# Patient Record
Sex: Female | Born: 1945
Health system: Southern US, Community
[De-identification: ages and names within clinical notes are randomized; demographics above are authoritative.]

## PROBLEM LIST (undated history)

## (undated) DIAGNOSIS — M1990 Unspecified osteoarthritis, unspecified site: Secondary | ICD-10-CM

## (undated) DIAGNOSIS — Z5189 Encounter for other specified aftercare: Secondary | ICD-10-CM

## (undated) DIAGNOSIS — K5900 Constipation, unspecified: Secondary | ICD-10-CM

## (undated) DIAGNOSIS — I712 Thoracic aortic aneurysm, without rupture, unspecified: Secondary | ICD-10-CM

## (undated) DIAGNOSIS — J189 Pneumonia, unspecified organism: Secondary | ICD-10-CM

## (undated) DIAGNOSIS — D649 Anemia, unspecified: Secondary | ICD-10-CM

## (undated) DIAGNOSIS — I10 Essential (primary) hypertension: Secondary | ICD-10-CM

## (undated) DIAGNOSIS — I359 Nonrheumatic aortic valve disorder, unspecified: Secondary | ICD-10-CM

## (undated) HISTORY — DX: Thoracic aortic aneurysm, without rupture, unspecified: I71.20

## (undated) HISTORY — DX: Encounter for other specified aftercare: Z51.89

## (undated) HISTORY — DX: Nonrheumatic aortic valve disorder, unspecified: I35.9

## (undated) HISTORY — DX: Thoracic aortic aneurysm, without rupture: I71.2

## (undated) HISTORY — DX: Essential (primary) hypertension: I10

## (undated) HISTORY — PX: COLONOSCOPY: SHX174

---

## 1974-10-03 HISTORY — PX: TUBAL LIGATION: SHX77

## 1998-02-04 ENCOUNTER — Other Ambulatory Visit: Admission: RE | Admit: 1998-02-04 | Discharge: 1998-02-04 | Payer: Self-pay | Admitting: Emergency Medicine

## 1998-02-06 ENCOUNTER — Other Ambulatory Visit: Admission: RE | Admit: 1998-02-06 | Discharge: 1998-02-06 | Payer: Self-pay | Admitting: Emergency Medicine

## 1999-02-09 ENCOUNTER — Other Ambulatory Visit: Admission: RE | Admit: 1999-02-09 | Discharge: 1999-02-09 | Payer: Self-pay | Admitting: Emergency Medicine

## 2000-03-13 ENCOUNTER — Encounter: Admission: RE | Admit: 2000-03-13 | Discharge: 2000-03-13 | Payer: Self-pay | Admitting: Emergency Medicine

## 2000-03-13 ENCOUNTER — Encounter: Payer: Self-pay | Admitting: Emergency Medicine

## 2000-08-29 ENCOUNTER — Other Ambulatory Visit: Admission: RE | Admit: 2000-08-29 | Discharge: 2000-08-29 | Payer: Self-pay | Admitting: Emergency Medicine

## 2001-04-18 ENCOUNTER — Encounter: Admission: RE | Admit: 2001-04-18 | Discharge: 2001-04-18 | Payer: Self-pay | Admitting: Emergency Medicine

## 2001-04-18 ENCOUNTER — Encounter: Payer: Self-pay | Admitting: Emergency Medicine

## 2002-04-22 ENCOUNTER — Encounter: Admission: RE | Admit: 2002-04-22 | Discharge: 2002-04-22 | Payer: Self-pay | Admitting: Emergency Medicine

## 2002-04-22 ENCOUNTER — Encounter: Payer: Self-pay | Admitting: Emergency Medicine

## 2004-02-25 ENCOUNTER — Encounter: Admission: RE | Admit: 2004-02-25 | Discharge: 2004-02-25 | Payer: Self-pay | Admitting: Emergency Medicine

## 2004-04-08 ENCOUNTER — Encounter: Payer: Self-pay | Admitting: Internal Medicine

## 2005-03-10 ENCOUNTER — Encounter: Admission: RE | Admit: 2005-03-10 | Discharge: 2005-03-10 | Payer: Self-pay | Admitting: Emergency Medicine

## 2006-03-30 ENCOUNTER — Encounter: Admission: RE | Admit: 2006-03-30 | Discharge: 2006-03-30 | Payer: Self-pay | Admitting: Emergency Medicine

## 2007-04-05 ENCOUNTER — Encounter: Admission: RE | Admit: 2007-04-05 | Discharge: 2007-04-05 | Payer: Self-pay | Admitting: Emergency Medicine

## 2007-04-09 ENCOUNTER — Encounter: Admission: RE | Admit: 2007-04-09 | Discharge: 2007-04-09 | Payer: Self-pay | Admitting: Emergency Medicine

## 2007-08-10 DIAGNOSIS — I517 Cardiomegaly: Secondary | ICD-10-CM

## 2007-09-04 ENCOUNTER — Encounter: Admission: RE | Admit: 2007-09-04 | Discharge: 2007-09-04 | Payer: Self-pay | Admitting: Emergency Medicine

## 2007-09-05 DIAGNOSIS — D179 Benign lipomatous neoplasm, unspecified: Secondary | ICD-10-CM | POA: Insufficient documentation

## 2007-09-12 ENCOUNTER — Ambulatory Visit: Payer: Self-pay | Admitting: Hematology and Oncology

## 2007-09-13 ENCOUNTER — Encounter: Admission: RE | Admit: 2007-09-13 | Discharge: 2007-09-13 | Payer: Self-pay | Admitting: Emergency Medicine

## 2007-09-14 ENCOUNTER — Ambulatory Visit: Payer: Self-pay | Admitting: Internal Medicine

## 2007-09-14 LAB — CONVERTED CEMR LAB
ALT: 49 units/L — ABNORMAL HIGH (ref 0–35)
Albumin: 2.5 g/dL — ABNORMAL LOW (ref 3.5–5.2)
Alkaline Phosphatase: 159 units/L — ABNORMAL HIGH (ref 39–117)
Basophils Absolute: 0.1 10*3/uL (ref 0.0–0.1)
Basophils Relative: 0.8 % (ref 0.0–1.0)
Eosinophils Relative: 2.7 % (ref 0.0–5.0)
HCT: 32.6 % — ABNORMAL LOW (ref 36.0–46.0)
Hemoglobin: 10.8 g/dL — ABNORMAL LOW (ref 12.0–15.0)
INR: 1.1 — ABNORMAL HIGH (ref 0.8–1.0)
MCHC: 33.2 g/dL (ref 30.0–36.0)
MCV: 80.7 fL (ref 78.0–100.0)
Neutro Abs: 14.4 10*3/uL — ABNORMAL HIGH (ref 1.4–7.7)
Prothrombin Time: 12.6 s (ref 10.9–13.3)
Total Bilirubin: 0.7 mg/dL (ref 0.3–1.2)
WBC: 17.4 10*3/uL — ABNORMAL HIGH (ref 4.5–10.5)

## 2007-09-19 LAB — CBC WITH DIFFERENTIAL/PLATELET
BASO%: 0.3 % (ref 0.0–2.0)
EOS%: 3.5 % (ref 0.0–7.0)
Eosinophils Absolute: 0.4 10*3/uL (ref 0.0–0.5)
HCT: 28.1 % — ABNORMAL LOW (ref 34.8–46.6)
HGB: 9.4 g/dL — ABNORMAL LOW (ref 11.6–15.9)
MCHC: 33.3 g/dL (ref 32.0–36.0)
MCV: 78.7 fL — ABNORMAL LOW (ref 81.0–101.0)
MONO#: 0.7 10*3/uL (ref 0.1–0.9)
NEUT#: 8.2 10*3/uL — ABNORMAL HIGH (ref 1.5–6.5)
NEUT%: 78.5 % — ABNORMAL HIGH (ref 39.6–76.8)
Platelets: 541 10*3/uL — ABNORMAL HIGH (ref 145–400)
RBC: 3.57 10*6/uL — ABNORMAL LOW (ref 3.70–5.32)
RDW: 15.9 % — ABNORMAL HIGH (ref 11.3–14.5)

## 2007-09-19 LAB — IRON AND TIBC
%SAT: 9 % — ABNORMAL LOW (ref 20–55)
Iron: 18 ug/dL — ABNORMAL LOW (ref 42–145)

## 2007-09-19 LAB — MORPHOLOGY: PLT EST: INCREASED

## 2007-09-19 LAB — FERRITIN: Ferritin: 915 ng/mL — ABNORMAL HIGH (ref 10–291)

## 2007-09-21 DIAGNOSIS — I10 Essential (primary) hypertension: Secondary | ICD-10-CM

## 2007-09-21 DIAGNOSIS — R5383 Other fatigue: Secondary | ICD-10-CM

## 2007-09-21 DIAGNOSIS — R945 Abnormal results of liver function studies: Secondary | ICD-10-CM

## 2007-09-21 DIAGNOSIS — B2790 Infectious mononucleosis, unspecified without complication: Secondary | ICD-10-CM

## 2007-09-21 DIAGNOSIS — R5381 Other malaise: Secondary | ICD-10-CM

## 2007-09-21 LAB — PROTEIN ELECTROPHORESIS, SERUM
Albumin ELP: 37.7 % — ABNORMAL LOW (ref 55.8–66.1)
Alpha-2-Globulin: 19.4 % — ABNORMAL HIGH (ref 7.1–11.8)
Gamma Globulin: 18.5 % (ref 11.1–18.8)
Total Protein, Serum Electrophoresis: 6.9 g/dL (ref 6.0–8.3)

## 2007-09-21 LAB — COMPREHENSIVE METABOLIC PANEL
BUN: 13 mg/dL (ref 6–23)
CO2: 26 mEq/L (ref 19–32)
Calcium: 9.1 mg/dL (ref 8.4–10.5)

## 2007-09-28 ENCOUNTER — Ambulatory Visit: Payer: Self-pay | Admitting: Internal Medicine

## 2007-10-04 DIAGNOSIS — Z5189 Encounter for other specified aftercare: Secondary | ICD-10-CM

## 2007-10-04 HISTORY — DX: Encounter for other specified aftercare: Z51.89

## 2007-11-28 ENCOUNTER — Ambulatory Visit: Payer: Self-pay | Admitting: Internal Medicine

## 2007-11-28 LAB — CONVERTED CEMR LAB
Basophils Absolute: 0.1 10*3/uL (ref 0.0–0.1)
Eosinophils Absolute: 0.6 10*3/uL (ref 0.0–0.6)
Eosinophils Relative: 6.8 % — ABNORMAL HIGH (ref 0.0–5.0)
MCHC: 31.5 g/dL (ref 30.0–36.0)
MCV: 77.6 fL — ABNORMAL LOW (ref 78.0–100.0)
Monocytes Relative: 8.4 % (ref 3.0–11.0)
Neutro Abs: 5.8 10*3/uL (ref 1.4–7.7)
Neutrophils Relative %: 60 % (ref 43.0–77.0)

## 2007-11-29 ENCOUNTER — Encounter: Payer: Self-pay | Admitting: Internal Medicine

## 2007-11-29 ENCOUNTER — Ambulatory Visit: Payer: Self-pay | Admitting: Internal Medicine

## 2007-12-07 ENCOUNTER — Ambulatory Visit: Payer: Self-pay | Admitting: Internal Medicine

## 2007-12-13 ENCOUNTER — Ambulatory Visit: Payer: Self-pay | Admitting: Internal Medicine

## 2007-12-13 LAB — CONVERTED CEMR LAB
Basophils Relative: 0.9 % (ref 0.0–1.0)
Eosinophils Absolute: 0.3 10*3/uL (ref 0.0–0.6)
HCT: 31.5 % — ABNORMAL LOW (ref 36.0–46.0)
Lymphocytes Relative: 19.4 % (ref 12.0–46.0)
MCV: 78.2 fL (ref 78.0–100.0)
Monocytes Absolute: 0.5 10*3/uL (ref 0.2–0.7)
Neutro Abs: 5.2 10*3/uL (ref 1.4–7.7)
RBC: 4.02 M/uL (ref 3.87–5.11)

## 2007-12-26 ENCOUNTER — Encounter (HOSPITAL_COMMUNITY): Admission: RE | Admit: 2007-12-26 | Discharge: 2008-03-25 | Payer: Self-pay | Admitting: Internal Medicine

## 2008-01-30 ENCOUNTER — Ambulatory Visit: Payer: Self-pay | Admitting: Internal Medicine

## 2008-02-01 LAB — CONVERTED CEMR LAB
Eosinophils Absolute: 0.3 10*3/uL (ref 0.0–0.7)
HCT: 33.5 % — ABNORMAL LOW (ref 36.0–46.0)
Hemoglobin: 10.9 g/dL — ABNORMAL LOW (ref 12.0–15.0)
Lymphocytes Relative: 25.4 % (ref 12.0–46.0)
Monocytes Absolute: 0.6 10*3/uL (ref 0.1–1.0)
Neutro Abs: 4.4 10*3/uL (ref 1.4–7.7)
Platelets: 269 10*3/uL (ref 150–400)
RBC: 4.14 M/uL (ref 3.87–5.11)
RDW: 18.4 % — ABNORMAL HIGH (ref 11.5–14.6)
WBC: 7.3 10*3/uL (ref 4.5–10.5)

## 2008-04-07 ENCOUNTER — Ambulatory Visit: Payer: Self-pay | Admitting: Internal Medicine

## 2008-04-07 LAB — CONVERTED CEMR LAB
Lymphocytes Relative: 22.3 % (ref 12.0–46.0)
MCV: 85.2 fL (ref 78.0–100.0)
Platelets: 234 10*3/uL (ref 150–400)
RBC: 4.23 M/uL (ref 3.87–5.11)
WBC: 6.8 10*3/uL (ref 4.5–10.5)

## 2008-04-10 ENCOUNTER — Encounter: Admission: RE | Admit: 2008-04-10 | Discharge: 2008-04-10 | Payer: Self-pay | Admitting: Emergency Medicine

## 2008-07-10 ENCOUNTER — Ambulatory Visit: Payer: Self-pay | Admitting: Internal Medicine

## 2008-07-10 LAB — CONVERTED CEMR LAB
Basophils Relative: 1.3 % (ref 0.0–3.0)
HCT: 38.8 % (ref 36.0–46.0)
Hemoglobin: 13.2 g/dL (ref 12.0–15.0)
MCHC: 34 g/dL (ref 30.0–36.0)
MCV: 85.1 fL (ref 78.0–100.0)
Neutrophils Relative %: 59.8 % (ref 43.0–77.0)
Platelets: 229 10*3/uL (ref 150–400)

## 2009-04-10 ENCOUNTER — Encounter: Admission: RE | Admit: 2009-04-10 | Discharge: 2009-04-10 | Payer: Self-pay | Admitting: Internal Medicine

## 2009-06-17 ENCOUNTER — Encounter: Admission: RE | Admit: 2009-06-17 | Discharge: 2009-06-17 | Payer: Self-pay | Admitting: Internal Medicine

## 2010-01-14 DIAGNOSIS — D126 Benign neoplasm of colon, unspecified: Secondary | ICD-10-CM | POA: Insufficient documentation

## 2010-01-14 DIAGNOSIS — I1310 Hypertensive heart and chronic kidney disease without heart failure, with stage 1 through stage 4 chronic kidney disease, or unspecified chronic kidney disease: Secondary | ICD-10-CM | POA: Insufficient documentation

## 2010-01-14 DIAGNOSIS — M1990 Unspecified osteoarthritis, unspecified site: Secondary | ICD-10-CM | POA: Insufficient documentation

## 2010-04-12 ENCOUNTER — Encounter: Admission: RE | Admit: 2010-04-12 | Discharge: 2010-04-12 | Payer: Self-pay | Admitting: Internal Medicine

## 2011-02-15 NOTE — Assessment & Plan Note (Signed)
[facility]                         GASTROENTEROLOGY OFFICE NOTE   MALALA, TRENKAMP                         MRN:          [id number]  DATE:09/14/2007                            DOB:          1946-03-29    REFERRING PHYSICIAN:  Dr. Lorenz Coaster.   PROBLEMS:  1. Elevated liver function studies.  2. Severe fatigue.   HISTORY:  Shalaina is a pleasant 65 year old African-American female. She is  known remotely to Dr. Lina Sar who did screening colonoscopy in 2005.  This was a normal exam. The patient is generally healthy. She does have  a history of hypertension and is status post laparoscopic  cholecystectomy.   The patient states that her current illness started about six weeks ago  with onset of fatigue. She was then seen by Dr. Lorenz Coaster and says that she  was initially given a treatment of antibiotics, which did not help her  symptoms. She had returned and was then told that she had a pneumonia.  She was treated with a course of Avelox and then a Z-Pak. She says that  after that, she has continued to feel poorly. She has had severe fatigue  limiting her daily activities, anorexia, and a weight loss of about 16  pounds since the onset of her symptoms. She has had some mild nausea,  but no vomiting, and says that she tries to force herself to eat. She  says that she has no taste.   On further questioning, she also says that she has had a coating on her  tongue. She has not had any documented fever or chills. She has had  headaches over the past month and occasional night sweats. She denies  any myalgias or arthralgias. She did have a cough and apparently some  sputum production several weeks ago which has resolved for the most  part. She denies any dysphagia or odynophagia currently. She says that  she has some tenderness in her neck anteriorly on the right and says  that she has trouble because the food gets bigger in her mouth, but no  actual trouble  with deglutition or dysphagia. She has not had any  diarrhea, melena, or hematochezia. She has had undergone a CT scan of  the abdomen and pelvis on 09/05/2007. She has what appears to be a lipoma  beneath the right sternocleidomastoid muscle. A CT of the abdomen and  pelvis was negative. Chest x-ray on 11/7 was negative. The chest x-ray  on 11/7 showed mild cardiomegaly, small bilateral effusions and basilar  infiltrates.   LABORATORY DATA:  12/2 WBC 14, hemoglobin 11.5, hematocrit 35.3, MCV 78.  She had 70% neutrophils. Lymphocyte count was normal. Creatinine 1.02.  Electrolytes within normal limits. Total bilirubin was 0.6, alkaline  phosphatase 249, SGOT 101, SGPT 131. She had hepatitis C serology done  which was negative. CMV serology was negative. Hepatitis B core antibody  negative. Epstein bar viral titers were done and were positive for the  EBV antibody , VCA-IgG, at 1403 and EBV nuclear antigen antibody IgG at  584. The EBV antibody, VCA-IgM, was 7  and the early antigen IgG was 53  and both of those were normal. This pattern is consistent with a  convalescent phase of EBV.   CURRENT MEDICATIONS:  1. Hydrochlorothiazide 25 daily.  2. Meclizine 12.5 t.i.d. p.r.n.  3. Ibuprofen 800 t.i.d. p.r.n.   ALLERGIES:  CODEINE.   PAST HISTORY:  Pertinent for, again, hypertension, bilateral tubal  ligation.   FAMILY HISTORY:  Negative for GI disease. Specifically negative for  liver disease.   SOCIAL HISTORY:  The patient is married. She has one child and lives  with her spouse. She is a Futures trader. She quit smoking in November 2008.  No ETOH.   REVIEW OF SYSTEMS:  As outlined above in detail.   PHYSICAL EXAMINATION:  GENERAL:  Well developed, thin African-American  female in no acute distress. She is fatigued appearing.  VITAL SIGNS:  Height 5 foot 3 inches, weight 140 pounds, blood pressure  98/60, pulse 84.  HEENT:  Normocephalic atraumatic. EOMI. PERRLA. Sclerae anicteric.   Oropharynx is clear. She has some mild pharyngeal erythema and the  tongue with a greenish-yellowish plaque.  NECK:  Supple. She does have a soft mass effect in the right anterior  cervical region, which may be the lipoma that was mentioned on the CT  scan. She also has some tenderness in the submandibular node area.  CARDIOVASCULAR:  Regular rate and rhythm with S1 and S2. No murmurs,  rubs, or gallops.  PULMONARY:  Clear to auscultation and percussion.  ABDOMEN:  Soft and nontender. There is no mass or hepatosplenomegaly.  RECTAL:  Not done.  EXTREMITIES:  Without cyanosis, clubbing, or edema. There is no rash or  excoriations.  NEUROLOGIC:  Non-focal.   IMPRESSION:  1. This is a 65 year old African-American female with a six week      history of fatigue, anorexia, weight loss, headaches, and elevated      LFTs with  EBV viral serologies now positive for a probable      convalescent phase of an active EBV infection.  2. Recent pneumonia, probably associated.   PLAN:  1. The patient appears to have an EBV hepatitis and treatment is      supportive management at this time which was discussed with the      patient. We will repeat her hepatic panel today and we will plan to      see her back in the office in approximately two weeks to repeat      liver tests again at that point. She was advised that it may take      several more weeks for her symptoms to improve and start clearing.  2. Possible oral candidiasis versus leukoplakia associated with the      EBV. She was given      Mycostatin oral suspension, 5 cc swish and swallow q.i.d. x2 weeks.      Hesitant to use Diflucan at this time with the active hepatitis.      Mike Gip, PA-C  Electronically Signed      Iva Boop, MD,FACG  Electronically Signed   AE/MedQ  DD: 09/14/2007  DT: 09/16/2007  Job #: [id number]   cc:   Reuben Likes, M.D.

## 2011-02-15 NOTE — Assessment & Plan Note (Signed)
[facility]                         GASTROENTEROLOGY OFFICE NOTE   GARNETTA, FEDRICK                         MRN:          [id number]  DATE:09/28/2007                            DOB:          09-20-1946    CHIEF COMPLAINT:  Followup of abnormal LFTs.   Ms. Gilbertson was seen by Mike Gip, PA-C on December 12.  She had some  abnormal LFTs.  It was the conclusion then given the overall clinical  situation (see that note) that she had resolving EBV hepatitis and viral  syndrome.  She is feeling better, gaining weight.  No more severe  fatigue, though she is having some exertional fatigue.  She saw Dr.  Dalene Carrow and was told her white blood cell count was okay, and she  believes her liver chemistries had normalized since our visit on  December 12.  Her medications are listed and reviewed in the chart  today, and include ibuprofen and centrum Silver.  The ibuprofen is  p.r.n.   PAST MEDICAL HISTORY:  Reviewed and unchanged.   VITAL SIGNS:  Weight 144 pounds, pulse 100, blood pressure 122/74.   ASSESSMENT:  This certainly looks like she has resolving Epstein-Barr  virus hepatitis.  The serologies indicated that her liver test  abnormalities were minimal at the time when we saw her on December 12.  She had a normal INR.  She had an alkaline phosphatase of 159 and an ALT  of 49.  Her white blood cell count was 17,000 that day with a left  shift, though she did not have atypical at that time.   PLAN:  1. Request copies of labs from Dr. Dalene Carrow.  2. Keep followup with Dr. Lorenz Coaster later on this month.  She certainly      should have repeat LFTs at that time to confirm that they were      normal.  3. If she has persistently abnormal LFTs, I would be happy to see her      back to consider further investigation, but would not do so at this      time.     Iva Boop, MD,FACG  Electronically Signed    CEG/MedQ  DD: 09/28/2007  DT: 09/28/2007  Job #:  [id number]   cc:   Reuben Likes, M.D.  Lauretta I. Odogwu, M.D.

## 2011-02-15 NOTE — Assessment & Plan Note (Signed)
[facility]                         GASTROENTEROLOGY OFFICE NOTE   MAURY, BAMBA                         MRN:          [id number]  DATE:11/28/2007                            DOB:          31-Aug-1946    Ms. Nancy Montoya is a 65 year old female referred by Dr. Lorenz Coaster because of iron  deficiency anemia, which was found on October 20, 2007.Her Hemoglobin  was 8.6, hematocrit 25.9 and her MCV 78.  Her BUN was normal at 13.  Ms.  Ledonne was seen by [place] in the past for screening colonoscopy at the  request of Dr. Lorenz Coaster.  Her colonoscopy on April 08, 2004 was a normal  exam without evidence of colon polyps.  Ms. Abila has been taking  ibuprofen 800 mg twice a day for a long time for right shoulder  discomfort and arthritis, which is work related.  She denies any  melena,but her stools recently have been dark due to her iron  supplements.  She denies any abdominal pain, indigestion. There has been  20 pound weight loss since she had a pneumonia and upper respiratory  infection  in November 2008.  She was on multiple antibiotics and was  feeling quite poorly for about two and a half months.  She has only now  started to maintain her weight and has gained one pound.   PHYSICAL EXAMINATION:  Blood pressure 110/76, pulse 76 and weight 142.2  pounds.  She was alert, oriented, in no distress.  The sclerae were  nonicteric.  Her oral cavity showed normal papillated tongue.  No  cheilosis.  LUNGS:  Clear to auscultation.  COR:  Normal S1, S2.  ABDOMEN:  Soft with tenderness in epigastrium in the midline.  No  tenderness in left or right upper quadrant or lower abdomen.  Bowel  sounds were normoactive.  There was no distention.  Liver edge at costal  margin.  RECTAL:  Soft Hemoccult negative stool.   IMPRESSION:  65 year old female with epigastric discomfort who  is on large doses of ibuprofen on a chronic basis, and has iron  deficiency anemia with microcytic  indices.  She had a normal colonoscopy  three and a half years ago.  I think that we are dealing with an NSAID  related gastropathy causing chronic low-grade GI blood loss.  She is  postmenopausal.  Her last period was about 13 years ago, so her blood  loss is most likely GI related.   PLAN:  1. Upper endoscopy scheduled.  2. Discontinue ibuprofen.  Patient said it is no problem for her to      stop taking it.  3. May take Tylenol p.r.n.  4. Pepcid 40 mg p.o. daily prescribed to [facility].  She      will be on it probably for several weeks just to heal the      gastropathy.  Depending on the upper endoscopy, we will      decide if she needs colonoscopy, but unless she has completely      normal upper endoscopic exam, I would not  think that the repeat      colonoscopy is indicated yet.     Hedwig Morton. Juanda Chance, MD  Electronically Signed    DMB/MedQ  DD: 11/28/2007  DT: 11/29/2007  Job #: [id number]   cc:   Reuben Likes, M.D.

## 2011-04-29 ENCOUNTER — Other Ambulatory Visit: Payer: Self-pay | Admitting: Internal Medicine

## 2011-04-29 DIAGNOSIS — Z1231 Encounter for screening mammogram for malignant neoplasm of breast: Secondary | ICD-10-CM

## 2011-05-02 ENCOUNTER — Ambulatory Visit
Admission: RE | Admit: 2011-05-02 | Discharge: 2011-05-02 | Disposition: A | Discharge status: Home / Self Care | Payer: Medicare Other | Source: Ambulatory Visit | Attending: Internal Medicine | Admitting: Internal Medicine

## 2011-05-02 DIAGNOSIS — Z1231 Encounter for screening mammogram for malignant neoplasm of breast: Secondary | ICD-10-CM

## 2012-05-15 ENCOUNTER — Other Ambulatory Visit: Payer: Self-pay | Admitting: Internal Medicine

## 2012-05-15 DIAGNOSIS — Z1231 Encounter for screening mammogram for malignant neoplasm of breast: Secondary | ICD-10-CM

## 2012-05-18 DIAGNOSIS — I10 Essential (primary) hypertension: Secondary | ICD-10-CM | POA: Diagnosis not present

## 2012-05-24 DIAGNOSIS — Z1212 Encounter for screening for malignant neoplasm of rectum: Secondary | ICD-10-CM | POA: Diagnosis not present

## 2012-05-31 ENCOUNTER — Ambulatory Visit
Admission: RE | Admit: 2012-05-31 | Discharge: 2012-05-31 | Disposition: A | Discharge status: Home / Self Care | Payer: Medicare Other | Source: Ambulatory Visit | Attending: Internal Medicine | Admitting: Internal Medicine

## 2012-05-31 DIAGNOSIS — Z1231 Encounter for screening mammogram for malignant neoplasm of breast: Secondary | ICD-10-CM

## 2012-05-31 DIAGNOSIS — M949 Disorder of cartilage, unspecified: Secondary | ICD-10-CM | POA: Diagnosis not present

## 2012-05-31 DIAGNOSIS — Z124 Encounter for screening for malignant neoplasm of cervix: Secondary | ICD-10-CM | POA: Diagnosis not present

## 2012-05-31 DIAGNOSIS — M899 Disorder of bone, unspecified: Secondary | ICD-10-CM | POA: Insufficient documentation

## 2012-05-31 DIAGNOSIS — Z0000 Encounter for general adult medical examination without abnormal findings: Secondary | ICD-10-CM | POA: Diagnosis not present

## 2012-05-31 DIAGNOSIS — I10 Essential (primary) hypertension: Secondary | ICD-10-CM | POA: Diagnosis not present

## 2012-05-31 DIAGNOSIS — M1990 Unspecified osteoarthritis, unspecified site: Secondary | ICD-10-CM | POA: Diagnosis not present

## 2012-05-31 DIAGNOSIS — Z9189 Other specified personal risk factors, not elsewhere classified: Secondary | ICD-10-CM | POA: Diagnosis not present

## 2012-06-05 DIAGNOSIS — I10 Essential (primary) hypertension: Secondary | ICD-10-CM | POA: Diagnosis not present

## 2012-06-05 DIAGNOSIS — J069 Acute upper respiratory infection, unspecified: Secondary | ICD-10-CM | POA: Diagnosis not present

## 2012-06-05 DIAGNOSIS — J029 Acute pharyngitis, unspecified: Secondary | ICD-10-CM | POA: Diagnosis not present

## 2012-06-18 DIAGNOSIS — J069 Acute upper respiratory infection, unspecified: Secondary | ICD-10-CM | POA: Diagnosis not present

## 2012-06-27 DIAGNOSIS — M949 Disorder of cartilage, unspecified: Secondary | ICD-10-CM | POA: Diagnosis not present

## 2012-06-27 DIAGNOSIS — M899 Disorder of bone, unspecified: Secondary | ICD-10-CM | POA: Diagnosis not present

## 2012-06-27 DIAGNOSIS — Z23 Encounter for immunization: Secondary | ICD-10-CM | POA: Diagnosis not present

## 2012-06-28 DIAGNOSIS — H25019 Cortical age-related cataract, unspecified eye: Secondary | ICD-10-CM | POA: Diagnosis not present

## 2012-11-20 DIAGNOSIS — S51809A Unspecified open wound of unspecified forearm, initial encounter: Secondary | ICD-10-CM | POA: Diagnosis not present

## 2012-12-04 ENCOUNTER — Encounter: Payer: Self-pay | Admitting: Internal Medicine

## 2012-12-14 ENCOUNTER — Encounter: Payer: Self-pay | Admitting: Internal Medicine

## 2012-12-25 ENCOUNTER — Encounter: Payer: Self-pay | Admitting: Internal Medicine

## 2013-01-24 ENCOUNTER — Ambulatory Visit (AMBULATORY_SURGERY_CENTER): Payer: Medicare Other | Admitting: *Deleted

## 2013-01-24 VITALS — Ht 63.0 in | Wt 167.0 lb

## 2013-01-24 DIAGNOSIS — Z1211 Encounter for screening for malignant neoplasm of colon: Secondary | ICD-10-CM

## 2013-01-24 MED ORDER — MOVIPREP 100 G PO SOLR: ORAL | Status: DC

## 2013-02-06 ENCOUNTER — Ambulatory Visit (AMBULATORY_SURGERY_CENTER): Payer: Medicare Other | Admitting: Internal Medicine

## 2013-02-06 ENCOUNTER — Encounter: Payer: Self-pay | Admitting: Internal Medicine

## 2013-02-06 VITALS — BP 120/71 | HR 63 | Temp 97.8°F | Resp 19 | Ht 63.0 in | Wt 167.0 lb

## 2013-02-06 DIAGNOSIS — Z1211 Encounter for screening for malignant neoplasm of colon: Secondary | ICD-10-CM | POA: Diagnosis not present

## 2013-02-06 DIAGNOSIS — I517 Cardiomegaly: Secondary | ICD-10-CM | POA: Diagnosis not present

## 2013-02-06 DIAGNOSIS — I10 Essential (primary) hypertension: Secondary | ICD-10-CM | POA: Diagnosis not present

## 2013-02-06 MED ORDER — SODIUM CHLORIDE 0.9 % IV SOLN: 500.0000 mL | INTRAVENOUS | Status: DC

## 2013-02-06 NOTE — Op Note (Signed)
[facility] [address] [place], [address]   COLONOSCOPY PROCEDURE REPORT  PATIENT: Nancy Montoya, Nancy Montoya  MR#: [id number] BIRTHDATE: 1946-05-06 , 67  yrs. old GENDER: Female ENDOSCOPIST: Hart Carwin, MD REFERRED BY:  Guerry Bruin, M.D. PROCEDURE DATE:  02/06/2013 PROCEDURE:   Colonoscopy, screening ASA CLASS:   Class II INDICATIONS:colon 2005, adenomatous polyp on 2009,. MEDICATIONS: propofol (Diprivan) 250mg  IV  DESCRIPTION OF PROCEDURE:   After the risks and benefits and of the procedure were explained, informed consent was obtained.  A digital rectal exam revealed no abnormalities of the rectum.    The LB [id number]-[id number]  endoscope was introduced through the anus and advanced to the cecum, which was identified by both the appendix and ileocecal valve .  The quality of the prep was good, using MoviPrep .  The instrument was then slowly withdrawn as the colon was fully examined.     COLON FINDINGS: A normal appearing cecum, ileocecal valve, and appendiceal orifice were identified.  The ascending, hepatic flexure, transverse, splenic flexure, descending, sigmoid colon and rectum appeared unremarkable.  No polyps or cancers were seen. Retroflexed views revealed no abnormalities.     The scope was then withdrawn from the patient and the procedure completed.  COMPLICATIONS: There were no complications. ENDOSCOPIC IMPRESSION: Normal colon  RECOMMENDATIONS: High fiber diet   REPEAT EXAM: In 10 year(s)  for Colonoscopy.  cc:  _______________________________ eSignedHart Carwin, MD 02/06/2013 9:45 AM

## 2013-02-06 NOTE — Progress Notes (Signed)
9604 pt. Began vomiting, vomited up two whole hycosamine tablet.  Passing large amounts of gas with vomiting.  Vomited moderate Amount of clear liquid.  Notices a little relief with passage of air.

## 2013-02-06 NOTE — Progress Notes (Signed)
1024 pt. Has passed good amounts of gas and states she is feeling better. Is preparing for discharge;Patient did not experience any of the following events: a burn prior to discharge; a fall within the facility; wrong site/side/patient/procedure/implant event; or a hospital transfer or hospital admission upon discharge from the facility. ([id number])Patient did not have preoperative order for IV antibiotic SSI prophylaxis. [phone])

## 2013-02-06 NOTE — Patient Instructions (Addendum)

## 2013-02-07 ENCOUNTER — Telehealth: Payer: Self-pay

## 2013-02-07 NOTE — Telephone Encounter (Signed)
  Follow up Call-  Call back number 02/06/2013  Post procedure Call Back phone  # [phone]  Permission to leave phone message Yes     Patient questions:  Do you have a fever, pain , or abdominal swelling? no Pain Score  0 *  Have you tolerated food without any problems? yes  Have you been able to return to your normal activities? yes  Do you have any questions about your discharge instructions: Diet   no Medications  no Follow up visit  no  Do you have questions or concerns about your Care? no  Actions: * If pain score is 4 or above: No action needed, pain <4.   No problems per the pt.  I left a message on her cell phone voice mail first.  Then I saw a note on the pt's report to call her hm [phone] if unable to reach her on her cell.  I called her home and spoke with the pt. Maw

## 2013-05-21 ENCOUNTER — Other Ambulatory Visit: Payer: Self-pay

## 2013-05-21 DIAGNOSIS — Z1231 Encounter for screening mammogram for malignant neoplasm of breast: Secondary | ICD-10-CM

## 2013-06-04 DIAGNOSIS — E559 Vitamin D deficiency, unspecified: Secondary | ICD-10-CM | POA: Diagnosis not present

## 2013-06-04 DIAGNOSIS — R82998 Other abnormal findings in urine: Secondary | ICD-10-CM | POA: Diagnosis not present

## 2013-06-04 DIAGNOSIS — I10 Essential (primary) hypertension: Secondary | ICD-10-CM | POA: Diagnosis not present

## 2013-06-07 DIAGNOSIS — Z1212 Encounter for screening for malignant neoplasm of rectum: Secondary | ICD-10-CM | POA: Diagnosis not present

## 2013-06-10 ENCOUNTER — Ambulatory Visit
Admission: RE | Admit: 2013-06-10 | Discharge: 2013-06-10 | Disposition: A | Discharge status: Home / Self Care | Payer: Medicare Other | Source: Ambulatory Visit

## 2013-06-10 DIAGNOSIS — Z1231 Encounter for screening mammogram for malignant neoplasm of breast: Secondary | ICD-10-CM | POA: Diagnosis not present

## 2013-06-11 DIAGNOSIS — Z1331 Encounter for screening for depression: Secondary | ICD-10-CM | POA: Diagnosis not present

## 2013-06-11 DIAGNOSIS — D126 Benign neoplasm of colon, unspecified: Secondary | ICD-10-CM | POA: Diagnosis not present

## 2013-06-11 DIAGNOSIS — E559 Vitamin D deficiency, unspecified: Secondary | ICD-10-CM | POA: Insufficient documentation

## 2013-06-11 DIAGNOSIS — M899 Disorder of bone, unspecified: Secondary | ICD-10-CM | POA: Diagnosis not present

## 2013-06-11 DIAGNOSIS — I10 Essential (primary) hypertension: Secondary | ICD-10-CM | POA: Diagnosis not present

## 2013-06-11 DIAGNOSIS — Z0000 Encounter for general adult medical examination without abnormal findings: Secondary | ICD-10-CM | POA: Diagnosis not present

## 2013-06-11 DIAGNOSIS — Z6828 Body mass index (BMI) 28.0-28.9, adult: Secondary | ICD-10-CM | POA: Diagnosis not present

## 2013-06-11 DIAGNOSIS — M1990 Unspecified osteoarthritis, unspecified site: Secondary | ICD-10-CM | POA: Diagnosis not present

## 2013-07-12 DIAGNOSIS — Z23 Encounter for immunization: Secondary | ICD-10-CM | POA: Diagnosis not present

## 2013-10-01 DIAGNOSIS — H40059 Ocular hypertension, unspecified eye: Secondary | ICD-10-CM | POA: Diagnosis not present

## 2013-10-07 DIAGNOSIS — L259 Unspecified contact dermatitis, unspecified cause: Secondary | ICD-10-CM | POA: Diagnosis not present

## 2013-10-07 DIAGNOSIS — H01009 Unspecified blepharitis unspecified eye, unspecified eyelid: Secondary | ICD-10-CM | POA: Diagnosis not present

## 2014-05-08 ENCOUNTER — Other Ambulatory Visit: Payer: Self-pay

## 2014-05-08 DIAGNOSIS — Z1231 Encounter for screening mammogram for malignant neoplasm of breast: Secondary | ICD-10-CM

## 2014-06-12 ENCOUNTER — Ambulatory Visit: Payer: Medicare Other

## 2014-06-12 ENCOUNTER — Ambulatory Visit
Admission: RE | Admit: 2014-06-12 | Discharge: 2014-06-12 | Disposition: A | Discharge status: Home / Self Care | Payer: Medicare Other | Source: Ambulatory Visit

## 2014-06-12 DIAGNOSIS — Z1231 Encounter for screening mammogram for malignant neoplasm of breast: Secondary | ICD-10-CM

## 2014-07-01 DIAGNOSIS — E559 Vitamin D deficiency, unspecified: Secondary | ICD-10-CM | POA: Diagnosis not present

## 2014-07-01 DIAGNOSIS — M949 Disorder of cartilage, unspecified: Secondary | ICD-10-CM | POA: Diagnosis not present

## 2014-07-01 DIAGNOSIS — M899 Disorder of bone, unspecified: Secondary | ICD-10-CM | POA: Diagnosis not present

## 2014-07-01 DIAGNOSIS — I10 Essential (primary) hypertension: Secondary | ICD-10-CM | POA: Diagnosis not present

## 2014-07-01 DIAGNOSIS — Z23 Encounter for immunization: Secondary | ICD-10-CM | POA: Diagnosis not present

## 2014-07-08 DIAGNOSIS — Z23 Encounter for immunization: Secondary | ICD-10-CM | POA: Diagnosis not present

## 2014-07-08 DIAGNOSIS — Z6827 Body mass index (BMI) 27.0-27.9, adult: Secondary | ICD-10-CM | POA: Diagnosis not present

## 2014-07-08 DIAGNOSIS — M1990 Unspecified osteoarthritis, unspecified site: Secondary | ICD-10-CM | POA: Diagnosis not present

## 2014-07-08 DIAGNOSIS — I10 Essential (primary) hypertension: Secondary | ICD-10-CM | POA: Diagnosis not present

## 2014-07-08 DIAGNOSIS — Z0000 Encounter for general adult medical examination without abnormal findings: Secondary | ICD-10-CM | POA: Diagnosis not present

## 2014-07-08 DIAGNOSIS — M8580 Other specified disorders of bone density and structure, unspecified site: Secondary | ICD-10-CM | POA: Diagnosis not present

## 2014-07-08 DIAGNOSIS — E559 Vitamin D deficiency, unspecified: Secondary | ICD-10-CM | POA: Diagnosis not present

## 2014-07-08 DIAGNOSIS — Z1212 Encounter for screening for malignant neoplasm of rectum: Secondary | ICD-10-CM | POA: Diagnosis not present

## 2014-09-15 DIAGNOSIS — H40053 Ocular hypertension, bilateral: Secondary | ICD-10-CM | POA: Diagnosis not present

## 2015-05-29 ENCOUNTER — Other Ambulatory Visit: Payer: Self-pay

## 2015-05-29 ENCOUNTER — Other Ambulatory Visit: Payer: Self-pay | Admitting: Internal Medicine

## 2015-05-29 DIAGNOSIS — Z1231 Encounter for screening mammogram for malignant neoplasm of breast: Secondary | ICD-10-CM

## 2015-06-15 ENCOUNTER — Ambulatory Visit
Admission: RE | Admit: 2015-06-15 | Discharge: 2015-06-15 | Disposition: A | Discharge status: Home / Self Care | Payer: PPO | Source: Ambulatory Visit

## 2015-06-15 DIAGNOSIS — Z1231 Encounter for screening mammogram for malignant neoplasm of breast: Secondary | ICD-10-CM

## 2015-07-28 DIAGNOSIS — Z0000 Encounter for general adult medical examination without abnormal findings: Secondary | ICD-10-CM | POA: Insufficient documentation

## 2016-02-18 DIAGNOSIS — H40053 Ocular hypertension, bilateral: Secondary | ICD-10-CM | POA: Diagnosis not present

## 2016-03-31 DIAGNOSIS — Z6827 Body mass index (BMI) 27.0-27.9, adult: Secondary | ICD-10-CM | POA: Diagnosis not present

## 2016-03-31 DIAGNOSIS — L259 Unspecified contact dermatitis, unspecified cause: Secondary | ICD-10-CM | POA: Diagnosis not present

## 2016-05-12 ENCOUNTER — Other Ambulatory Visit: Payer: Self-pay | Admitting: Internal Medicine

## 2016-05-12 DIAGNOSIS — Z1231 Encounter for screening mammogram for malignant neoplasm of breast: Secondary | ICD-10-CM

## 2016-06-16 ENCOUNTER — Ambulatory Visit
Admission: RE | Admit: 2016-06-16 | Discharge: 2016-06-16 | Disposition: A | Discharge status: Home / Self Care | Payer: PPO | Source: Ambulatory Visit | Attending: Internal Medicine | Admitting: Internal Medicine

## 2016-06-16 DIAGNOSIS — Z1231 Encounter for screening mammogram for malignant neoplasm of breast: Secondary | ICD-10-CM

## 2016-08-04 DIAGNOSIS — N390 Urinary tract infection, site not specified: Secondary | ICD-10-CM | POA: Diagnosis not present

## 2016-08-04 DIAGNOSIS — E559 Vitamin D deficiency, unspecified: Secondary | ICD-10-CM | POA: Diagnosis not present

## 2016-08-04 DIAGNOSIS — R8299 Other abnormal findings in urine: Secondary | ICD-10-CM | POA: Diagnosis not present

## 2016-08-04 DIAGNOSIS — I10 Essential (primary) hypertension: Secondary | ICD-10-CM | POA: Diagnosis not present

## 2016-08-08 DIAGNOSIS — Z1212 Encounter for screening for malignant neoplasm of rectum: Secondary | ICD-10-CM | POA: Diagnosis not present

## 2016-08-11 ENCOUNTER — Other Ambulatory Visit: Payer: Self-pay | Admitting: Internal Medicine

## 2016-08-11 DIAGNOSIS — Z124 Encounter for screening for malignant neoplasm of cervix: Secondary | ICD-10-CM | POA: Diagnosis not present

## 2016-08-11 DIAGNOSIS — R221 Localized swelling, mass and lump, neck: Secondary | ICD-10-CM

## 2016-08-11 DIAGNOSIS — D126 Benign neoplasm of colon, unspecified: Secondary | ICD-10-CM | POA: Diagnosis not present

## 2016-08-11 DIAGNOSIS — M1990 Unspecified osteoarthritis, unspecified site: Secondary | ICD-10-CM | POA: Diagnosis not present

## 2016-08-11 DIAGNOSIS — I10 Essential (primary) hypertension: Secondary | ICD-10-CM | POA: Diagnosis not present

## 2016-08-11 DIAGNOSIS — Z1231 Encounter for screening mammogram for malignant neoplasm of breast: Secondary | ICD-10-CM | POA: Diagnosis not present

## 2016-08-11 DIAGNOSIS — M859 Disorder of bone density and structure, unspecified: Secondary | ICD-10-CM | POA: Diagnosis not present

## 2016-08-11 DIAGNOSIS — E559 Vitamin D deficiency, unspecified: Secondary | ICD-10-CM | POA: Diagnosis not present

## 2016-08-11 DIAGNOSIS — D692 Other nonthrombocytopenic purpura: Secondary | ICD-10-CM | POA: Insufficient documentation

## 2016-08-11 DIAGNOSIS — Z0000 Encounter for general adult medical examination without abnormal findings: Secondary | ICD-10-CM | POA: Diagnosis not present

## 2016-08-11 DIAGNOSIS — Z23 Encounter for immunization: Secondary | ICD-10-CM | POA: Diagnosis not present

## 2016-08-11 DIAGNOSIS — Z6826 Body mass index (BMI) 26.0-26.9, adult: Secondary | ICD-10-CM | POA: Diagnosis not present

## 2016-08-16 ENCOUNTER — Other Ambulatory Visit: Payer: PPO

## 2016-08-19 ENCOUNTER — Ambulatory Visit
Admission: RE | Admit: 2016-08-19 | Discharge: 2016-08-19 | Disposition: A | Discharge status: Home / Self Care | Payer: PPO | Source: Ambulatory Visit | Attending: Internal Medicine | Admitting: Internal Medicine

## 2016-08-19 DIAGNOSIS — R221 Localized swelling, mass and lump, neck: Secondary | ICD-10-CM | POA: Diagnosis not present

## 2016-08-23 ENCOUNTER — Other Ambulatory Visit: Payer: Self-pay | Admitting: Internal Medicine

## 2016-08-23 DIAGNOSIS — R221 Localized swelling, mass and lump, neck: Secondary | ICD-10-CM

## 2016-08-31 ENCOUNTER — Other Ambulatory Visit: Payer: PPO

## 2016-09-06 ENCOUNTER — Ambulatory Visit
Admission: RE | Admit: 2016-09-06 | Discharge: 2016-09-06 | Disposition: A | Discharge status: Home / Self Care | Payer: PPO | Source: Ambulatory Visit | Attending: Internal Medicine | Admitting: Internal Medicine

## 2016-09-06 DIAGNOSIS — R221 Localized swelling, mass and lump, neck: Secondary | ICD-10-CM | POA: Diagnosis not present

## 2016-09-06 MED ORDER — IOPAMIDOL (ISOVUE-300) INJECTION 61%: 75.0000 mL | Freq: Once | INTRAVENOUS | Status: DC | PRN

## 2016-09-12 ENCOUNTER — Other Ambulatory Visit: Payer: Self-pay | Admitting: *Deleted

## 2016-09-12 ENCOUNTER — Institutional Professional Consult (permissible substitution) (INDEPENDENT_AMBULATORY_CARE_PROVIDER_SITE_OTHER): Payer: PPO | Admitting: Surgery

## 2016-09-12 ENCOUNTER — Encounter: Payer: Self-pay | Admitting: Surgery

## 2016-09-12 VITALS — BP 159/79 | HR 79 | Resp 20 | Ht 63.0 in | Wt 150.0 lb

## 2016-09-12 DIAGNOSIS — I712 Thoracic aortic aneurysm, without rupture, unspecified: Secondary | ICD-10-CM

## 2016-09-12 DIAGNOSIS — I714 Abdominal aortic aneurysm, without rupture, unspecified: Secondary | ICD-10-CM

## 2016-09-12 DIAGNOSIS — I7121 Aneurysm of the ascending aorta, without rupture: Secondary | ICD-10-CM

## 2016-09-12 DIAGNOSIS — I359 Nonrheumatic aortic valve disorder, unspecified: Secondary | ICD-10-CM

## 2016-09-12 NOTE — Progress Notes (Signed)
Cardiothoracic Surgery Consultation   PCP is Haywood Pao, MD Referring Provider is Tisovec, Fransico Him, MD  Chief Complaint  Patient presents with  . Thoracic Aortic Aneurysm    Surgical eval, CT Nect 09/06/16, NO CTA Chest has been ordered for done    HPI:  The patient is a 70 year old woman with hypertension and a right neck lipoma who was recently evaluated by her PCP and it was felt that the neck mass may be larger. She says she doesn't really think about it. An ultrasound showed it measured 6.4 cm and a CT was recommended. A CT of the neck showed that it was a 7.2 cm right neck lipoma. It also showed a 6.1 cm distal ascending aortic aneurysm that extended out into the arch and proximal descending aorta where it was 4.5 cm. She denies any chest or back pain. She denies any neck pain. She has some mild shortness of breath with activity but none at rest. She denies peripheral edema.  Past Medical History:  Diagnosis Date  . Blood transfusion without reported diagnosis 2009   anemia  . Hypertension     Past Surgical History:  Procedure Laterality Date  . TUBAL LIGATION  1976    Family History  Problem Relation Age of Onset  . Colon cancer Neg Hx   No family history of aortic aneurysm, dissection or bicuspid valve disease. No family history of connective tissue disease.  Social History Social History  Substance Use Topics  . Smoking status: Former Smoker    Quit date: 06/22/2008  . Smokeless tobacco: Never Used  . Alcohol use No    Current Outpatient Prescriptions  Medication Sig Dispense Refill  . hydrochlorothiazide (HYDRODIURIL) 25 MG tablet Take 25 mg by mouth daily.    Marland Kitchen ibuprofen (ADVIL,MOTRIN) 800 MG tablet Take 800 mg by mouth every 8 (eight) hours as needed for pain.     No current facility-administered medications for this visit.     Allergies  Allergen Reactions  . Codeine     hallucinations    Review of Systems  Constitutional: Negative.    HENT: Negative.        Dentures  Eyes:       Floaters  Respiratory: Positive for shortness of breath. Negative for chest tightness.   Cardiovascular: Negative for chest pain, palpitations and leg swelling.  Gastrointestinal: Negative.   Endocrine: Negative.   Genitourinary: Negative.   Musculoskeletal: Negative.  Negative for back pain.  Neurological: Negative.   Hematological: Bruises/bleeds easily.  Psychiatric/Behavioral: Negative.     BP (!) 159/79   Pulse 79   Resp 20   Ht 5\' 3"  (1.6 m)   Wt 150 lb (68 kg)   SpO2 96%   BMI 26.57 kg/m  Physical Exam  Constitutional: She is oriented to person, place, and time. She appears well-developed and well-nourished. No distress.  HENT:  Head: Normocephalic and atraumatic.  Mouth/Throat: Oropharynx is clear and moist.  Eyes: Conjunctivae and EOM are normal. Pupils are equal, round, and reactive to light.  Neck: Normal range of motion. Neck supple. No JVD present.  Soft palpable mass in right neck beneath SCM muscle.  Cardiovascular: Normal rate, regular rhythm and intact distal pulses.   Murmur heard. 2/6 diastolic murmur along LSB  Pulmonary/Chest: Effort normal. No respiratory distress. She has no wheezes. She has no rales.  Abdominal: Soft. Bowel sounds are normal. She exhibits no distension and no mass. There is no tenderness.  Musculoskeletal: Normal range of motion. She exhibits no edema or tenderness.  Lymphadenopathy:    She has no cervical adenopathy.  Neurological: She is alert and oriented to person, place, and time. She has normal strength. No cranial nerve deficit or sensory deficit.  Skin: Skin is warm and dry.  Psychiatric: She has a normal mood and affect.    Diagnostic Tests:  CLINICAL DATA:  Right anterior neck mass.  EXAM: CT NECK WITH CONTRAST  TECHNIQUE: Multidetector CT imaging of the neck was performed using the standard protocol following the bolus administration of  intravenous contrast.  CONTRAST:  75 mL Isovue-300  COMPARISON:  Neck ultrasound 08/19/2016  FINDINGS: Pharynx and larynx: No pharyngeal mass or inflammatory change. Unremarkable larynx.  Salivary glands: No inflammation, mass, or stone.  Thyroid: Unremarkable.  Lymph nodes: No enlarged lymph nodes are identified in the neck.  Vascular: Major vascular structures of the neck appear patent.  Limited intracranial: Unremarkable.  Visualized orbits: Unremarkable.  Mastoids and visualized paranasal sinuses: Clear.  Skeleton: Advanced right facet arthrosis at C4-5 with trace anterolisthesis. Mild cervical disc degeneration greatest at C5-6.  Upper chest: Partially visualized thoracic aortic aneurysm. The ascending aorta measures at least 6.1 cm in diameter. The transverse aorta measures up to 4.5 cm, and the proximal descending aorta measures 4.5 cm.  Other: Corresponding to the palpable abnormality in the right neck is a homogeneous fat density mass. This measures 5.0 x 2.9 x 7.2 cm and is located deep/medial to the right sternocleidomastoid muscle and anterior to the right thyroid lobe and carotid space vessels. There is mild mass effect on the adjacent structures. No enhancing mass component is seen. A vein courses through the mass.  IMPRESSION: 1. 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 2. At least 6.1 cm ascending aortic aneurysm, incompletely visualized. Recommend semi-annual imaging followup by CTA or MRA and referral to cardiothoracic surgery if not already obtained. This recommendation follows 2010 ACCF/AHA/AATS/ACR/ASA/SCA/SCAI/SIR/STS/SVM Guidelines for the Diagnosis and Management of Patients With Thoracic Aortic Disease. Circulation. 2010; 121: [id number]   Electronically Signed   By: Logan Bores M.D.   On: 09/06/2016 14:39  Impression:  This 70 year old fairly healthy and active woman has an enlarging ascending, arch and descending aortic aneurysm  with a maximal diameter of 6.1 cm on incompletely imaged CT scan of the neck. I have personally reviewed and interpreted this scan and compared it to her prior scans. She had a CT scan of the chest on 09/05/2007 that was done for other reasons and showed a 4 cm ascending aortic aneurysm. Then she had a CT of the chest on 06/17/2009 to evaluate the aorta because it was noted to be enlarged on CXR. This showed the ascending aorta to have enlarged to 4.7 cm. She denies knowing anything about this and is very surprised in the office today that she has an enlarging aneurysm. She needs a formal CTA of the chest, abdomen and pelvis to fully evaluate the size of the aorta and the extent of the aneurysm. It is at least 6.1 cm in the distal ascending region and will require surgical replacement. She also has a murmur of AI and needs an echo to evaluate this further. I reviewed all of the CT scan images with her and her husband and the indications for surgical treatment. I discussed the high risk of aortic dissection and rupture without surgery and the importance of good blood pressure control. I reviewed surgical treatment with them and the possibilities depending on  the results of further workup. I outlined the further workup including CTA of the chest, abdomen and pelvis, 2D-echo and then cardiology evaluation and cath. I have answered all of their questions.   Plan:  She will have the CTA scans and echo done and will then return to see me to discuss the results and make further surgical plans.  I spent 60 minutes performing this consultation and > 50% of this time was spent face to face counseling and coordinating the care of this patient's aortic aneurysm.  Gaye Pollack, MD [facility] [phone]

## 2016-09-21 ENCOUNTER — Ambulatory Visit (HOSPITAL_COMMUNITY)
Admission: RE | Admit: 2016-09-21 | Discharge: 2016-09-21 | Disposition: A | Discharge status: Home / Self Care | Payer: PPO | Source: Ambulatory Visit | Attending: Surgery | Admitting: Surgery

## 2016-09-21 DIAGNOSIS — I359 Nonrheumatic aortic valve disorder, unspecified: Secondary | ICD-10-CM

## 2016-09-21 DIAGNOSIS — I351 Nonrheumatic aortic (valve) insufficiency: Secondary | ICD-10-CM | POA: Diagnosis not present

## 2016-09-21 DIAGNOSIS — I119 Hypertensive heart disease without heart failure: Secondary | ICD-10-CM | POA: Diagnosis not present

## 2016-09-21 DIAGNOSIS — I371 Nonrheumatic pulmonary valve insufficiency: Secondary | ICD-10-CM | POA: Insufficient documentation

## 2016-09-21 DIAGNOSIS — I340 Nonrheumatic mitral (valve) insufficiency: Secondary | ICD-10-CM | POA: Diagnosis not present

## 2016-09-21 DIAGNOSIS — I77819 Aortic ectasia, unspecified site: Secondary | ICD-10-CM | POA: Diagnosis not present

## 2016-09-21 NOTE — Progress Notes (Signed)
  Echocardiogram 2D Echocardiogram has been performed.  Nancy Montoya 09/21/2016, 2:40 PM

## 2016-09-28 ENCOUNTER — Ambulatory Visit
Admission: RE | Admit: 2016-09-28 | Discharge: 2016-09-28 | Disposition: A | Discharge status: Home / Self Care | Payer: PPO | Source: Ambulatory Visit | Attending: Surgery | Admitting: Surgery

## 2016-09-28 ENCOUNTER — Encounter: Payer: PPO | Admitting: Surgery

## 2016-09-28 ENCOUNTER — Encounter: Payer: Self-pay | Admitting: Surgery

## 2016-09-28 ENCOUNTER — Ambulatory Visit (INDEPENDENT_AMBULATORY_CARE_PROVIDER_SITE_OTHER): Payer: PPO | Admitting: Surgery

## 2016-09-28 VITALS — BP 140/83 | HR 88 | Resp 20 | Ht 63.0 in | Wt 150.0 lb

## 2016-09-28 DIAGNOSIS — I712 Thoracic aortic aneurysm, without rupture, unspecified: Secondary | ICD-10-CM

## 2016-09-28 DIAGNOSIS — I7121 Aneurysm of the ascending aorta, without rupture: Secondary | ICD-10-CM

## 2016-09-28 DIAGNOSIS — I714 Abdominal aortic aneurysm, without rupture, unspecified: Secondary | ICD-10-CM

## 2016-09-28 MED ORDER — IOPAMIDOL (ISOVUE-370) INJECTION 76%: 100.0000 mL | Freq: Once | INTRAVENOUS | Status: DC | PRN

## 2016-09-29 ENCOUNTER — Telehealth: Payer: Self-pay | Admitting: *Deleted

## 2016-09-29 NOTE — Telephone Encounter (Signed)
Received message that pt needs heart cath prior to upcoming surgery by Dr. Cyndia Bent.  Dr. Angelena Form would like to see pt in office on January 4,2018 to arrange cath.  I spoke with pt and scheduled appt with Dr. Angelena Form for January 4,2018 at 9:30.  Pt asked me to call her husband at home with office information as she cannot write it down at current time.  I spoke with pt and gave him appt information and office location.

## 2016-09-30 ENCOUNTER — Encounter: Payer: Self-pay | Admitting: Surgery

## 2016-09-30 NOTE — Progress Notes (Signed)
HPI:  The patient returns today to review the results of her recent CTA of the chest, abdomen and pelvis and 2D echo in preparation for surgical repair of a large ascending and arch aneurysm. She is a 70 year old woman with hypertension and a right neck lipoma who was recently evaluated by her PCP and it was felt that the neck mass may be larger. She says she doesn't really think about it. An ultrasound showed it measured 6.4 cm and a CT was recommended. A CT of the neck showed that it was a 7.2 cm right neck lipoma. It also showed a 6.1 cm distal ascending aortic aneurysm that extended out into the arch and proximal descending aorta where it was 4.5 cm. Her CTA shows aneurysmal dilation of the entire thoracic aorta with the ascending aorta measuring 6.2 cm. It was 3.8 cm on the previous CT in 2008. The aortic arch measures 4.3 cm and was 3.5 cm previously. The descending aorta is 4.2 cm and was 3.0 cm previously. There is mild supra-renal aneurysmal dilation to 3.3 cm. Her echo shows a trileaflet aortic valve with moderate AI and normal LV function.  Current Outpatient Prescriptions  Medication Sig Dispense Refill  . hydrochlorothiazide (HYDRODIURIL) 25 MG tablet Take 25 mg by mouth daily.    Marland Kitchen ibuprofen (ADVIL,MOTRIN) 800 MG tablet Take 800 mg by mouth every 8 (eight) hours as needed for pain.     No current facility-administered medications for this visit.      Physical Exam: BP 140/83   Pulse 88   Resp 20   Ht 5\' 3"  (1.6 m)   Wt 150 lb (68 kg)   SpO2 96% Comment: RA  BMI 26.57 kg/m  She looks well Cardiac exam shows a regular rate and rhythm with a 2/6 diastolic murmur along the LSB. Lungs are clear  Diagnostic Tests:  CLINICAL DATA:  Thoracic aortic aneurysm.  Pre stent evaluation.  EXAM: CT ANGIOGRAPHY CHEST, ABDOMEN AND PELVIS  TECHNIQUE: Multidetector CT imaging through the chest, abdomen and pelvis was performed using the standard protocol during bolus  administration of intravenous contrast. Multiplanar reconstructed images and MIPs were obtained and reviewed to evaluate the vascular anatomy.  CONTRAST:  100 cc Isovue 370  COMPARISON:  CT the chest, abdomen and pelvis - 09/05/2007  FINDINGS: CTA CHEST FINDINGS  Cardiovascular: Interval aneurysmal dilatation of the ascending thoracic aorta, aortic arch and descending thoracic aorta with measurements as follows. Scattered minimal amount of mixed calcified and noncalcified atherosclerotic plaque within the descending thoracic aorta, not resulting in hemodynamically significant stenosis. There is mild unfolding of the descending thoracic aorta. No definite evidence of thoracic aortic dissection on this nongated examination. No definitive aortic wall thickening or periaortic stranding.  The ascending thoracic aorta results in mass effect upon the left innominate vein with associated development of several hypertrophy mediastinal venous collaterals but without frank occlusion.  Conventional configuration of the aortic arch. The branch vessels of the aortic arch appear widely patent throughout their imaged course.  Cardiomegaly. Coronary artery calcifications. No pericardial effusion. Note is again made of an approximately 3.6 x 2.7 cm hypoattenuating (17 Hounsfield unit) right-sided pericardial cyst (image 85, series 4), similar to the 09/2007 examination.  Mediastinum/Nodes: No mediastinal, hilar axillary lymphadenopathy.  Lungs/Pleura: Grossly unchanged approximately 5.3 x 3.5 cm right middle lobe bulla. Minimal subsegmental axis about the aneurysmal segment of the descending thoracic aorta. Minimal dependent subpleural ground-glass atelectasis. Minimal subsegmental atelectasis in the left costophrenic  angle. No discrete focal airspace opacities. No pleural effusion or pneumothorax. The central pulmonary airways appear widely patent.  No discrete pulmonary  nodules.  Musculoskeletal: No acute or aggressive osseous abnormalities.  -------------------------------------------------------------  Thoracic aortic measurements:  Sinotubular junction  35 mm as measured in greatest oblique coronal dimension (coronal image 59, series 603), previously, 32 mm  Proximal ascending aorta  62 mm as measured in greatest oblique axial dimension at the level of the main pulmonary artery (image 57, series 4), previously, 38 mm  Aortic arch aorta  43 mm as measured in greatest oblique sagittal dimension (sagittal image 85, series 604), previously, 35 mm.  Proximal descending thoracic aorta  42 mm as measured in greatest oblique axial dimension at the level of the main pulmonary artery (image 57, series 4), previously, 30 mm  Distal descending thoracic aorta  34 mm as measured in greatest oblique axial dimension at the level of the diaphragmatic hiatus (image 105, series 4), previously, 29 mm  Review of the MIP images confirms the above findings.  __________________________________________________________  CTA ABDOMEN AND PELVIS FINDINGS  VASCULAR  Aorta: Mild fusiform aneurysmal dilatation of the suprarenal abdominal aorta measuring approximately 3.3 cm at the level between the origins of the celiac and superior mesenteric arteries. The abdominal aorta tapers to a normal caliber below level of the renal artery's. Scattered minimal amount of atherosclerotic plaque within the abdominal aorta, not resulting in hemodynamically significant stenosis. No abdominal aortic dissection or periaortic stranding.  Celiac: There is mild tortuosity involving the origin the celiac artery, not resulting in hemodynamically significant stenosis.  SMA: Widely patent without hemodynamically significant narrowing. Conventional branching pattern. The distal tributaries of the SMA are widely patent without discrete remote filling defect to  suggest distal embolism.  Renals: Solitary bilaterally. The bilateral renal artery is widely patent without hemodynamically significant narrowing. No vessel irregularity to suggest FMD.  IMA: Widely patent without hemodynamically significant stenosis.  Inflow: The bilateral common, external and internal iliac artery is are normal caliber and widely patent without hemodynamically significant stenosis.  Veins: The IVC and pelvic veins appear patent on this CTA examination.  Review of the MIP images confirms the above findings.  NON-VASCULAR  Evaluation of the abdominal organs is largely limited to the arterial phase of enhancement.  Hepatobiliary: Normal hepatic contour. No discrete hepatic lesions. Normal appearance of the gallbladder given degree distention. No radiopaque gallstones. No intra extrahepatic bili duct dilatation. No ascites.  Pancreas: Note is made of at least 2 punctate (subcentimeter) lipoma is within the body of an otherwise normal-appearing pancreas. No pancreatic atrophy or pancreatic duct dilatation.  Spleen: Normal appearance of the spleen.  Adrenals/Urinary Tract: There is symmetric enhancement of the bilateral kidneys. Multiple renal cysts are seen bilaterally with dominant exophytic cyst arising from the inferior pole the left kidney measuring 6.4 cm in diameter and dominant exophytic cyst arising from the interpolar aspect of the right kidney measuring 2.4 cm. Additional bilateral subcentimeter hypoattenuating lesions are too small to adequately characterize of favored to represent additional renal cysts. No definite renal stones on this postcontrast examination. No urinary obstruction or perinephric stranding.  Normal appearance of the bilateral adrenal glands.  Normal appearance of the urinary bladder given underdistention.  Stomach/Bowel: Scattered colonic diverticulosis without evidence of diverticulitis. Moderate colonic  stool burden without evidence of enteric obstruction. Normal appearance of the terminal ileum and appendix. No pneumoperitoneum, pneumatosis or portal venous gas.  Lymphatic: No bulky retroperitoneal, mesenteric, pelvic or inguinal lymphadenopathy.  Reproductive:  Normal appearance of the pelvic organs. No free fluid in the pelvic cul-de-sac.  Other: Regional soft tissues appear normal.  Musculoskeletal: No acute or aggressive osseous abnormalities. Mild-to-moderate multilevel lumbar spine DDD, worse at L2-L3 with disc space height loss, endplate irregularity and sclerosis.  Review of the MIP images confirms the above findings.  IMPRESSION: Vascular Impression:  1. Interval aneurysmal dilatation of the entirety of the thoracic aorta with the ascending thoracic aorta measuring 62 mm in diameter, previously, 38 mm. No evidence of thoracic aortic dissection or periaortic stranding. 2. The thoracic aortic aneurysm extends to involve the suprarenal abdominal aorta, measuring approximately 3.3 cm between the takeoff of the SMA and celiac arteries. The abdominal aorta tapers to a normal caliber below the level of the renal arteries. 3. Coronary artery calcifications. Aortic Atherosclerosis (ICD10-170.0) Nonvascular Impression:  1. No acute findings within the chest, abdomen or pelvis. 2. Multiple bilateral renal cysts. 3. Colonic diverticulosis without evidence of diverticulitis. 4. Additional ancillary findings as above.   Electronically Signed   By: Sandi Mariscal M.D.   On: 09/28/2016 13:15             *[place], [place] [address]                            [phone]  ------------------------------------------------------------------- Transthoracic Echocardiography  (Report amended )  Patient:    Nancy, Montoya MR #:        [id number] Study Date: 09/21/2016 Gender:     F Age:        54 Height:     160 cm Weight:     68 kg BSA:        1.76 m^2 Pt. Status: Room:   ATTENDING    Gilford Raid, MD  [place], MD  [place], MD  PERFORMING   [facility], Outpatient  SONOGRAPHER  Roseanna Rainbow  cc:  ------------------------------------------------------------------- LV EF: 60% -   65%  ------------------------------------------------------------------- Indications:      424.1 Aortic valve disorders.  ------------------------------------------------------------------- History:   PMH:  Dilated aorta.  Cardiomegaly.  Risk factors: Hypertension.  ------------------------------------------------------------------- Study Conclusions  - Left ventricle: The cavity size was normal. Wall thickness was   increased in a pattern of mild LVH. Systolic function was normal.   The estimated ejection fraction was in the range of 60% to 65%.   Wall motion was normal; there were no regional wall motion   abnormalities. Doppler parameters are consistent with abnormal   left ventricular relaxation (grade 1 diastolic dysfunction). The   E/e&' ratio is between 8-15, suggesting indeterminate LV filling   pressure. - Aortic valve: Trileaflet. There was moderate regurgitation.   Regurgitation pressure half-time: 342 ms. - Aorta: Severely dilated ascending aorta up to 6.2 cm. No evidence   for dissection. The proximal descending aorta measures 4.4 cm.   The mid-descending aorta measures 3.1 cm. - Mitral valve: Mildly thickened leaflets . There was mild   regurgitation. - Left  atrium: The atrium was normal in size. - Tricuspid valve: There was trivial regurgitation. - Pulmonic valve: There was mild regurgitation. - Pulmonary arteries: PA peak pressure: 22 mm Hg (S). - Inferior vena cava: The IVC measures <2.1 cm, but does not   collapse >50%, suggesting an elevated RA pressure of 8  mmHg.  Impressions:  - LVEF 60-65%, mild LVH, normal wall motion, diastolic dyfunction,   indeterminate LV filling pressure, severely dilated ascending   aorta up to 6.2 cm, the proximal descending aorta measures 4.4 cm   and the mid-descending aorta measures 3.1 cm, normal LA size,   trivial TR, mild PI, normal RVSP, elevated RA pressure of 8   mmHg,. no pericardial effusion. The patient reports she has been   seen by Dr. Cyndia Bent with CT surgery and has a follow-up next week   - findings are similar to that seen by CT of the neck.  ------------------------------------------------------------------- Study data:  No prior study was available for comparison.  Study status:  Routine.  Procedure:  The patient reported no pain pre or post test. Transthoracic echocardiography. Image quality was adequate.  Study completion:  There were no complications. Transthoracic echocardiography.  M-mode, complete 2D, spectral Doppler, and color Doppler.  Birthdate:  Patient birthdate: 12-03-1945.  Age:  Patient is 70 yr old.  Sex:  Gender: female. BMI: 26.6 kg/m^2.  Blood pressure:     129/84  Patient status: Outpatient.  Study date:  Study date: 09/21/2016. Study time: 01:47 PM.  Location:  Echo laboratory.  -------------------------------------------------------------------  ------------------------------------------------------------------- Left ventricle:  The cavity size was normal. Wall thickness was increased in a pattern of mild LVH. Systolic function was normal. The estimated ejection fraction was in the range of 60% to 65%. Wall motion was normal; there were no regional wall motion abnormalities. Doppler parameters are consistent with abnormal left ventricular relaxation (grade 1 diastolic dysfunction). The E/e&' ratio is between 8-15, suggesting indeterminate LV filling pressure.  ------------------------------------------------------------------- Aortic valve:   Trileaflet.   Doppler:  There was moderate regurgitation.  ------------------------------------------------------------------- Aorta:  Severely dilated ascending aorta up to 6.2 cm. No evidence for dissection. The proximal descending aorta measures 4.4 cm. The mid-descending aorta measures 3.1 cm.  ------------------------------------------------------------------- Mitral valve:   Mildly thickened leaflets .  Doppler:  There was mild regurgitation.    Peak gradient (D): 2 mm Hg.  ------------------------------------------------------------------- Left atrium:  The atrium was normal in size.  ------------------------------------------------------------------- Right ventricle:  The cavity size was normal. Wall thickness was normal. Systolic function was normal.  ------------------------------------------------------------------- Pulmonic valve:    The valve appears to be grossly normal. Doppler:  There was mild regurgitation.  ------------------------------------------------------------------- Tricuspid valve:   Doppler:  There was trivial regurgitation.  ------------------------------------------------------------------- Pulmonary artery:   The main pulmonary artery was normal-sized.  ------------------------------------------------------------------- Right atrium:  The atrium was normal in size.  ------------------------------------------------------------------- Pericardium:  There was no pericardial effusion.  ------------------------------------------------------------------- Systemic veins: Inferior vena cava: The IVC measures <2.1 cm, but does not collapse >50%, suggesting an elevated RA pressure of 8 mmHg.  ------------------------------------------------------------------- Post procedure conclusions Ascending Aorta:  - Severely dilated ascending aorta up to 6.2 cm. No evidence for   dissection. The proximal descending aorta measures 4.4 cm. The   mid-descending  aorta measures 3.1 cm.  ------------------------------------------------------------------- Measurements   Left ventricle                          Value  Reference  LV ID, ED, PLAX chordal          (L)    31.6  mm     43 - 52  LV ID, ES, PLAX chordal                 23.6  mm     23 - 38  LV fx shortening, PLAX chordal   (L)    25    %      >=29  LV PW thickness, ED                     11.5  mm     ----------  IVS/LV PW ratio, ED                     1.16         <=1.3  Stroke volume, 2D                       66    ml     ----------  Stroke volume/bsa, 2D                   38    ml/m^2 ----------  LV e&', lateral                          7.51  cm/s   ----------  LV E/e&', lateral                        9.43         ----------  LV e&', medial                           6.53  cm/s   ----------  LV E/e&', medial                         10.84        ----------  LV e&', average                          7.02  cm/s   ----------  LV E/e&', average                        10.09        ----------  Longitudinal strain, TDI                19    %      ----------    Ventricular septum                      Value        Reference  IVS thickness, ED                       13.3  mm     ----------    LVOT                                    Value        Reference  LVOT ID, S  16    mm     ----------  LVOT area                               2.01  cm^2   ----------  LVOT peak velocity, S                   128   cm/s   ----------  LVOT mean velocity, S                   82.4  cm/s   ----------  LVOT VTI, S                             33    cm     ----------  LVOT peak gradient, S                   7     mm Hg  ----------    Aortic valve                            Value        Reference  Aortic regurg pressure half-time        342   ms     ----------    Left atrium                             Value        Reference  LA ID, A-P, ES                          15    mm      ----------  LA ID/bsa, A-P                          0.85  cm/m^2 <=2.2  LA volume, S                            55.8  ml     ----------  LA volume/bsa, S                        31.8  ml/m^2 ----------  LA volume, ES, 1-p A4C                  49.7  ml     ----------  LA volume/bsa, ES, 1-p A4C              28.3  ml/m^2 ----------  LA volume, ES, 1-p A2C                  53.6  ml     ----------  LA volume/bsa, ES, 1-p A2C              30.5  ml/m^2 ----------    Mitral valve                            Value        Reference  Mitral E-wave peak velocity             70.8  cm/s   ----------  Mitral A-wave peak velocity             83.9  cm/s   ----------  Mitral deceleration time         (H)    275   ms     150 - 230  Mitral peak gradient, D                 2     mm Hg  ----------  Mitral E/A ratio, peak                  0.8          ----------    Pulmonary arteries                      Value        Reference  PA pressure, S, DP                      22    mm Hg  <=30    Tricuspid valve                         Value        Reference  Tricuspid regurg peak velocity          217   cm/s   ----------  Tricuspid peak RV-RA gradient           19    mm Hg  ----------    Right atrium                            Value        Reference  RA ID, S-I, ES, A4C                     44.3  mm     34 - 49  RA area, ES, A4C                        14    cm^2   8.3 - 19.5  RA volume, ES, A/L                      36.6  ml     ----------  RA volume/bsa, ES, A/L                  20.9  ml/m^2 ----------    Systemic veins                          Value        Reference  Estimated CVP                           3     mm Hg  ----------    Right ventricle                         Value        Reference  RV ID, ED, PLAX                         23.5  mm     19 - 38  TAPSE  25.6  mm     ----------  RV pressure, S, DP                      22    mm Hg  <=30  RV s&', lateral, S                        11.4  cm/s   ----------  Legend: (L)  and  (H)  mark values outside specified reference range.  ------------------------------------------------------------------- Gaspar Skeeters MD 2017-12-20T14:54:35   Impression:  I have personally reviewed and interpreted her CTA images of the chest, abdomen and pelvis as well as her 2D echo images. She has a 6.2 cm ascending aortic aneurysm with moderate AI and the aneurysm extends throughout the aorta. The arch is large at 4.3 cm and tapers slightly just beyond the left subclavian artery before enlarging again to 4.2 cm in the proximal descending aorta. This will require a Bentall procedure using a composite bioprosthetic valve/graft with replacement of the ascending aorta and arch using an elephant trunk graft to allow stent grafting of the descending aorta aneurysm in the future if it enlarges further. She will require grafts to the arch vessels. She will require right axillary artery cannulation and bilateral radial arterial lines for hemodynamic monitoring. I reviewed the CT scan images and echo findings with her and her husband and answered their questions. I discussed the operative procedure with the patient and her husband including alternatives, benefits and risks; including but not limited to bleeding, blood transfusion, infection, stroke, myocardial infarction, graft failure, heart block requiring a permanent pacemaker, organ dysfunction, and death.  Francine Graven understands and agrees to proceed.    Plan:  Bentall procedure using a composite bioprosthetic valve/graft, replacement of the ascending aorta and arch using an elephant trunk graft with bypass of the arch vessels, right axillary artery cannulation. She will require catheterization by cardiology first and we will arrange that and then see her back in the office to review the results and make final surgery plans.   Gaye Pollack, MD [facility] [phone]

## 2016-10-06 ENCOUNTER — Encounter: Payer: Self-pay | Admitting: Cardiovascular Disease

## 2016-10-06 ENCOUNTER — Ambulatory Visit (INDEPENDENT_AMBULATORY_CARE_PROVIDER_SITE_OTHER): Payer: PPO | Admitting: Cardiovascular Disease

## 2016-10-06 ENCOUNTER — Encounter: Payer: Self-pay | Admitting: *Deleted

## 2016-10-06 VITALS — BP 130/70 | HR 76 | Ht 62.0 in | Wt 154.4 lb

## 2016-10-06 DIAGNOSIS — I712 Thoracic aortic aneurysm, without rupture, unspecified: Secondary | ICD-10-CM

## 2016-10-06 DIAGNOSIS — I359 Nonrheumatic aortic valve disorder, unspecified: Secondary | ICD-10-CM | POA: Diagnosis not present

## 2016-10-06 NOTE — Patient Instructions (Signed)
Medication Instructions:  Your physician recommends that you continue on your current medications as directed. Please refer to the Current Medication list given to you today.   Labwork: Lab work to be done today--BMP, CBC, PT  Testing/Procedures: Your physician has requested that you have a cardiac catheterization. Cardiac catheterization is used to diagnose and/or treat various heart conditions. Doctors may recommend this procedure for a number of different reasons. The most common reason is to evaluate chest pain. Chest pain can be a symptom of coronary artery disease (CAD), and cardiac catheterization can show whether plaque is narrowing or blocking your heart's arteries. This procedure is also used to evaluate the valves, as well as measure the blood flow and oxygen levels in different parts of your heart. For further information please visit [email or web address]. Please follow instruction sheet, as given. Scheduled for January 9,2018   Follow-Up: As needed.  Coronary Angiogram A coronary angiogram is an X-ray procedure that is used to examine the arteries in the heart. In this procedure, a dye (contrast dye) is injected through a long, thin tube (catheter). The catheter is inserted through the groin, wrist, or arm. The dye is injected into each artery, then X-rays are taken to show if there is a blockage in the arteries of the heart. This procedure can also show if you have valve disease or a disease of the aorta, and it can be used to check the overall function of your heart muscle. You may have a coronary angiogram if:  You are having chest pain, or other symptoms of angina, and you are at risk for heart disease.  You have an abnormal electrocardiogram (ECG) or stress test.  You have chest pain and heart failure.  You are having irregular heart rhythms.  You and your health care provider determine that the benefits of the test information outweigh the risks of the procedure. Let your  health care provider know about:  Any allergies you have, including allergies to contrast dye.  All medicines you are taking, including vitamins, herbs, eye drops, creams, and over-the-counter medicines.  Any problems you or family members have had with anesthetic medicines.  Any blood disorders you have.  Any surgeries you have had.  History of kidney problems or kidney failure.  Any medical conditions you have.  Whether you are pregnant or may be pregnant. What are the risks? Generally, this is a safe procedure. However, problems may occur, including:  Infection.  Allergic reaction to medicines or dyes that are used.  Bleeding from the access site or other locations.  Kidney injury, especially in people with impaired kidney function.  Stroke (rare).  Heart attack (rare).  Damage to other structures or organs.   General instructions  Ask your health care provider about:  Changing or stopping your regular medicines. This is especially important if you are taking diabetes medicines or blood thinners.  Taking medicines such as ibuprofen. These medicines can thin your blood. Do not take these medicines before your procedure if your health care provider instructs you not to, though aspirin may be recommended prior to coronary angiograms.  Plan to have someone take you home from the hospital or clinic.  You may need to have blood tests or X-rays done. What happens during the procedure?  An IV tube will be inserted into one of your veins.  You will be given one or more of the following:  A medicine to help you relax (sedative).  A medicine to numb the area where the  catheter will be inserted into an artery (local anesthetic).  To reduce your risk of infection:  Your health care team will wash or sanitize their hands.  Your skin will be washed with soap.  Hair may be removed from the area where the catheter will be inserted.  You will be connected to a  continuous ECG monitor.  The catheter will be inserted into an artery. The location may be in your groin, in your wrist, or in the fold of your arm (near your elbow).  A type of X-ray (fluoroscopy) will be used to help guide the catheter to the opening of the blood vessel that is being examined.  A dye will be injected into the catheter, and X-rays will be taken. The dye will help to show where any narrowing or blockages are located in the heart arteries.  Tell your health care provider if you have any chest pain or trouble breathing during the procedure.  If blockages are found, your health care provider may perform another procedure, such as inserting a coronary stent. The procedure may vary among health care providers and hospitals. What happens after the procedure?  After the procedure, you will need to keep the area still for a few hours, or for as long as told by your health care provider. If the procedure is done through the groin, you will be instructed to not bend and not cross your legs.  The insertion site will be checked frequently.  The pulse in your foot or wrist will be checked frequently.  You may have additional blood tests, X-rays, and a test that records the electrical activity of your heart (ECG).  Do not drive for 24 hours if you were given a sedative. Summary  A coronary angiogram is an X-ray procedure that is used to look into the arteries in the heart.  During the procedure, a dye (contrast dye) is injected through a long, thin tube (catheter). The catheter is inserted through the groin, wrist, or arm.  Tell your health care provider about any allergies you have, including allergies to contrast dye.  After the procedure, you will need to keep the area still for a few hours, or for as long as told by your health care provider. This information is not intended to replace advice given to you by your health care provider. Make sure you discuss any questions you  have with your health care provider. Document Released: 03/26/2003 Document Revised: 07/01/2016 Document Reviewed: 07/01/2016 Elsevier Interactive Patient Education  2017 [employer].   Any Other Special Instructions Will Be Listed Below (If Applicable).     If you need a refill on your cardiac medications before your next appointment, please call your pharmacy.

## 2016-10-06 NOTE — Progress Notes (Signed)
Chief Complaint  Patient presents with  . New Patient (Initial Visit)     History of Present Illness: 71 yo female with history of HTN, ascending thoracic aortic aneurysm who is here today to discuss cardiac cath which is required as part of her pre-operative workup before planned replacement of her aorta. She has been seen by Dr. Cyndia Bent in the [facility] surgery office. Her ascending aorta measures up to 6.2 cm. Echo with normal LV systolic function, trileaflet aortic valve with moderate aortic insufficiency.  She tells me today that she has no chest pain or dyspnea. No LE edema. She is active and still working.     Primary Care Physician: Haywood Pao, MD   Past Medical History:  Diagnosis Date  . Blood transfusion without reported diagnosis 2009   anemia  . Hypertension   . Thoracic aortic aneurysm without rupture [facility])     Past Surgical History:  Procedure Laterality Date  . TUBAL LIGATION  1976    Current Outpatient Prescriptions  Medication Sig Dispense Refill  . hydrochlorothiazide (HYDRODIURIL) 25 MG tablet Take 25 mg by mouth daily.    Marland Kitchen ibuprofen (ADVIL,MOTRIN) 800 MG tablet Take 800 mg by mouth every 8 (eight) hours as needed for pain.     No current facility-administered medications for this visit.     Allergies  Allergen Reactions  . Codeine     hallucinations    Social History   Social History  . Marital status: Married    Spouse name: N/A  . Number of children: 1  . Years of education: N/A   Occupational History  . Semi Retired-housework    Social History Main Topics  . Smoking status: Former Smoker    Packs/day: 0.50    Years: 40.00    Types: Cigarettes    Start date: 10/06/1968    Quit date: 06/22/2008  . Smokeless tobacco: Never Used  . Alcohol use No  . Drug use: No  . Sexual activity: Not on file   Other Topics Concern  . Not on file   Social History Narrative  . No narrative on file    Family History  Problem Relation Age of  Onset  . Colon cancer Neg Hx     Review of Systems:  As stated in the HPI and otherwise negative.   BP 130/70   Pulse 76   Ht 5\' 2"  (1.575 m)   Wt 154 lb 6.4 oz (70 kg)   BMI 28.24 kg/m   Physical Examination: General: Well developed, well nourished, NAD  HEENT: OP clear, mucus membranes moist  SKIN: warm, dry. No rashes. Neuro: No focal deficits  Musculoskeletal: Muscle strength 5/5 all ext  Psychiatric: Mood and affect normal  Neck: No JVD, no carotid bruits, no thyromegaly, no lymphadenopathy.  Lungs:Clear bilaterally, no wheezes, rhonci, crackles Cardiovascular: Regular rate and rhythm. Soft diastolic murmur. No gallops or rubs. Abdomen:Soft. Bowel sounds present. Non-tender.  Extremities: No lower extremity edema. Pulses are 2 + in the bilateral DP/PT.  CTA chest 09/28/16: 1. Interval aneurysmal dilatation of the entirety of the thoracic aorta with the ascending thoracic aorta measuring 62 mm in diameter, previously, 38 mm. No evidence of thoracic aortic dissection or periaortic stranding. 2. The thoracic aortic aneurysm extends to involve the suprarenal abdominal aorta, measuring approximately 3.3 cm between the takeoff of the SMA and celiac arteries. The abdominal aorta tapers to a normal caliber below the level of the renal arteries. 3. Coronary artery calcifications. Aortic  Atherosclerosis (ICD10-170.0)  Echo 09/21/16: Left ventricle: The cavity size was normal. Wall thickness was   increased in a pattern of mild LVH. Systolic function was normal.   The estimated ejection fraction was in the range of 60% to 65%.   Wall motion was normal; there were no regional wall motion   abnormalities. Doppler parameters are consistent with abnormal   left ventricular relaxation (grade 1 diastolic dysfunction). The   E/e&' ratio is between 8-15, suggesting indeterminate LV filling   pressure. - Aortic valve: Trileaflet. There was moderate regurgitation.   Regurgitation  pressure half-time: 342 ms. - Aorta: Severely dilated ascending aorta up to 6.2 cm. No evidence   for dissection. The proximal descending aorta measures 4.4 cm.   The mid-descending aorta measures 3.1 cm. - Mitral valve: Mildly thickened leaflets . There was mild   regurgitation. - Left atrium: The atrium was normal in size. - Tricuspid valve: There was trivial regurgitation. - Pulmonic valve: There was mild regurgitation. - Pulmonary arteries: PA peak pressure: 22 mm Hg (S). - Inferior vena cava: The IVC measures <2.1 cm, but does not   collapse >50%, suggesting an elevated RA pressure of 8 mmHg.  Impressions:  - LVEF 60-65%, mild LVH, normal wall motion, diastolic dyfunction,   indeterminate LV filling pressure, severely dilated ascending   aorta up to 6.2 cm, the proximal descending aorta measures 4.4 cm   and the mid-descending aorta measures 3.1 cm, normal LA size,   trivial TR, mild PI, normal RVSP, elevated RA pressure of 8   mmHg,. no pericardial effusion. The patient reports she has been   seen by Dr. Cyndia Bent with CT surgery and has a follow-up next week   - findings are similar to that seen by CT of the neck.  EKG:  EKG is ordered today. The ekg ordered today demonstrates NSR, rate 77 bpm. RBBB. LAFB.   Recent Labs: No results found for requested labs within last 8760 hours.   Lipid Panel No results found for: CHOL, TRIG, HDL, CHOLHDL, VLDL, LDLCALC, LDLDIRECT   Wt Readings from Last 3 Encounters:  10/06/16 154 lb 6.4 oz (70 kg)  09/28/16 150 lb (68 kg)  09/12/16 150 lb (68 kg)     Other studies Reviewed: Additional studies/ records that were reviewed today include: . Review of the above records demonstrates:    Assessment and Plan:   1. Thoracic aortic aneurysm: Planning underway per Dr. Cyndia Bent for replacement of the ascending aorta/arch and aortic valve. Will plan right and [facility] heart [facility] at [facility] on 10[facility]11/16. Risks and benefits of procedure reviewed with pt  and husband. Will get pre-cath labs today.   Current medicines are reviewed at length with the patient today.  The patient does not have concerns regarding medicines.  The following changes have been made:  no change  Labs/ tests ordered today include:   Orders Placed This Encounter  Procedures  . Basic Metabolic Panel (BMET)  . CBC w/Diff  . INR/PT  . EKG 12-Lead    Disposition:   FU with Dr. Cyndia Bent after the cardiac cath.   Signed, Lauree Chandler, MD 10/06/2016 10:15 AM    [facility] [place], [place], [place]  [address] Phone: [phone]; Fax: [phone]

## 2016-10-07 LAB — CBC WITH DIFFERENTIAL/PLATELET
BASOS: 1 %
Basophils Absolute: 0.1 10*3/uL (ref 0.0–0.2)
EOS (ABSOLUTE): 0.2 10*3/uL (ref 0.0–0.4)
EOS: 3 %
HEMATOCRIT: 39.5 % (ref 34.0–46.6)
HEMOGLOBIN: 12.9 g/dL (ref 11.1–15.9)
IMMATURE GRANULOCYTES: 0 %
Immature Grans (Abs): 0 10*3/uL (ref 0.0–0.1)
LYMPHS ABS: 1.3 10*3/uL (ref 0.7–3.1)
Lymphs: 22 %
MCH: 27.3 pg (ref 26.6–33.0)
MCHC: 32.7 g/dL (ref 31.5–35.7)
MCV: 84 fL (ref 79–97)
MONOCYTES: 7 %
MONOS ABS: 0.4 10*3/uL (ref 0.1–0.9)
Neutrophils Absolute: 4.1 10*3/uL (ref 1.4–7.0)
Neutrophils: 67 %
Platelets: 243 10*3/uL (ref 150–379)
RBC: 4.73 x10E6/uL (ref 3.77–5.28)
RDW: 14.6 % (ref 12.3–15.4)
WBC: 6 10*3/uL (ref 3.4–10.8)

## 2016-10-07 LAB — BASIC METABOLIC PANEL
BUN / CREAT RATIO: 19 (ref 12–28)
BUN: 18 mg/dL (ref 8–27)
CO2: 25 mmol/L (ref 18–29)
CREATININE: 0.95 mg/dL (ref 0.57–1.00)
Calcium: 9.9 mg/dL (ref 8.7–10.3)
Chloride: 102 mmol/L (ref 96–106)
GFR calc Af Amer: 70 mL/min/{1.73_m2} (ref >59)
GFR, EST NON AFRICAN AMERICAN: 60 mL/min/{1.73_m2} (ref >59)
GLUCOSE: 86 mg/dL (ref 65–99)
Potassium: 3.6 mmol/L (ref 3.5–5.2)
Sodium: 143 mmol/L (ref 134–144)

## 2016-10-07 LAB — PROTIME-INR
INR: 1 (ref 0.8–1.2)
PROTHROMBIN TIME: 10.5 s (ref 9.1–12.0)

## 2016-10-11 ENCOUNTER — Ambulatory Visit (HOSPITAL_COMMUNITY)
Admission: RE | Admit: 2016-10-11 | Discharge: 2016-10-11 | Disposition: A | Discharge status: Home / Self Care | Payer: PPO | Source: Ambulatory Visit | Attending: Cardiovascular Disease | Admitting: Cardiovascular Disease

## 2016-10-11 ENCOUNTER — Encounter (HOSPITAL_COMMUNITY)
Admission: RE | Disposition: A | Discharge status: Home / Self Care | Payer: Self-pay | Source: Ambulatory Visit | Attending: Cardiovascular Disease

## 2016-10-11 DIAGNOSIS — D649 Anemia, unspecified: Secondary | ICD-10-CM | POA: Diagnosis not present

## 2016-10-11 DIAGNOSIS — I712 Thoracic aortic aneurysm, without rupture, unspecified: Secondary | ICD-10-CM

## 2016-10-11 DIAGNOSIS — I10 Essential (primary) hypertension: Secondary | ICD-10-CM | POA: Diagnosis not present

## 2016-10-11 DIAGNOSIS — Z87891 Personal history of nicotine dependence: Secondary | ICD-10-CM | POA: Diagnosis not present

## 2016-10-11 DIAGNOSIS — I351 Nonrheumatic aortic (valve) insufficiency: Secondary | ICD-10-CM | POA: Insufficient documentation

## 2016-10-11 HISTORY — PX: CARDIAC CATHETERIZATION: SHX172

## 2016-10-11 LAB — POCT I-STAT 3, VENOUS BLOOD GAS (G3P V)
ACID-BASE DEFICIT: 1 mmol/L (ref 0.0–2.0)
Bicarbonate: 24 mmol/L (ref 20.0–28.0)
O2 Saturation: 67 %
PH VEN: 7.398 (ref 7.250–7.430)
TCO2: 25 mmol/L (ref 0–100)
pCO2, Ven: 38.9 mmHg — ABNORMAL LOW (ref 44.0–60.0)
pO2, Ven: 35 mmHg (ref 32.0–45.0)

## 2016-10-11 LAB — POCT I-STAT 3, ART BLOOD GAS (G3+)
ACID-BASE DEFICIT: 1 mmol/L (ref 0.0–2.0)
Bicarbonate: 23.6 mmol/L (ref 20.0–28.0)
O2 SAT: 90 %
TCO2: 25 mmol/L (ref 0–100)
pCO2 arterial: 37.3 mmHg (ref 32.0–48.0)
pH, Arterial: 7.409 (ref 7.350–7.450)
pO2, Arterial: 59 mmHg — ABNORMAL LOW (ref 83.0–108.0)

## 2016-10-11 SURGERY — RIGHT/LEFT HEART CATH AND CORONARY ANGIOGRAPHY

## 2016-10-11 MED ORDER — ASPIRIN 81 MG PO CHEW
CHEWABLE_TABLET | ORAL | Status: AC
  Filled 2016-10-11: qty 1

## 2016-10-11 MED ORDER — HEPARIN (PORCINE) IN NACL 2-0.9 UNIT/ML-% IJ SOLN
INTRAMUSCULAR | Status: AC
  Filled 2016-10-11: qty 1000

## 2016-10-11 MED ORDER — IOPAMIDOL (ISOVUE-370) INJECTION 76%
INTRAVENOUS | Status: AC
  Filled 2016-10-11: qty 100

## 2016-10-11 MED ORDER — MIDAZOLAM HCL 2 MG/2ML IJ SOLN
INTRAMUSCULAR | Status: DC | PRN
  Administered 2016-10-11: 2 mg via INTRAVENOUS

## 2016-10-11 MED ORDER — HEPARIN (PORCINE) IN NACL 2-0.9 UNIT/ML-% IJ SOLN
INTRAMUSCULAR | Status: DC | PRN
  Administered 2016-10-11: 1000 mL

## 2016-10-11 MED ORDER — SODIUM CHLORIDE 0.9% FLUSH: 3.0000 mL | Freq: Two times a day (BID) | INTRAVENOUS | Status: DC

## 2016-10-11 MED ORDER — VERAPAMIL HCL 2.5 MG/ML IV SOLN
INTRAVENOUS | Status: AC
  Filled 2016-10-11: qty 2

## 2016-10-11 MED ORDER — SODIUM CHLORIDE 0.9 % IV SOLN: 250.0000 mL | INTRAVENOUS | Status: DC | PRN

## 2016-10-11 MED ORDER — LIDOCAINE HCL (PF) 1 % IJ SOLN
INTRAMUSCULAR | Status: AC
  Filled 2016-10-11: qty 30

## 2016-10-11 MED ORDER — VERAPAMIL HCL 2.5 MG/ML IV SOLN
INTRAVENOUS | Status: DC | PRN
  Administered 2016-10-11: 10 mL via INTRA_ARTERIAL

## 2016-10-11 MED ORDER — FENTANYL CITRATE (PF) 100 MCG/2ML IJ SOLN
INTRAMUSCULAR | Status: AC
  Filled 2016-10-11: qty 2

## 2016-10-11 MED ORDER — HEPARIN SODIUM (PORCINE) 1000 UNIT/ML IJ SOLN
INTRAMUSCULAR | Status: AC
  Filled 2016-10-11: qty 1

## 2016-10-11 MED ORDER — SODIUM CHLORIDE 0.9% FLUSH: 3.0000 mL | INTRAVENOUS | Status: DC | PRN

## 2016-10-11 MED ORDER — HEPARIN SODIUM (PORCINE) 1000 UNIT/ML IJ SOLN
INTRAMUSCULAR | Status: DC | PRN
  Administered 2016-10-11: 3500 [IU] via INTRAVENOUS

## 2016-10-11 MED ORDER — ASPIRIN 81 MG PO CHEW
81.0000 mg | CHEWABLE_TABLET | ORAL | Status: AC
  Administered 2016-10-11: 81 mg via ORAL

## 2016-10-11 MED ORDER — LIDOCAINE HCL (PF) 1 % IJ SOLN
INTRAMUSCULAR | Status: DC | PRN
  Administered 2016-10-11 (×2): 2 mL via INTRADERMAL

## 2016-10-11 MED ORDER — SODIUM CHLORIDE 0.9 % IV SOLN: INTRAVENOUS | Status: AC

## 2016-10-11 MED ORDER — MIDAZOLAM HCL 2 MG/2ML IJ SOLN
INTRAMUSCULAR | Status: AC
  Filled 2016-10-11: qty 2

## 2016-10-11 MED ORDER — FENTANYL CITRATE (PF) 100 MCG/2ML IJ SOLN
INTRAMUSCULAR | Status: DC | PRN
  Administered 2016-10-11: 50 ug via INTRAVENOUS

## 2016-10-11 MED ORDER — SODIUM CHLORIDE 0.9 % IV SOLN
INTRAVENOUS | Status: AC
  Administered 2016-10-11: 11:00:00 via INTRAVENOUS

## 2016-10-11 MED ORDER — IOPAMIDOL (ISOVUE-370) INJECTION 76%
INTRAVENOUS | Status: DC | PRN
  Administered 2016-10-11: 40 mL via INTRAVENOUS

## 2016-10-11 SURGICAL SUPPLY — 15 items
CATH BALLN WEDGE 5F 110CM (CATHETERS) ×2 IMPLANT
CATH EXPO 5F FL3.5 (CATHETERS) ×2 IMPLANT
CATH EXPO 5FR ANG PIGTAIL 145 (CATHETERS) ×2 IMPLANT
CATH EXPO 5FR FR4 (CATHETERS) ×2 IMPLANT
DEVICE RAD COMP TR BAND LRG (VASCULAR PRODUCTS) ×2 IMPLANT
GLIDESHEATH SLEND SS 6F .021 (SHEATH) ×2 IMPLANT
GUIDEWIRE .025 260CM (WIRE) ×2 IMPLANT
GUIDEWIRE INQWIRE 1.5J.035X260 (WIRE) IMPLANT
INQWIRE 1.5J .035X260CM (WIRE) ×3
KIT HEART LEFT (KITS) ×3 IMPLANT
PACK CARDIAC CATHETERIZATION (CUSTOM PROCEDURE TRAY) ×3 IMPLANT
SHEATH FAST CATH BRACH 5F 5CM (SHEATH) ×2 IMPLANT
SYR MEDRAD MARK V 150ML (SYRINGE) ×1 IMPLANT
TRANSDUCER W/STOPCOCK (MISCELLANEOUS) ×4 IMPLANT
TUBING CIL FLEX 10 FLL-RA (TUBING) ×3 IMPLANT

## 2016-10-11 NOTE — H&P (View-Only) (Signed)
Chief Complaint  Patient presents with  . New Patient (Initial Visit)     History of Present Illness: 71 yo female with history of HTN, ascending thoracic aortic aneurysm who is here today to discuss cardiac cath which is required as part of her pre-operative workup before planned replacement of her aorta. She has been seen by Dr. Cyndia Bent in the [facility] surgery office. Her ascending aorta measures up to 6.2 cm. Echo with normal LV systolic function, trileaflet aortic valve with moderate aortic insufficiency.  She tells me today that she has no chest pain or dyspnea. No LE edema. She is active and still working.     Primary Care Physician: Haywood Pao, MD   Past Medical History:  Diagnosis Date  . Blood transfusion without reported diagnosis 2009   anemia  . Hypertension   . Thoracic aortic aneurysm without rupture [facility])     Past Surgical History:  Procedure Laterality Date  . TUBAL LIGATION  1976    Current Outpatient Prescriptions  Medication Sig Dispense Refill  . hydrochlorothiazide (HYDRODIURIL) 25 MG tablet Take 25 mg by mouth daily.    Marland Kitchen ibuprofen (ADVIL,MOTRIN) 800 MG tablet Take 800 mg by mouth every 8 (eight) hours as needed for pain.     No current facility-administered medications for this visit.     Allergies  Allergen Reactions  . Codeine     hallucinations    Social History   Social History  . Marital status: Married    Spouse name: N/A  . Number of children: 1  . Years of education: N/A   Occupational History  . Semi Retired-housework    Social History Main Topics  . Smoking status: Former Smoker    Packs/day: 0.50    Years: 40.00    Types: Cigarettes    Start date: 10/06/1968    Quit date: 06/22/2008  . Smokeless tobacco: Never Used  . Alcohol use No  . Drug use: No  . Sexual activity: Not on file   Other Topics Concern  . Not on file   Social History Narrative  . No narrative on file    Family History  Problem Relation Age of  Onset  . Colon cancer Neg Hx     Review of Systems:  As stated in the HPI and otherwise negative.   BP 130/70   Pulse 76   Ht 5\' 2"  (1.575 m)   Wt 154 lb 6.4 oz (70 kg)   BMI 28.24 kg/m   Physical Examination: General: Well developed, well nourished, NAD  HEENT: OP clear, mucus membranes moist  SKIN: warm, dry. No rashes. Neuro: No focal deficits  Musculoskeletal: Muscle strength 5/5 all ext  Psychiatric: Mood and affect normal  Neck: No JVD, no carotid bruits, no thyromegaly, no lymphadenopathy.  Lungs:Clear bilaterally, no wheezes, rhonci, crackles Cardiovascular: Regular rate and rhythm. Soft diastolic murmur. No gallops or rubs. Abdomen:Soft. Bowel sounds present. Non-tender.  Extremities: No lower extremity edema. Pulses are 2 + in the bilateral DP/PT.  CTA chest 09/28/16: 1. Interval aneurysmal dilatation of the entirety of the thoracic aorta with the ascending thoracic aorta measuring 62 mm in diameter, previously, 38 mm. No evidence of thoracic aortic dissection or periaortic stranding. 2. The thoracic aortic aneurysm extends to involve the suprarenal abdominal aorta, measuring approximately 3.3 cm between the takeoff of the SMA and celiac arteries. The abdominal aorta tapers to a normal caliber below the level of the renal arteries. 3. Coronary artery calcifications. Aortic  Atherosclerosis (ICD10-170.0)  Echo 09/21/16: Left ventricle: The cavity size was normal. Wall thickness was   increased in a pattern of mild LVH. Systolic function was normal.   The estimated ejection fraction was in the range of 60% to 65%.   Wall motion was normal; there were no regional wall motion   abnormalities. Doppler parameters are consistent with abnormal   left ventricular relaxation (grade 1 diastolic dysfunction). The   E/e&' ratio is between 8-15, suggesting indeterminate LV filling   pressure. - Aortic valve: Trileaflet. There was moderate regurgitation.   Regurgitation  pressure half-time: 342 ms. - Aorta: Severely dilated ascending aorta up to 6.2 cm. No evidence   for dissection. The proximal descending aorta measures 4.4 cm.   The mid-descending aorta measures 3.1 cm. - Mitral valve: Mildly thickened leaflets . There was mild   regurgitation. - Left atrium: The atrium was normal in size. - Tricuspid valve: There was trivial regurgitation. - Pulmonic valve: There was mild regurgitation. - Pulmonary arteries: PA peak pressure: 22 mm Hg (S). - Inferior vena cava: The IVC measures <2.1 cm, but does not   collapse >50%, suggesting an elevated RA pressure of 8 mmHg.  Impressions:  - LVEF 60-65%, mild LVH, normal wall motion, diastolic dyfunction,   indeterminate LV filling pressure, severely dilated ascending   aorta up to 6.2 cm, the proximal descending aorta measures 4.4 cm   and the mid-descending aorta measures 3.1 cm, normal LA size,   trivial TR, mild PI, normal RVSP, elevated RA pressure of 8   mmHg,. no pericardial effusion. The patient reports she has been   seen by Dr. Cyndia Bent with CT surgery and has a follow-up next week   - findings are similar to that seen by CT of the neck.  EKG:  EKG is ordered today. The ekg ordered today demonstrates NSR, rate 77 bpm. RBBB. LAFB.   Recent Labs: No results found for requested labs within last 8760 hours.   Lipid Panel No results found for: CHOL, TRIG, HDL, CHOLHDL, VLDL, LDLCALC, LDLDIRECT   Wt Readings from Last 3 Encounters:  10/06/16 154 lb 6.4 oz (70 kg)  09/28/16 150 lb (68 kg)  09/12/16 150 lb (68 kg)     Other studies Reviewed: Additional studies/ records that were reviewed today include: . Review of the above records demonstrates:    Assessment and Plan:   1. Thoracic aortic aneurysm: Planning underway per Dr. Cyndia Bent for replacement of the ascending aorta/arch and aortic valve. Will plan right and left heart cath at [facility] on 10/11/16. Risks and benefits of procedure reviewed with pt  and husband. Will get pre-cath labs today.   Current medicines are reviewed at length with the patient today.  The patient does not have concerns regarding medicines.  The following changes have been made:  no change  Labs/ tests ordered today include:   Orders Placed This Encounter  Procedures  . Basic Metabolic Panel (BMET)  . CBC w/Diff  . INR/PT  . EKG 12-Lead    Disposition:   FU with Dr. Cyndia Bent after the cardiac cath.   Signed, Lauree Chandler, MD 10/06/2016 10:15 AM    [facility] [place], [place], [place]  [address] Phone: [phone]; Fax: [phone]

## 2016-10-11 NOTE — Discharge Instructions (Signed)

## 2016-10-11 NOTE — Interval H&P Note (Signed)
History and Physical Interval Note:  10/11/2016 3:32 PM  Nancy Montoya  has presented today for cardiac cath with the diagnosis of thoracic aortic aneurysm. The various methods of treatment have been discussed with the patient and family. After consideration of risks, benefits and other options for treatment, the patient has consented to  Procedure(s): Right/Left Heart Cath and Coronary Angiography (N/A) as a surgical intervention .  The patient's history has been reviewed, patient examined, no change in status, stable for surgery.  I have reviewed the patient's chart and labs.  Questions were answered to the patient's satisfaction.    Cath Lab Visit (complete for each Cath Lab visit)  Clinical Evaluation Leading to the Procedure:   ACS: No.  Non-ACS:    Anginal Classification: No Symptoms  Anti-ischemic medical therapy: No Therapy  Non-Invasive Test Results: No non-invasive testing performed  Prior CABG: No previous CABG         Lauree Chandler

## 2016-10-12 ENCOUNTER — Other Ambulatory Visit: Payer: Self-pay | Admitting: *Deleted

## 2016-10-12 ENCOUNTER — Ambulatory Visit (INDEPENDENT_AMBULATORY_CARE_PROVIDER_SITE_OTHER): Payer: PPO | Admitting: Surgery

## 2016-10-12 ENCOUNTER — Encounter (HOSPITAL_COMMUNITY): Payer: Self-pay | Admitting: Cardiovascular Disease

## 2016-10-12 VITALS — BP 120/64 | HR 76 | Resp 20 | Ht 63.0 in | Wt 154.0 lb

## 2016-10-12 DIAGNOSIS — I7121 Aneurysm of the ascending aorta, without rupture: Secondary | ICD-10-CM

## 2016-10-12 DIAGNOSIS — I712 Thoracic aortic aneurysm, without rupture, unspecified: Secondary | ICD-10-CM

## 2016-10-12 DIAGNOSIS — I7122 Aneurysm of the aortic arch, without rupture: Secondary | ICD-10-CM

## 2016-10-12 NOTE — Anesthesia Preprocedure Evaluation (Addendum)
Anesthesia Evaluation  Patient identified by MRN, date of birth, ID band Patient awake    Airway Mallampati: II  TM Distance: >3 FB Neck ROM: Full    Dental  (+) Edentulous Upper   Pulmonary former smoker,    breath sounds clear to auscultation       Cardiovascular hypertension, + Peripheral Vascular Disease  + Valvular Problems/Murmurs  Rhythm:Regular Rate:Normal + Systolic murmurs Asc Aortic aneursym   Neuro/Psych    GI/Hepatic Neg liver ROS,   Endo/Other  negative endocrine ROS  Renal/GU negative Renal ROS     Musculoskeletal negative musculoskeletal ROS (+)   Abdominal   Peds  Hematology   Anesthesia Other Findings   Reproductive/Obstetrics negative OB ROS                          Anesthesia Physical Anesthesia Plan  ASA: IV  Anesthesia Plan: General   Post-op Pain Management:    Induction: Intravenous  Airway Management Planned: Oral ETT  Additional Equipment: Arterial line, PA Cath and Ultrasound Guidance Line Placement  Intra-op Plan: Utilization of Controlled Hypotension per surrgeon request, Utilization Of Total Body Hypothermia per surgeon request and Delibrate Circulatory arrest per surgeon request  Post-operative Plan: Extubation in OR  Informed Consent: I have reviewed the patients History and Physical, chart, labs and discussed the procedure including the risks, benefits and alternatives for the proposed anesthesia with the patient or authorized representative who has indicated his/her understanding and acceptance.     Plan Discussed with:   Anesthesia Plan Comments: (Per [facility] at [facility]: Dr. Cyndia Bent wants right axillary cannulation and bilateral radial arterial lines. Myra Gianotti, PA-C 10/12/2016 2:14 PM)       Anesthesia Quick Evaluation

## 2016-10-14 ENCOUNTER — Encounter: Payer: Self-pay | Admitting: Surgery

## 2016-10-14 NOTE — Progress Notes (Signed)
HPI:  The patient returns today to review the results of her recent cardiac cath done in preparation for repair of a large ascending and arch aneurysm. She is doing well otherwise and asymptomatic. The cath shows no coronary disease and normal anatomy. Her CTA shows aneurysmal dilation of the entire thoracic aorta with the ascending aorta measuring 6.2 cm. It was 3.8 cm on the previous CT in 2008. The aortic arch measures 4.3 cm and was 3.5 cm previously. The descending aorta is 4.2 cm and was 3.0 cm previously. There is mild supra-renal aneurysmal dilation to 3.3 cm. Her echo shows a trileaflet aortic valve with moderate AI and normal LV function.  Current Outpatient Prescriptions  Medication Sig Dispense Refill  . hydrochlorothiazide (HYDRODIURIL) 25 MG tablet Take 25 mg by mouth daily.    Marland Kitchen ibuprofen (ADVIL,MOTRIN) 800 MG tablet Take 800 mg by mouth every 8 (eight) hours as needed for pain.     No current facility-administered medications for this visit.      Physical Exam: BP 120/64   Pulse 76   Resp 20   Ht 5\' 3"  (1.6 m)   Wt 154 lb (69.9 kg)   SpO2 95% Comment: RA  BMI 27.28 kg/m  She looks well Cardiac exam shows a regular rate and rhythm with a 2/6 diastolic murmur LSB Lungs are clear  Diagnostic Tests:  Nancy Montoya  Cardiac catheterization  Order# [id number]  Reading physician: Burnell Blanks, MD Ordering physician: Burnell Blanks, MD Study date: 10/11/16  Physicians   Panel Physicians Referring Physician Case Authorizing Physician  Burnell Blanks, MD (Primary)    Procedures   Right/Left Heart Cath and Coronary Angiography  Conclusion   1. No angiographic evidence of CAD 2. Normal filling pressures  Recommendations: Continue with planning for thoracic aortic replacement/valve replacement.   Indications   Thoracic aortic aneurysm without rupture (HCC) [I71.2 (ICD-10-CM)]  Procedural Details/Technique   Technical Details  Indication: Thoracic aortic aneurysm  Procedure: The risks, benefits, complications, treatment options, and expected outcomes were discussed with the patient. The patient and/or family concurred with the proposed plan, giving informed consent. The patient was brought to the cath lab after IV hydration was begun and oral premedication was given. The patient was further sedated with Versed and Fentanyl. There was an IV catheter present in the right antecubital area. This was prepped and draped. The IV catheter was changed for a 5 French sheath. A right heart catheterization was performed with a balloon tipped catheter. The right wrist was prepped and draped in a sterile fashion. 1% lidocaine was used for local anesthesia. Using the modified Seldinger access technique, a 5 French sheath was placed in the right radial artery. 3 mg Verapamil was given through the sheath. 3500 units IV heparin was given. Standard diagnostic catheters were used to perform selective coronary angiography. A pigtail catheter was used to cross the aortic valve. No LV gram performed. The sheath was removed from the right radial artery and a Terumo hemostasis band was applied at the arteriotomy site on the right wrist.     Estimated blood loss <50 mL.  During this procedure the patient was administered the following to achieve and maintain moderate conscious sedation: Versed 2 mg, Fentanyl 50 mcg, while the patient's heart rate, blood pressure, and oxygen saturation were continuously monitored. The period of conscious sedation was 33 minutes, of which I was present face-to-face 100% of this time.    Complications  Complications documented before study signed (10/11/2016 4:32 PM EST)    RIGHT/LEFT HEART CATH AND CORONARY ANGIOGRAPHY   None Documented by Burnell Blanks, MD 10/11/2016 4:23 PM EST  Time Range: Intra-procedure      Coronary Findings   Dominance: Right  Left Anterior Descending  Vessel is large.  First  Diagonal Branch  Vessel is moderate in size.  Second Diagonal Branch  Vessel is moderate in size.  Third Diagonal Branch  Vessel is small in size.  Left Circumflex  Vessel is large.  First Obtuse Marginal Branch  Vessel is small in size.  Second Obtuse Marginal Branch  Vessel is small in size.  Third Obtuse Marginal Branch  Vessel is large in size.  Right Coronary Artery  Vessel is large. Vessel is angiographically normal.  Right Posterior Descending Artery  Vessel is large in size.  Right Heart   Right Heart Pressures LV EDP is normal.    Coronary Diagrams   Diagnostic Diagram     Implants     No implant documentation for this case.  PACS Images   Show images for Cardiac catheterization   Link to Procedure Log   Procedure Log    Hemo Data   Flowsheet Row Most Recent Value  Fick Cardiac Output 5.7 L/min  Fick Cardiac Output Index 3.29 (L/min)/BSA  RA A Wave 9 mmHg  RA V Wave 6 mmHg  RA Mean 5 mmHg  RV Systolic Pressure 27 mmHg  RV Diastolic Pressure 3 mmHg  RV EDP 8 mmHg  PA Systolic Pressure 28 mmHg  PA Diastolic Pressure 10 mmHg  PA Mean 17 mmHg  PW A Wave 15 mmHg  PW V Wave 10 mmHg  PW Mean 9 mmHg  AO Systolic Pressure Q000111Q mmHg  AO Diastolic Pressure 60 mmHg  AO Mean 91 mmHg  LV Systolic Pressure Q000111Q mmHg  LV Diastolic Pressure 6 mmHg  LV EDP 14 mmHg  Arterial Occlusion Pressure Extended Systolic Pressure 0000000 mmHg  Arterial Occlusion Pressure Extended Diastolic Pressure 63 mmHg  Arterial Occlusion Pressure Extended Mean Pressure 97 mmHg  Left Ventricular Apex Extended Systolic Pressure [id number] mmHg  Left Ventricular Apex Extended Diastolic Pressure 4 mmHg  Left Ventricular Apex Extended EDP Pressure 12 mmHg  QP/QS 1  TPVR Index 5.16 HRUI  TSVR Index 27.61 HRUI  PVR SVR Ratio 0.09  TPVR/TSVR Ratio 0.19     Impression:  She will require a Bentall procedure using a composite bioprosthetic valve/graft with replacement of the ascending aorta and  arch using an elephant trunk graft to allow stent grafting of the descending aorta aneurysm in the future if it enlarges further. She will require grafts to the arch vessels. She will require right axillary artery cannulation and bilateral radial arterial lines for hemodynamic monitoring. I reviewed the CT scan images and echo findings with her and her husband and answered their questions. I discussed the operative procedure with the patient and her husband including alternatives, benefits and risks; including but not limited to bleeding, blood transfusion, infection, stroke, myocardial infarction, graft failure, heart block requiring a permanent pacemaker, organ dysfunction, and death.  Francine Graven understands and agrees to proceed.    Plan:  Bentall procedure using a composite bioprosthetic valve/graft, replacement of the ascending aorta and arch using an elephant trunk graft with bypass of the arch vessels, right axillary artery cannulation on 10/21/2015.  Gaye Pollack, MD [facility] [phone]

## 2016-10-18 ENCOUNTER — Ambulatory Visit (HOSPITAL_COMMUNITY)
Admission: RE | Admit: 2016-10-18 | Discharge: 2016-10-18 | Disposition: A | Discharge status: Home / Self Care | Payer: PPO | Source: Ambulatory Visit | Attending: Surgery | Admitting: Surgery

## 2016-10-18 ENCOUNTER — Encounter (HOSPITAL_COMMUNITY)
Admission: RE | Admit: 2016-10-18 | Discharge: 2016-10-18 | Disposition: A | Discharge status: Home / Self Care | Payer: PPO | Source: Ambulatory Visit | Attending: Surgery | Admitting: Surgery

## 2016-10-18 ENCOUNTER — Encounter (HOSPITAL_COMMUNITY): Payer: Self-pay

## 2016-10-18 ENCOUNTER — Ambulatory Visit (HOSPITAL_BASED_OUTPATIENT_CLINIC_OR_DEPARTMENT_OTHER)
Admission: RE | Admit: 2016-10-18 | Discharge: 2016-10-18 | Disposition: A | Discharge status: Home / Self Care | Payer: PPO | Source: Ambulatory Visit | Attending: Surgery | Admitting: Surgery

## 2016-10-18 DIAGNOSIS — I739 Peripheral vascular disease, unspecified: Secondary | ICD-10-CM | POA: Diagnosis not present

## 2016-10-18 DIAGNOSIS — E8770 Fluid overload, unspecified: Secondary | ICD-10-CM | POA: Diagnosis not present

## 2016-10-18 DIAGNOSIS — R942 Abnormal results of pulmonary function studies: Secondary | ICD-10-CM

## 2016-10-18 DIAGNOSIS — I712 Thoracic aortic aneurysm, without rupture: Secondary | ICD-10-CM

## 2016-10-18 DIAGNOSIS — I7122 Aneurysm of the aortic arch, without rupture: Secondary | ICD-10-CM

## 2016-10-18 DIAGNOSIS — Z01818 Encounter for other preprocedural examination: Secondary | ICD-10-CM

## 2016-10-18 DIAGNOSIS — I6523 Occlusion and stenosis of bilateral carotid arteries: Secondary | ICD-10-CM | POA: Insufficient documentation

## 2016-10-18 DIAGNOSIS — I10 Essential (primary) hypertension: Secondary | ICD-10-CM | POA: Diagnosis not present

## 2016-10-18 DIAGNOSIS — D62 Acute posthemorrhagic anemia: Secondary | ICD-10-CM | POA: Diagnosis not present

## 2016-10-18 DIAGNOSIS — I7121 Aneurysm of the ascending aorta, without rupture: Secondary | ICD-10-CM

## 2016-10-18 DIAGNOSIS — R000 Tachycardia, unspecified: Secondary | ICD-10-CM | POA: Diagnosis not present

## 2016-10-18 DIAGNOSIS — Z87891 Personal history of nicotine dependence: Secondary | ICD-10-CM | POA: Insufficient documentation

## 2016-10-18 DIAGNOSIS — E876 Hypokalemia: Secondary | ICD-10-CM | POA: Diagnosis not present

## 2016-10-18 DIAGNOSIS — D170 Benign lipomatous neoplasm of skin and subcutaneous tissue of head, face and neck: Secondary | ICD-10-CM | POA: Diagnosis not present

## 2016-10-18 LAB — PULMONARY FUNCTION TEST
DL/VA % pred: 92 %
DL/VA: 4.39 ml/min/mmHg/L
DLCO UNC % PRED: 54 %
DLCO UNC: 12.78 ml/min/mmHg
DLCO cor % pred: 55 %
DLCO cor: 12.98 ml/min/mmHg
FEF 25-75 PRE: 2.61 L/s
FEF 25-75 Post: 1.76 L/sec
FEF2575-%Change-Post: -32 %
FEF2575-%PRED-PRE: 165 %
FEF2575-%Pred-Post: 111 %
FEV1-%CHANGE-POST: -1 %
FEV1-%PRED-POST: 94 %
FEV1-%PRED-PRE: 96 %
FEV1-PRE: 1.67 L
FEV1-Post: 1.65 L
FEV1FVC-%Change-Post: -11 %
FEV1FVC-%PRED-PRE: 114 %
FEV6-%CHANGE-POST: 10 %
FEV6-%PRED-POST: 97 %
FEV6-%Pred-Pre: 88 %
FEV6-POST: 2.09 L
FEV6-PRE: 1.89 L
FEV6FVC-%PRED-POST: 104 %
FEV6FVC-%PRED-PRE: 104 %
FVC-%Change-Post: 11 %
FVC-%Pred-Post: 93 %
FVC-%Pred-Pre: 84 %
FVC-Post: 2.1 L
FVC-Pre: 1.89 L
PRE FEV6/FVC RATIO: 100 %
Post FEV1/FVC ratio: 78 %
Post FEV6/FVC ratio: 100 %
Pre FEV1/FVC ratio: 88 %
RV % PRED: 73 %
RV: 1.61 L
TLC % pred: 71 %
TLC: 3.57 L

## 2016-10-18 LAB — URINALYSIS, ROUTINE W REFLEX MICROSCOPIC
BILIRUBIN URINE: NEGATIVE
Glucose, UA: NEGATIVE mg/dL
Hgb urine dipstick: NEGATIVE
Ketones, ur: 5 mg/dL — AB
Nitrite: NEGATIVE
Protein, ur: NEGATIVE mg/dL
SPECIFIC GRAVITY, URINE: 1.017 (ref 1.005–1.030)
pH: 6 (ref 5.0–8.0)

## 2016-10-18 LAB — CBC
HCT: 41.9 % (ref 36.0–46.0)
Hemoglobin: 13.5 g/dL (ref 12.0–15.0)
MCH: 27.3 pg (ref 26.0–34.0)
MCHC: 32.2 g/dL (ref 30.0–36.0)
MCV: 84.8 fL (ref 78.0–100.0)
Platelets: 236 K/uL (ref 150–400)
RBC: 4.94 MIL/uL (ref 3.87–5.11)
RDW: 14.3 % (ref 11.5–15.5)
WBC: 7.2 K/uL (ref 4.0–10.5)

## 2016-10-18 LAB — COMPREHENSIVE METABOLIC PANEL WITH GFR
ALT: 19 U/L (ref 14–54)
AST: 24 U/L (ref 15–41)
Albumin: 4.4 g/dL (ref 3.5–5.0)
Alkaline Phosphatase: 67 U/L (ref 38–126)
Anion gap: 11 (ref 5–15)
BUN: 13 mg/dL (ref 6–20)
CO2: 24 mmol/L (ref 22–32)
Calcium: 10.1 mg/dL (ref 8.9–10.3)
Chloride: 103 mmol/L (ref 101–111)
Creatinine, Ser: 1.08 mg/dL — ABNORMAL HIGH (ref 0.44–1.00)
GFR calc Af Amer: 58 mL/min — ABNORMAL LOW
GFR calc non Af Amer: 50 mL/min — ABNORMAL LOW
Glucose, Bld: 93 mg/dL (ref 65–99)
Potassium: 3.2 mmol/L — ABNORMAL LOW (ref 3.5–5.1)
Sodium: 138 mmol/L (ref 135–145)
Total Bilirubin: 0.8 mg/dL (ref 0.3–1.2)
Total Protein: 7.7 g/dL (ref 6.5–8.1)

## 2016-10-18 LAB — PROTIME-INR
INR: 1
Prothrombin Time: 13.2 s (ref 11.4–15.2)

## 2016-10-18 LAB — BLOOD GAS, ARTERIAL
Acid-Base Excess: 0.6 mmol/L (ref 0.0–2.0)
BICARBONATE: 24.3 mmol/L (ref 20.0–28.0)
Drawn by: 205171
FIO2: 0.21
O2 Saturation: 96.3 %
PH ART: 7.446 (ref 7.350–7.450)
Patient temperature: 98.6
pCO2 arterial: 35.8 mmHg (ref 32.0–48.0)
pO2, Arterial: 83.3 mmHg (ref 83.0–108.0)

## 2016-10-18 LAB — ABO/RH: ABO/RH(D): O POS

## 2016-10-18 LAB — APTT: aPTT: 37 s — ABNORMAL HIGH (ref 24–36)

## 2016-10-18 LAB — SURGICAL PCR SCREEN
MRSA, PCR: NEGATIVE
Staphylococcus aureus: NEGATIVE

## 2016-10-18 MED ORDER — ALBUTEROL SULFATE (2.5 MG/3ML) 0.083% IN NEBU
2.5000 mg | INHALATION_SOLUTION | Freq: Once | RESPIRATORY_TRACT | Status: AC
  Administered 2016-10-18: 2.5 mg via RESPIRATORY_TRACT

## 2016-10-18 NOTE — Pre-Procedure Instructions (Signed)
Nancy Montoya  10/18/2016      [id number] - [place], [place] [id number] [place] [id number] Phone: [phone] Fax: [phone]    Your procedure is scheduled on January 18  Report to Nancy Montoya at [place].M.  Call this number if you have problems the morning of surgery:  [phone]   Remember:  Do not eat food or drink liquids after midnight.   Take these medicines the morning of surgery with A SIP OF WATER NONE  7 days prior to surgery STOP taking any Aspirin, Aleve, Naproxen, Ibuprofen, Motrin, Advil, Goody's, BC's, all herbal medications, fish oil, and all vitamins    Do not wear jewelry, make-up or nail polish.  Do not wear lotions, powders, or perfumes, or deoderant.  Do not shave 48 hours prior to surgery.    Do not bring valuables to the hospital.  [facility] is not responsible for any belongings or valuables.  Contacts, dentures or bridgework may not be worn into surgery.  Leave your suitcase in the car.  After surgery it may be brought to your room.  For patients admitted to the hospital, discharge time will be determined by your treatment team.  Patients discharged the day of surgery will not be allowed to drive home.    Special instructions:   [place]- Preparing For Surgery  Before surgery, you can play an important role. Because skin is not sterile, your skin needs to be as free of germs as possible. You can reduce the number of germs on your skin by washing with CHG (chlorahexidine gluconate) Soap before surgery.  CHG is an antiseptic cleaner which kills germs and bonds with the skin to continue killing germs even after washing.  Please do not use if you have an allergy to CHG or antibacterial soaps. If your skin becomes reddened/irritated stop using the CHG.  Do not shave (including legs and underarms) for at least 48 hours prior to first CHG  shower. It is OK to shave your face.  Please follow these instructions carefully.   1. Shower the NIGHT BEFORE SURGERY and the MORNING OF SURGERY with CHG.   2. If you chose to wash your hair, wash your hair first as usual with your normal shampoo.  3. After you shampoo, rinse your hair and body thoroughly to remove the shampoo.  4. Use CHG as you would any other liquid soap. You can apply CHG directly to the skin and wash gently with a scrungie or a clean washcloth.   5. Apply the CHG Soap to your body ONLY FROM THE NECK DOWN.  Do not use on open wounds or open sores. Avoid contact with your eyes, ears, mouth and genitals (private parts). Wash genitals (private parts) with your normal soap.  6. Wash thoroughly, paying special attention to the area where your surgery will be performed.  7. Thoroughly rinse your body with warm water from the neck down.  8. DO NOT shower/wash with your normal soap after using and rinsing off the CHG Soap.  9. Pat yourself dry with a CLEAN TOWEL.   10. Wear CLEAN PAJAMAS   11. Place CLEAN SHEETS on your bed the night of your first shower and DO NOT SLEEP WITH PETS.    Day of Surgery: Do not apply any deodorants/lotions. Please wear clean clothes to the hospital/surgery center.      Please read  over the following fact sheets that you were given.

## 2016-10-18 NOTE — Progress Notes (Signed)
PCP - richard Tisovec Cardiologist - [place]  Chest x-ray - 10/18/16 EKG - 10/11/16 Stress Test -denies  ECHO - 09/21/16 Cardiac Cath - 10/11/16 Sleep Study - denies   Sending to anesthesia for review of EKG  Patient denies shortness of breath, fever, cough and chest pain at PAT appointment   Patient verbalized understanding of instructions that was given to them at the PAT appointment. Patient expressed that there were no further questions.  Patient was also instructed that they will need to review over the PAT instructions again at home before the surgery.

## 2016-10-18 NOTE — Progress Notes (Signed)
Pre-op Cardiac Surgery  Carotid Findings:  1-39% ICA plaquing. Vertebral artery flow is antegrade.   Upper Extremity Right Left  Brachial Pressures 140T 151T  Radial Waveforms T T  Ulnar Waveforms T T  Palmar Arch (Allen's Test) WNL Doppler signal remains normal with radial compression and obliterates with ulnar compression   Findings:      Lower  Extremity Right Left  Dorsalis Pedis    Anterior Tibial    Posterior Tibial    Ankle/Brachial Indices      Findings:

## 2016-10-19 LAB — VAS US DOPPLER PRE CABG
LCCADSYS: -58 cm/s
LCCAPSYS: 77 cm/s
LEFT ECA DIAS: -5 cm/s
LEFT VERTEBRAL DIAS: 16 cm/s
Left CCA dist dias: -10 cm/s
Left CCA prox dias: 8 cm/s
Left ICA dist dias: -20 cm/s
Left ICA dist sys: -67 cm/s
Left ICA prox dias: -7 cm/s
Left ICA prox sys: -26 cm/s
RCCADSYS: -81 cm/s
RCCAPDIAS: 2 cm/s
RCCAPSYS: 59 cm/s
RIGHT ECA DIAS: -4 cm/s
RIGHT VERTEBRAL DIAS: -2 cm/s

## 2016-10-19 LAB — HEMOGLOBIN A1C
HEMOGLOBIN A1C: 5.6 % (ref 4.8–5.6)
Mean Plasma Glucose: 114 mg/dL

## 2016-10-19 MED ORDER — DEXTROSE 5 % IV SOLN
1.5000 g | INTRAVENOUS | Status: AC
  Administered 2016-10-20: 750 g via INTRAVENOUS
  Administered 2016-10-20: 1.5 g via INTRAVENOUS
  Filled 2016-10-19: qty 1.5

## 2016-10-19 MED ORDER — NITROGLYCERIN IN D5W 200-5 MCG/ML-% IV SOLN
2.0000 ug/min | INTRAVENOUS | Status: DC
Start: 2016-10-20 — End: 2016-10-20
  Filled 2016-10-19: qty 250

## 2016-10-19 MED ORDER — MAGNESIUM SULFATE 50 % IJ SOLN
40.0000 meq | INTRAMUSCULAR | Status: DC
  Filled 2016-10-19: qty 10

## 2016-10-19 MED ORDER — SODIUM CHLORIDE 0.9 % IV SOLN
INTRAVENOUS | Status: AC
  Administered 2016-10-20: 1 [IU]/h via INTRAVENOUS
  Filled 2016-10-19: qty 2.5

## 2016-10-19 MED ORDER — TRANEXAMIC ACID (OHS) PUMP PRIME SOLUTION
2.0000 mg/kg | INTRAVENOUS | Status: DC
  Filled 2016-10-19: qty 1.38

## 2016-10-19 MED ORDER — PLASMA-LYTE 148 IV SOLN
INTRAVENOUS | Status: DC
  Filled 2016-10-19: qty 2.5

## 2016-10-19 MED ORDER — DEXTROSE 5 % IV SOLN
750.0000 mg | INTRAVENOUS | Status: DC
  Filled 2016-10-19: qty 750

## 2016-10-19 MED ORDER — TRANEXAMIC ACID (OHS) BOLUS VIA INFUSION
15.0000 mg/kg | INTRAVENOUS | Status: AC
  Administered 2016-10-20: 1036.5 mg via INTRAVENOUS
  Filled 2016-10-19: qty 1037

## 2016-10-19 MED ORDER — TRANEXAMIC ACID 1000 MG/10ML IV SOLN
1.5000 mg/kg/h | INTRAVENOUS | Status: AC
  Administered 2016-10-20: 1.5 mg/kg/h via INTRAVENOUS
  Filled 2016-10-19: qty 25

## 2016-10-19 MED ORDER — VANCOMYCIN HCL 10 G IV SOLR
1250.0000 mg | INTRAVENOUS | Status: AC
  Administered 2016-10-20: 1250 mg via INTRAVENOUS
  Filled 2016-10-19 (×2): qty 1250

## 2016-10-19 MED ORDER — EPINEPHRINE PF 1 MG/ML IJ SOLN
0.0000 ug/min | INTRAMUSCULAR | Status: DC
  Filled 2016-10-19: qty 4

## 2016-10-19 MED ORDER — DEXMEDETOMIDINE HCL IN NACL 400 MCG/100ML IV SOLN
0.1000 ug/kg/h | INTRAVENOUS | Status: AC
  Administered 2016-10-20: .2 ug/kg/h via INTRAVENOUS
  Filled 2016-10-19: qty 100

## 2016-10-19 MED ORDER — DOPAMINE-DEXTROSE 3.2-5 MG/ML-% IV SOLN
0.0000 ug/kg/min | INTRAVENOUS | Status: DC
  Filled 2016-10-19: qty 250

## 2016-10-19 MED ORDER — SODIUM CHLORIDE 0.9 % IV SOLN
INTRAVENOUS | Status: DC
  Filled 2016-10-19: qty 30

## 2016-10-19 MED ORDER — POTASSIUM CHLORIDE 2 MEQ/ML IV SOLN
80.0000 meq | INTRAVENOUS | Status: DC
  Filled 2016-10-19: qty 40

## 2016-10-19 MED ORDER — SODIUM CHLORIDE 0.9 % IV SOLN
30.0000 ug/min | INTRAVENOUS | Status: AC
  Administered 2016-10-20: 20 ug/min via INTRAVENOUS
  Filled 2016-10-19: qty 2

## 2016-10-19 NOTE — Progress Notes (Signed)
Anesthesia chart review: Patient is a 71 year old female scheduled for Bentall procedure,arch replacement on 10/20/2016 by Dr. Cyndia Bent. Per [facility] RN Thurmond Butts, "He [Dr. Cyndia Bent wants right axillary cannulation & bilateral radial arterial lines."  History includes ascending thoracic aortic aneurysm, former smoker (quit '09), anemia, HTN, right neck lipoma (by U/S 09/06/16).  PCP is Dr. Domenick Gong. Cardiologist is Dr. Lauree Chandler.  Meds include HCTZ.  BP (!) 138/55   Pulse 71   Temp 36.7 C (Oral)   Resp 18   Ht 5\' 3"  (1.6 m)   Wt 152 lb 7 oz (69.1 kg)   SpO2 99%   BMI 27.00 kg/m    EKG 10/11/16: NSR, right BBB, LAFB, bifascicular block.  Cardiac cath 10/11/16: 1. No angiographic evidence of CAD 2. Normal filling pressures Recommendations: Continue with planning for thoracic aortic replacement/valve replacement.   Echo 09/21/16: Impressions: - LVEF 60-65%, mild LVH, normal wall motion, diastolic dyfunction,   indeterminate LV filling pressure, severely dilated ascending   aorta up to 6.2 cm, the proximal descending aorta measures 4.4 cm   and the mid-descending aorta measures 3.1 cm, normal LA size,   trivial TR, mild PI, normal RVSP, elevated RA pressure of 8   mmHg,. no pericardial effusion. The patient reports she has been   seen by Dr. Cyndia Bent with CT surgery and has a follow-up next week   - findings are similar to that seen by CT of the neck.  Carotid U/S 10/18/16: Summary: Bilateral: intimal wall thickening CCA. 1-39% ICA plaquing. Vertebral artery flow is antegrade.    Neck U/S 09/06/16: IMPRESSION: 1. 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 2. At least 6.1 cm ascending aortic aneurysm, incompletely visualized. Recommend semi-annual imaging followup by CTA or MRA and referral to cardiothoracic surgery if not already obtained.   CTA chest/abd/pelvis 09/28/16: IMPRESSION: Vascular Impression: 1. Interval aneurysmal dilatation of the entirety of the thoracic aorta with the  ascending thoracic aorta measuring 62 mm in diameter, previously, 38 mm. No evidence of thoracic aortic dissection or periaortic stranding. 2. The thoracic aortic aneurysm extends to involve the suprarenal abdominal aorta, measuring approximately 3.3 cm between the takeoff of the SMA and celiac arteries. The abdominal aorta tapers to a normal caliber below the level of the renal arteries. 3. Coronary artery calcifications. Aortic Atherosclerosis Nonvascular Impression: 1. No acute findings within the chest, abdomen or pelvis. 2. Multiple bilateral renal cysts. 3. Colonic diverticulosis without evidence of diverticulitis. 4. Additional ancillary findings as above.  Preoperative chest x-ray and labs noted.  If no acute changes then I anticipate she can proceed as planned.  George Hugh [facility] Stay [facility]/Anesthesiology Phone [phone] 10/19/2016 10:18 AM

## 2016-10-19 NOTE — H&P (Signed)
[address]       [place] [address]             [phone]      Cardiothoracic Surgery Admission History and Physical   PCP is Haywood Pao, MD Referring Provider is Tisovec, Fransico Him, MD      Chief Complaint  Patient presents with  . Thoracic Aortic Aneurysm        HPI:  The patient is a 71 year old woman with hypertension and a right neck lipoma who was recently evaluated by her PCP and it was felt that the neck mass may be larger. She says she doesn't really think about it. An ultrasound showed it measured 6.4 cm and a CT was recommended. A CT of the neck showed that it was a 7.2 cm right neck lipoma. It also showed a 6.1 cm distal ascending aortic aneurysm that extended out into the arch and proximal descending aorta where it was 4.5 cm. She denies any chest or back pain. She denies any neck pain. She has some mild shortness of breath with activity but none at rest. She denies peripheral edema. Her CTA shows aneurysmal dilation of the entire thoracic aorta with the ascending aorta measuring 6.2 cm. It was 3.8 cm on the previous CT in 2008. The aortic arch measures 4.3 cm and was 3.5 cm previously. The descending aorta is 4.2 cm and was 3.0 cm previously. There is mild supra-renal aneurysmal dilation to 3.3 cm. Her echo shows a trileaflet aortic valve with moderate AI and normal LV function. Cardiac cath shows no coronary disease and normal anatomy.      Past Medical History:  Diagnosis Date  . Blood transfusion without reported diagnosis 2009   anemia  . Hypertension          Past Surgical History:  Procedure Laterality Date  . TUBAL LIGATION  1976         Family History  Problem Relation Age of Onset  . Colon cancer Neg Hx   No family history of aortic aneurysm, dissection or bicuspid valve disease. No family history of connective tissue disease.  Social History      Social History  Substance Use Topics  . Smoking  status: Former Smoker    Quit date: 06/22/2008  . Smokeless tobacco: Never Used  . Alcohol use No          Current Outpatient Prescriptions  Medication Sig Dispense Refill  . hydrochlorothiazide (HYDRODIURIL) 25 MG tablet Take 25 mg by mouth daily.    Marland Kitchen ibuprofen (ADVIL,MOTRIN) 800 MG tablet Take 800 mg by mouth every 8 (eight) hours as needed for pain.     No current facility-administered medications for this visit.          Allergies  Allergen Reactions  . Codeine     hallucinations    Review of Systems  Constitutional: Negative.   HENT: Negative.        Dentures  Eyes:       Floaters  Respiratory: Positive for shortness of breath. Negative for chest tightness.   Cardiovascular: Negative for chest pain, palpitations and leg swelling.  Gastrointestinal: Negative.   Endocrine: Negative.   Genitourinary: Negative.   Musculoskeletal: Negative.  Negative for back pain.  Neurological: Negative.   Hematological: Bruises/bleeds easily.  Psychiatric/Behavioral: Negative.     BP (!) 159/79   Pulse 79   Resp 20   Ht 5\' 3"  (1.6 m)  Wt 150 lb (68 kg)   SpO2 96%   BMI 26.57 kg/m  Physical Exam  Constitutional: She is oriented to person, place, and time. She appears well-developed and well-nourished. No distress.  HENT:  Head: Normocephalic and atraumatic.  Mouth/Throat: Oropharynx is clear and moist.  Eyes: Conjunctivae and EOM are normal. Pupils are equal, round, and reactive to light.  Neck: Normal range of motion. Neck supple. No JVD present.  Soft palpable mass in right neck beneath SCM muscle.  Cardiovascular: Normal rate, regular rhythm and intact distal pulses.   Murmur heard. 2/6 diastolic murmur along LSB  Pulmonary/Chest: Effort normal. No respiratory distress. She has no wheezes. She has no rales.  Abdominal: Soft. Bowel sounds are normal. She exhibits no distension and no mass. There is no tenderness.  Musculoskeletal: Normal range of  motion. She exhibits no edema or tenderness.  Lymphadenopathy:    She has no cervical adenopathy.  Neurological: She is alert and oriented to person, place, and time. She has normal strength. No cranial nerve deficit or sensory deficit.  Skin: Skin is warm and dry.  Psychiatric: She has a normal mood and affect.    Diagnostic Tests:  CLINICAL DATA: Right anterior neck mass.  EXAM: CT NECK WITH CONTRAST  TECHNIQUE: Multidetector CT imaging of the neck was performed using the standard protocol following the bolus administration of intravenous contrast.  CONTRAST: 75 mL Isovue-300  COMPARISON: Neck ultrasound 08/19/2016  FINDINGS: Pharynx and larynx: No pharyngeal mass or inflammatory change. Unremarkable larynx.  Salivary glands: No inflammation, mass, or stone.  Thyroid: Unremarkable.  Lymph nodes: No enlarged lymph nodes are identified in the neck.  Vascular: Major vascular structures of the neck appear patent.  Limited intracranial: Unremarkable.  Visualized orbits: Unremarkable.  Mastoids and visualized paranasal sinuses: Clear.  Skeleton: Advanced right facet arthrosis at C4-5 with trace anterolisthesis. Mild cervical disc degeneration greatest at C5-6.  Upper chest: Partially visualized thoracic aortic aneurysm. The ascending aorta measures at least 6.1 cm in diameter. The transverse aorta measures up to 4.5 cm, and the proximal descending aorta measures 4.5 cm.  Other: Corresponding to the palpable abnormality in the right neck is a homogeneous fat density mass. This measures 5.0 x 2.9 x 7.2 cm and is located deep/medial to the right sternocleidomastoid muscle and anterior to the right thyroid lobe and carotid space vessels. There is mild mass effect on the adjacent structures. No enhancing mass component is seen. A vein courses through the mass.  IMPRESSION: 1. 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 2. At least 6.1 cm ascending aortic  aneurysm, incompletely visualized. Recommend semi-annual imaging followup by CTA or MRA and referral to cardiothoracic surgery if not already obtained. This recommendation follows 2010 ACCF/AHA/AATS/ACR/ASA/SCA/SCAI/SIR/STS/SVM Guidelines for the Diagnosis and Management of Patients With Thoracic Aortic Disease. Circulation. 2010; 121: [id number]   Electronically Signed By: Logan Bores M.D. On: 09/06/2016 14:39  Impression:  She will require a Bentall procedure using a composite bioprosthetic valve/graft with replacement of the ascending aorta and arch using an elephant trunk graft to allow stent grafting of the descending aorta aneurysm in the future if it enlarges further. She will require grafts to the arch vessels. She will require right axillary artery cannulation and bilateral radial arterial lines for hemodynamic monitoring. I reviewed the CT scan images and echo findings with her and her husband and answered their questions. I discussed the operative procedure with the patient and her husbandincluding alternatives, benefits and risks; including but not limited to bleeding, blood  transfusion, infection, stroke, myocardial infarction, graft failure, heart block requiring a permanent pacemaker, organ dysfunction, and death. Shailene F Marshunderstands and agrees to proceed.   Plan:  Bentall procedure using a composite bioprosthetic valve/graft, replacement of the ascending aorta and arch using an elephant trunk graft with bypass of the arch vessels, right axillary artery cannulation on 10/21/2015.   Gaye Pollack, MD [facility] [phone]

## 2016-10-20 ENCOUNTER — Inpatient Hospital Stay (HOSPITAL_COMMUNITY): Payer: PPO

## 2016-10-20 ENCOUNTER — Encounter (HOSPITAL_COMMUNITY): Payer: Self-pay | Admitting: *Deleted

## 2016-10-20 ENCOUNTER — Encounter (HOSPITAL_COMMUNITY)
Admission: RE | Disposition: A | Discharge status: Home / Self Care | Payer: Self-pay | Source: Ambulatory Visit | Attending: Surgery

## 2016-10-20 ENCOUNTER — Inpatient Hospital Stay (HOSPITAL_COMMUNITY)
Admission: RE | Admit: 2016-10-20 | Discharge: 2016-10-25 | DRG: 220 | Disposition: A | Discharge status: Home / Self Care | Payer: PPO | Source: Ambulatory Visit | Attending: Surgery | Admitting: Surgery

## 2016-10-20 ENCOUNTER — Inpatient Hospital Stay (HOSPITAL_COMMUNITY): Payer: PPO | Admitting: Vascular Surgery

## 2016-10-20 DIAGNOSIS — E8770 Fluid overload, unspecified: Secondary | ICD-10-CM | POA: Diagnosis not present

## 2016-10-20 DIAGNOSIS — I517 Cardiomegaly: Secondary | ICD-10-CM | POA: Diagnosis not present

## 2016-10-20 DIAGNOSIS — J90 Pleural effusion, not elsewhere classified: Secondary | ICD-10-CM | POA: Diagnosis not present

## 2016-10-20 DIAGNOSIS — I739 Peripheral vascular disease, unspecified: Secondary | ICD-10-CM | POA: Diagnosis present

## 2016-10-20 DIAGNOSIS — E876 Hypokalemia: Secondary | ICD-10-CM | POA: Diagnosis not present

## 2016-10-20 DIAGNOSIS — I7122 Aneurysm of the aortic arch, without rupture: Secondary | ICD-10-CM

## 2016-10-20 DIAGNOSIS — R5383 Other fatigue: Secondary | ICD-10-CM | POA: Diagnosis not present

## 2016-10-20 DIAGNOSIS — D170 Benign lipomatous neoplasm of skin and subcutaneous tissue of head, face and neck: Secondary | ICD-10-CM | POA: Diagnosis not present

## 2016-10-20 DIAGNOSIS — Z9889 Other specified postprocedural states: Secondary | ICD-10-CM

## 2016-10-20 DIAGNOSIS — J9811 Atelectasis: Secondary | ICD-10-CM | POA: Diagnosis not present

## 2016-10-20 DIAGNOSIS — I712 Thoracic aortic aneurysm, without rupture: Secondary | ICD-10-CM | POA: Diagnosis not present

## 2016-10-20 DIAGNOSIS — Z952 Presence of prosthetic heart valve: Secondary | ICD-10-CM

## 2016-10-20 DIAGNOSIS — M7989 Other specified soft tissue disorders: Secondary | ICD-10-CM | POA: Diagnosis not present

## 2016-10-20 DIAGNOSIS — Z87891 Personal history of nicotine dependence: Secondary | ICD-10-CM | POA: Diagnosis not present

## 2016-10-20 DIAGNOSIS — R000 Tachycardia, unspecified: Secondary | ICD-10-CM | POA: Diagnosis not present

## 2016-10-20 DIAGNOSIS — I7121 Aneurysm of the ascending aorta, without rupture: Secondary | ICD-10-CM

## 2016-10-20 DIAGNOSIS — I10 Essential (primary) hypertension: Secondary | ICD-10-CM | POA: Diagnosis present

## 2016-10-20 DIAGNOSIS — D62 Acute posthemorrhagic anemia: Secondary | ICD-10-CM | POA: Diagnosis not present

## 2016-10-20 DIAGNOSIS — I714 Abdominal aortic aneurysm, without rupture: Secondary | ICD-10-CM | POA: Diagnosis not present

## 2016-10-20 DIAGNOSIS — R918 Other nonspecific abnormal finding of lung field: Secondary | ICD-10-CM | POA: Diagnosis not present

## 2016-10-20 HISTORY — PX: BENTALL PROCEDURE: SHX5058

## 2016-10-20 HISTORY — PX: REPLACEMENT ASCENDING AORTA: SHX6068

## 2016-10-20 HISTORY — PX: TEE WITHOUT CARDIOVERSION: SHX5443

## 2016-10-20 LAB — POCT I-STAT 3, ART BLOOD GAS (G3+)
ACID-BASE EXCESS: 4 mmol/L — AB (ref 0.0–2.0)
Acid-Base Excess: 2 mmol/L (ref 0.0–2.0)
BICARBONATE: 26.9 mmol/L (ref 20.0–28.0)
BICARBONATE: 27.8 mmol/L (ref 20.0–28.0)
BICARBONATE: 26.1 mmol/L (ref 20.0–28.0)
O2 Saturation: 100 %
O2 Saturation: 100 %
O2 Saturation: 100 %
PH ART: 7.432 (ref 7.350–7.450)
PO2 ART: 457 mmHg — AB (ref 83.0–108.0)
TCO2: 28 mmol/L (ref 0–100)
TCO2: 29 mmol/L (ref 0–100)
TCO2: 28 mmol/L (ref 0–100)
pCO2 arterial: 40.4 mmHg (ref 32.0–48.0)
pCO2 arterial: 35.4 mmHg (ref 32.0–48.0)
pCO2 arterial: 51.2 mmHg — ABNORMAL HIGH (ref 32.0–48.0)
pH, Arterial: 7.502 — ABNORMAL HIGH (ref 7.350–7.450)
pH, Arterial: 7.316 — ABNORMAL LOW (ref 7.350–7.450)
pO2, Arterial: 466 mmHg — ABNORMAL HIGH (ref 83.0–108.0)
pO2, Arterial: 618 mmHg — ABNORMAL HIGH (ref 83.0–108.0)

## 2016-10-20 LAB — POCT I-STAT, CHEM 8
BUN: 19 mg/dL (ref 6–20)
BUN: 18 mg/dL (ref 6–20)
BUN: 13 mg/dL (ref 6–20)
BUN: 16 mg/dL (ref 6–20)
BUN: 15 mg/dL (ref 6–20)
BUN: 15 mg/dL (ref 6–20)
BUN: 13 mg/dL (ref 6–20)
CALCIUM ION: 0.91 mmol/L — AB (ref 1.15–1.40)
CALCIUM ION: 0.87 mmol/L — AB (ref 1.15–1.40)
CHLORIDE: 104 mmol/L (ref 101–111)
CHLORIDE: 105 mmol/L (ref 101–111)
CHLORIDE: 101 mmol/L (ref 101–111)
CREATININE: 0.8 mg/dL (ref 0.44–1.00)
CREATININE: 0.6 mg/dL (ref 0.44–1.00)
CREATININE: 0.7 mg/dL (ref 0.44–1.00)
Calcium, Ion: 1.31 mmol/L (ref 1.15–1.40)
Calcium, Ion: 1.26 mmol/L (ref 1.15–1.40)
Calcium, Ion: 0.93 mmol/L — ABNORMAL LOW (ref 1.15–1.40)
Calcium, Ion: 1.02 mmol/L — ABNORMAL LOW (ref 1.15–1.40)
Calcium, Ion: 0.74 mmol/L — CL (ref 1.15–1.40)
Chloride: 98 mmol/L — ABNORMAL LOW (ref 101–111)
Chloride: 100 mmol/L — ABNORMAL LOW (ref 101–111)
Chloride: 104 mmol/L (ref 101–111)
Chloride: 98 mmol/L — ABNORMAL LOW (ref 101–111)
Creatinine, Ser: 0.8 mg/dL (ref 0.44–1.00)
Creatinine, Ser: 0.5 mg/dL (ref 0.44–1.00)
Creatinine, Ser: 0.6 mg/dL (ref 0.44–1.00)
Creatinine, Ser: 0.7 mg/dL (ref 0.44–1.00)
GLUCOSE: 93 mg/dL (ref 65–99)
GLUCOSE: 132 mg/dL — AB (ref 65–99)
GLUCOSE: 143 mg/dL — AB (ref 65–99)
Glucose, Bld: 108 mg/dL — ABNORMAL HIGH (ref 65–99)
Glucose, Bld: 107 mg/dL — ABNORMAL HIGH (ref 65–99)
Glucose, Bld: 159 mg/dL — ABNORMAL HIGH (ref 65–99)
Glucose, Bld: 130 mg/dL — ABNORMAL HIGH (ref 65–99)
HCT: 24 % — ABNORMAL LOW (ref 36.0–46.0)
HCT: 20 % — ABNORMAL LOW (ref 36.0–46.0)
HCT: 21 % — ABNORMAL LOW (ref 36.0–46.0)
HCT: 26 % — ABNORMAL LOW (ref 36.0–46.0)
HEMATOCRIT: 34 % — AB (ref 36.0–46.0)
HEMATOCRIT: 32 % — AB (ref 36.0–46.0)
HEMATOCRIT: 20 % — AB (ref 36.0–46.0)
HEMOGLOBIN: 6.8 g/dL — AB (ref 12.0–15.0)
HEMOGLOBIN: 7.1 g/dL — AB (ref 12.0–15.0)
HEMOGLOBIN: 8.8 g/dL — AB (ref 12.0–15.0)
HEMOGLOBIN: 6.8 g/dL — AB (ref 12.0–15.0)
Hemoglobin: 11.6 g/dL — ABNORMAL LOW (ref 12.0–15.0)
Hemoglobin: 10.9 g/dL — ABNORMAL LOW (ref 12.0–15.0)
Hemoglobin: 8.2 g/dL — ABNORMAL LOW (ref 12.0–15.0)
POTASSIUM: 3.3 mmol/L — AB (ref 3.5–5.1)
POTASSIUM: 3.1 mmol/L — AB (ref 3.5–5.1)
Potassium: 3.4 mmol/L — ABNORMAL LOW (ref 3.5–5.1)
Potassium: 4.6 mmol/L (ref 3.5–5.1)
Potassium: 3.3 mmol/L — ABNORMAL LOW (ref 3.5–5.1)
Potassium: 4.4 mmol/L (ref 3.5–5.1)
Potassium: 3.2 mmol/L — ABNORMAL LOW (ref 3.5–5.1)
SODIUM: 141 mmol/L (ref 135–145)
SODIUM: 138 mmol/L (ref 135–145)
Sodium: 142 mmol/L (ref 135–145)
Sodium: 141 mmol/L (ref 135–145)
Sodium: 141 mmol/L (ref 135–145)
Sodium: 137 mmol/L (ref 135–145)
Sodium: 140 mmol/L (ref 135–145)
TCO2: 28 mmol/L (ref 0–100)
TCO2: 28 mmol/L (ref 0–100)
TCO2: 30 mmol/L (ref 0–100)
TCO2: 28 mmol/L (ref 0–100)
TCO2: 31 mmol/L (ref 0–100)
TCO2: 28 mmol/L (ref 0–100)
TCO2: 23 mmol/L (ref 0–100)

## 2016-10-20 LAB — DIC (DISSEMINATED INTRAVASCULAR COAGULATION)PANEL
D-Dimer, Quant: 0.65 ug/mL-FEU — ABNORMAL HIGH (ref 0.00–0.50)
Fibrinogen: 226 mg/dL (ref 210–475)
Platelets: 96 10*3/uL — ABNORMAL LOW (ref 150–400)
Prothrombin Time: 17.1 seconds — ABNORMAL HIGH (ref 11.4–15.2)
Smear Review: NONE SEEN

## 2016-10-20 LAB — APTT: APTT: 37 s — AB (ref 24–36)

## 2016-10-20 LAB — PLATELET COUNT: PLATELETS: 88 10*3/uL — AB (ref 150–400)

## 2016-10-20 LAB — DIC (DISSEMINATED INTRAVASCULAR COAGULATION) PANEL
APTT: 41 s — AB (ref 24–36)
INR: 1.38

## 2016-10-20 LAB — CBC
HCT: 23.7 % — ABNORMAL LOW (ref 36.0–46.0)
Hemoglobin: 8.1 g/dL — ABNORMAL LOW (ref 12.0–15.0)
MCH: 28 pg (ref 26.0–34.0)
MCHC: 34.2 g/dL (ref 30.0–36.0)
MCV: 82 fL (ref 78.0–100.0)
Platelets: 171 10*3/uL (ref 150–400)
RBC: 2.89 MIL/uL — AB (ref 3.87–5.11)
RDW: 15.2 % (ref 11.5–15.5)
WBC: 8 10*3/uL (ref 4.0–10.5)

## 2016-10-20 LAB — GLUCOSE, CAPILLARY
GLUCOSE-CAPILLARY: 102 mg/dL — AB (ref 65–99)
GLUCOSE-CAPILLARY: 105 mg/dL — AB (ref 65–99)
Glucose-Capillary: 108 mg/dL — ABNORMAL HIGH (ref 65–99)
Glucose-Capillary: 110 mg/dL — ABNORMAL HIGH (ref 65–99)
Glucose-Capillary: 163 mg/dL — ABNORMAL HIGH (ref 65–99)

## 2016-10-20 LAB — PROTIME-INR
INR: 0.82
PROTHROMBIN TIME: 11.3 s — AB (ref 11.4–15.2)

## 2016-10-20 LAB — HEMOGLOBIN AND HEMATOCRIT, BLOOD
HEMATOCRIT: 28 % — AB (ref 36.0–46.0)
HEMOGLOBIN: 9.4 g/dL — AB (ref 12.0–15.0)

## 2016-10-20 LAB — PREPARE RBC (CROSSMATCH)

## 2016-10-20 LAB — FIBRINOGEN: FIBRINOGEN: 111 mg/dL — AB (ref 210–475)

## 2016-10-20 SURGERY — BENTALL PROCEDURE
Anesthesia: General | Site: Chest

## 2016-10-20 MED ORDER — HEPARIN SODIUM (PORCINE) 1000 UNIT/ML IJ SOLN
INTRAMUSCULAR | Status: DC | PRN
  Administered 2016-10-20: 18000 [IU] via INTRAVENOUS
  Administered 2016-10-20: 10000 [IU] via INTRAVENOUS

## 2016-10-20 MED ORDER — LACTATED RINGERS IV SOLN
INTRAVENOUS | Status: DC | PRN
  Administered 2016-10-20: 07:00:00 via INTRAVENOUS

## 2016-10-20 MED ORDER — SODIUM CHLORIDE 0.9 % IV SOLN: Freq: Once | INTRAVENOUS | Status: DC

## 2016-10-20 MED ORDER — CALCIUM CHLORIDE 10 % IV SOLN
INTRAVENOUS | Status: DC | PRN
  Administered 2016-10-20 (×3): 100 mg via INTRAVENOUS
  Administered 2016-10-20: 300 mg via INTRAVENOUS

## 2016-10-20 MED ORDER — PANTOPRAZOLE SODIUM 40 MG PO TBEC
40.0000 mg | DELAYED_RELEASE_TABLET | Freq: Every day | ORAL | Status: DC
Start: 2016-10-22 — End: 2016-10-25
  Administered 2016-10-22 – 2016-10-25 (×4): 40 mg via ORAL
  Filled 2016-10-20 (×6): qty 1

## 2016-10-20 MED ORDER — CHLORHEXIDINE GLUCONATE 0.12 % MT SOLN
15.0000 mL | Freq: Once | OROMUCOSAL | Status: AC
  Administered 2016-10-20: 15 mL via OROMUCOSAL
  Filled 2016-10-20: qty 15

## 2016-10-20 MED ORDER — ASPIRIN EC 325 MG PO TBEC
325.0000 mg | DELAYED_RELEASE_TABLET | Freq: Every day | ORAL | Status: DC
  Administered 2016-10-21 – 2016-10-25 (×5): 325 mg via ORAL
  Filled 2016-10-20 (×7): qty 1

## 2016-10-20 MED ORDER — NOREPINEPHRINE BITARTRATE 1 MG/ML IV SOLN
INTRAVENOUS | Status: DC | PRN
  Administered 2016-10-20: 5 ug/min via INTRAVENOUS

## 2016-10-20 MED ORDER — HEMOSTATIC AGENTS (NO CHARGE) OPTIME
TOPICAL | Status: DC | PRN
  Administered 2016-10-20 (×2): 1 via TOPICAL

## 2016-10-20 MED ORDER — DEXMEDETOMIDINE HCL IN NACL 200 MCG/50ML IV SOLN
0.0000 ug/kg/h | INTRAVENOUS | Status: DC
  Administered 2016-10-20: 0.7 ug/kg/h via INTRAVENOUS
  Filled 2016-10-20: qty 50

## 2016-10-20 MED ORDER — INSULIN REGULAR BOLUS VIA INFUSION
0.0000 [IU] | Freq: Three times a day (TID) | INTRAVENOUS | Status: DC
  Filled 2016-10-20: qty 10

## 2016-10-20 MED ORDER — LACTATED RINGERS IV SOLN: 500.0000 mL | Freq: Once | INTRAVENOUS | Status: DC | PRN

## 2016-10-20 MED ORDER — CHLORHEXIDINE GLUCONATE 0.12 % MT SOLN
15.0000 mL | OROMUCOSAL | Status: AC
  Administered 2016-10-20: 15 mL via OROMUCOSAL
  Filled 2016-10-20: qty 15

## 2016-10-20 MED ORDER — CHLORHEXIDINE GLUCONATE 0.12% ORAL RINSE (MEDLINE KIT)
15.0000 mL | Freq: Two times a day (BID) | OROMUCOSAL | Status: DC
  Administered 2016-10-20 – 2016-10-25 (×3): 15 mL via OROMUCOSAL

## 2016-10-20 MED ORDER — LACTATED RINGERS IV SOLN: INTRAVENOUS | Status: DC

## 2016-10-20 MED ORDER — THROMBIN 20000 UNITS EX SOLR
CUTANEOUS | Status: DC | PRN
  Administered 2016-10-20: 20000 [IU] via TOPICAL

## 2016-10-20 MED ORDER — ARTIFICIAL TEARS OP OINT
TOPICAL_OINTMENT | OPHTHALMIC | Status: DC | PRN
  Administered 2016-10-20: 1 via OPHTHALMIC

## 2016-10-20 MED ORDER — FENTANYL CITRATE (PF) 250 MCG/5ML IJ SOLN
INTRAMUSCULAR | Status: AC
  Filled 2016-10-20: qty 5

## 2016-10-20 MED ORDER — ACETAMINOPHEN 500 MG PO TABS
1000.0000 mg | ORAL_TABLET | Freq: Four times a day (QID) | ORAL | Status: DC
  Administered 2016-10-21 – 2016-10-24 (×9): 1000 mg via ORAL
  Filled 2016-10-20 (×14): qty 2

## 2016-10-20 MED ORDER — ROCURONIUM BROMIDE 50 MG/5ML IV SOSY
PREFILLED_SYRINGE | INTRAVENOUS | Status: AC
  Filled 2016-10-20: qty 5

## 2016-10-20 MED ORDER — ROCURONIUM BROMIDE 50 MG/5ML IV SOSY
PREFILLED_SYRINGE | INTRAVENOUS | Status: AC
  Filled 2016-10-20: qty 10

## 2016-10-20 MED ORDER — MORPHINE SULFATE (PF) 2 MG/ML IV SOLN
2.0000 mg | INTRAVENOUS | Status: DC | PRN
  Administered 2016-10-21 (×2): 2 mg via INTRAVENOUS
  Filled 2016-10-20 (×3): qty 1

## 2016-10-20 MED ORDER — METHYLPREDNISOLONE SODIUM SUCC 125 MG IJ SOLR
INTRAMUSCULAR | Status: DC | PRN
  Administered 2016-10-20: 125 mg via INTRAVENOUS

## 2016-10-20 MED ORDER — LACTATED RINGERS IV SOLN
INTRAVENOUS | Status: DC | PRN
  Administered 2016-10-20: 09:00:00 via INTRAVENOUS

## 2016-10-20 MED ORDER — FENTANYL CITRATE (PF) 250 MCG/5ML IJ SOLN
INTRAMUSCULAR | Status: AC
  Filled 2016-10-20: qty 25

## 2016-10-20 MED ORDER — PROPOFOL 10 MG/ML IV BOLUS
INTRAVENOUS | Status: DC | PRN
  Administered 2016-10-20: 70 mg via INTRAVENOUS

## 2016-10-20 MED ORDER — SODIUM CHLORIDE 0.9 % IV SOLN
250.0000 mL | INTRAVENOUS | Status: DC
  Administered 2016-10-20: 250 mL via INTRAVENOUS

## 2016-10-20 MED ORDER — METOPROLOL TARTRATE 5 MG/5ML IV SOLN
2.5000 mg | INTRAVENOUS | Status: DC | PRN
  Filled 2016-10-20: qty 5

## 2016-10-20 MED ORDER — SODIUM CHLORIDE 0.45 % IV SOLN: INTRAVENOUS | Status: DC | PRN

## 2016-10-20 MED ORDER — TRAMADOL HCL 50 MG PO TABS
50.0000 mg | ORAL_TABLET | ORAL | Status: DC | PRN
  Administered 2016-10-21: 50 mg via ORAL
  Filled 2016-10-20: qty 1

## 2016-10-20 MED ORDER — HEPARIN SODIUM (PORCINE) 1000 UNIT/ML IJ SOLN
INTRAMUSCULAR | Status: AC
  Filled 2016-10-20: qty 1

## 2016-10-20 MED ORDER — NOREPINEPHRINE BITARTRATE 1 MG/ML IV SOLN
0.0000 ug/min | INTRAVENOUS | Status: DC
  Filled 2016-10-20: qty 4

## 2016-10-20 MED ORDER — 0.9 % SODIUM CHLORIDE (POUR BTL) OPTIME
TOPICAL | Status: DC | PRN
  Administered 2016-10-20: 5000 mL

## 2016-10-20 MED ORDER — SODIUM CHLORIDE 0.9% FLUSH
3.0000 mL | Freq: Two times a day (BID) | INTRAVENOUS | Status: DC
  Administered 2016-10-21 – 2016-10-24 (×4): 3 mL via INTRAVENOUS

## 2016-10-20 MED ORDER — CHLORHEXIDINE GLUCONATE 4 % EX LIQD: 30.0000 mL | CUTANEOUS | Status: DC

## 2016-10-20 MED ORDER — FAMOTIDINE IN NACL 20-0.9 MG/50ML-% IV SOLN
20.0000 mg | Freq: Two times a day (BID) | INTRAVENOUS | Status: AC
  Administered 2016-10-20: 20 mg via INTRAVENOUS
  Filled 2016-10-20: qty 50

## 2016-10-20 MED ORDER — METOPROLOL TARTRATE 12.5 MG HALF TABLET
12.5000 mg | ORAL_TABLET | Freq: Two times a day (BID) | ORAL | Status: DC
  Administered 2016-10-21 – 2016-10-23 (×5): 12.5 mg via ORAL
  Filled 2016-10-20 (×7): qty 1

## 2016-10-20 MED ORDER — DOCUSATE SODIUM 100 MG PO CAPS
200.0000 mg | ORAL_CAPSULE | Freq: Every day | ORAL | Status: DC
  Administered 2016-10-21 – 2016-10-23 (×3): 200 mg via ORAL
  Filled 2016-10-20 (×5): qty 2

## 2016-10-20 MED ORDER — ASPIRIN 81 MG PO CHEW
324.0000 mg | CHEWABLE_TABLET | Freq: Every day | ORAL | Status: DC
  Filled 2016-10-20: qty 4

## 2016-10-20 MED ORDER — THROMBIN 20000 UNITS EX SOLR
CUTANEOUS | Status: AC
  Filled 2016-10-20: qty 20000

## 2016-10-20 MED ORDER — SODIUM CHLORIDE 0.9 % IV SOLN
INTRAVENOUS | Status: DC | PRN
  Administered 2016-10-20: 15:00:00 via INTRAVENOUS

## 2016-10-20 MED ORDER — ACETAMINOPHEN 160 MG/5ML PO SOLN
1000.0000 mg | Freq: Four times a day (QID) | ORAL | Status: DC
  Filled 2016-10-20: qty 40

## 2016-10-20 MED ORDER — MIDAZOLAM HCL 2 MG/2ML IJ SOLN
2.0000 mg | INTRAMUSCULAR | Status: DC | PRN
Start: 2016-10-20 — End: 2016-10-21

## 2016-10-20 MED ORDER — NITROGLYCERIN IN D5W 200-5 MCG/ML-% IV SOLN
0.0000 ug/min | INTRAVENOUS | Status: DC
  Filled 2016-10-20: qty 250

## 2016-10-20 MED ORDER — ONDANSETRON HCL 4 MG/2ML IJ SOLN
4.0000 mg | Freq: Four times a day (QID) | INTRAMUSCULAR | Status: DC | PRN
  Administered 2016-10-21 (×2): 4 mg via INTRAVENOUS
  Filled 2016-10-20 (×3): qty 2

## 2016-10-20 MED ORDER — ALBUMIN HUMAN 5 % IV SOLN
250.0000 mL | INTRAVENOUS | Status: AC | PRN
  Administered 2016-10-21 (×2): 250 mL via INTRAVENOUS
  Filled 2016-10-20 (×2): qty 250

## 2016-10-20 MED ORDER — ROCURONIUM BROMIDE 10 MG/ML (PF) SYRINGE
PREFILLED_SYRINGE | INTRAVENOUS | Status: DC | PRN
  Administered 2016-10-20: 10 mg via INTRAVENOUS
  Administered 2016-10-20 (×2): 50 mg via INTRAVENOUS
  Administered 2016-10-20: 20 mg via INTRAVENOUS
  Administered 2016-10-20: 50 mg via INTRAVENOUS

## 2016-10-20 MED ORDER — PROTAMINE SULFATE 10 MG/ML IV SOLN
INTRAVENOUS | Status: DC | PRN
  Administered 2016-10-20 (×7): 25 mg via INTRAVENOUS
  Administered 2016-10-20: 5 mg via INTRAVENOUS

## 2016-10-20 MED ORDER — PROTAMINE SULFATE 10 MG/ML IV SOLN
INTRAVENOUS | Status: AC
  Filled 2016-10-20: qty 25

## 2016-10-20 MED ORDER — GELATIN ABSORBABLE MT POWD
OROMUCOSAL | Status: DC | PRN
  Administered 2016-10-20: 09:00:00 via TOPICAL

## 2016-10-20 MED ORDER — MORPHINE SULFATE (PF) 2 MG/ML IV SOLN
1.0000 mg | INTRAVENOUS | Status: AC | PRN
  Administered 2016-10-21: 2 mg via INTRAVENOUS

## 2016-10-20 MED ORDER — PHENYLEPHRINE HCL 10 MG/ML IJ SOLN
INTRAMUSCULAR | Status: DC | PRN
  Administered 2016-10-20 (×2): 40 ug via INTRAVENOUS

## 2016-10-20 MED ORDER — MIDAZOLAM HCL 5 MG/5ML IJ SOLN
INTRAMUSCULAR | Status: DC | PRN
  Administered 2016-10-20: 2 mg via INTRAVENOUS
  Administered 2016-10-20: 3 mg via INTRAVENOUS
  Administered 2016-10-20 (×5): 1 mg via INTRAVENOUS

## 2016-10-20 MED ORDER — ACETAMINOPHEN 650 MG RE SUPP
650.0000 mg | Freq: Once | RECTAL | Status: AC
  Administered 2016-10-20: 650 mg via RECTAL
  Filled 2016-10-20: qty 1

## 2016-10-20 MED ORDER — SODIUM CHLORIDE 0.9 % IV SOLN
INTRAVENOUS | Status: DC
  Administered 2016-10-20: 1.4 [IU]/h via INTRAVENOUS
  Filled 2016-10-20 (×2): qty 2.5

## 2016-10-20 MED ORDER — OXYCODONE HCL 5 MG PO TABS
5.0000 mg | ORAL_TABLET | ORAL | Status: DC | PRN
  Administered 2016-10-22 – 2016-10-23 (×2): 5 mg via ORAL
  Filled 2016-10-20 (×2): qty 1

## 2016-10-20 MED ORDER — BISACODYL 5 MG PO TBEC
10.0000 mg | DELAYED_RELEASE_TABLET | Freq: Every day | ORAL | Status: DC
  Administered 2016-10-21 – 2016-10-23 (×3): 10 mg via ORAL
  Filled 2016-10-20 (×5): qty 2

## 2016-10-20 MED ORDER — SODIUM CHLORIDE 0.9 % IV SOLN: Freq: Once | INTRAVENOUS | Status: AC

## 2016-10-20 MED ORDER — ETOMIDATE 2 MG/ML IV SOLN
INTRAVENOUS | Status: DC | PRN
  Administered 2016-10-20: 6 mg via INTRAVENOUS
  Administered 2016-10-20: 4 mg via INTRAVENOUS

## 2016-10-20 MED ORDER — ACETAMINOPHEN 160 MG/5ML PO SOLN
650.0000 mg | Freq: Once | ORAL | Status: AC
  Filled 2016-10-20: qty 20.3

## 2016-10-20 MED ORDER — PHENYLEPHRINE HCL 10 MG/ML IJ SOLN
0.0000 ug/min | INTRAMUSCULAR | Status: DC
  Filled 2016-10-20: qty 2

## 2016-10-20 MED ORDER — ORAL CARE MOUTH RINSE
15.0000 mL | Freq: Four times a day (QID) | OROMUCOSAL | Status: DC
  Administered 2016-10-21 – 2016-10-22 (×2): 15 mL via OROMUCOSAL

## 2016-10-20 MED ORDER — VANCOMYCIN HCL IN DEXTROSE 1-5 GM/200ML-% IV SOLN
1000.0000 mg | Freq: Once | INTRAVENOUS | Status: AC
  Administered 2016-10-20: 1000 mg via INTRAVENOUS
  Filled 2016-10-20: qty 200

## 2016-10-20 MED ORDER — SODIUM CHLORIDE 0.9% FLUSH: 3.0000 mL | INTRAVENOUS | Status: DC | PRN

## 2016-10-20 MED ORDER — METOPROLOL TARTRATE 25 MG/10 ML ORAL SUSPENSION
12.5000 mg | Freq: Two times a day (BID) | ORAL | Status: DC
  Filled 2016-10-20: qty 5

## 2016-10-20 MED ORDER — CEFUROXIME SODIUM 1.5 G IJ SOLR
1.5000 g | Freq: Two times a day (BID) | INTRAMUSCULAR | Status: DC
  Administered 2016-10-21 – 2016-10-22 (×3): 1.5 g via INTRAVENOUS
  Filled 2016-10-20 (×4): qty 1.5

## 2016-10-20 MED ORDER — MIDAZOLAM HCL 10 MG/2ML IJ SOLN
INTRAMUSCULAR | Status: AC
  Filled 2016-10-20: qty 2

## 2016-10-20 MED ORDER — SODIUM CHLORIDE 0.9 % IV SOLN: INTRAVENOUS | Status: DC

## 2016-10-20 MED ORDER — MAGNESIUM SULFATE 4 GM/100ML IV SOLN
4.0000 g | Freq: Once | INTRAVENOUS | Status: AC
  Administered 2016-10-20: 4 g via INTRAVENOUS
  Filled 2016-10-20: qty 100

## 2016-10-20 MED ORDER — FUROSEMIDE 10 MG/ML IJ SOLN
40.0000 mg | Freq: Four times a day (QID) | INTRAMUSCULAR | Status: AC
  Administered 2016-10-20 – 2016-10-21 (×2): 40 mg via INTRAVENOUS
  Filled 2016-10-20: qty 4

## 2016-10-20 MED ORDER — SODIUM CHLORIDE 0.9 % IV SOLN
30.0000 meq | Freq: Once | INTRAVENOUS | Status: AC
  Administered 2016-10-20: 30 meq via INTRAVENOUS
  Filled 2016-10-20: qty 15

## 2016-10-20 MED ORDER — PROPOFOL 10 MG/ML IV BOLUS
INTRAVENOUS | Status: AC
  Filled 2016-10-20: qty 20

## 2016-10-20 MED ORDER — SODIUM CHLORIDE 0.9 % IV SOLN
Freq: Once | INTRAVENOUS | Status: AC
  Administered 2016-10-20: 21:00:00 via INTRAVENOUS

## 2016-10-20 MED ORDER — COAGULATION FACTOR VIIA RECOMB 1 MG IV SOLR
2000.0000 ug | Freq: Once | INTRAVENOUS | Status: AC
  Administered 2016-10-20: 2000 ug via INTRAVENOUS
  Filled 2016-10-20: qty 2

## 2016-10-20 MED ORDER — BISACODYL 10 MG RE SUPP
10.0000 mg | Freq: Every day | RECTAL | Status: DC
  Filled 2016-10-20: qty 1

## 2016-10-20 MED ORDER — FENTANYL CITRATE (PF) 250 MCG/5ML IJ SOLN
INTRAMUSCULAR | Status: DC | PRN
  Administered 2016-10-20: 50 ug via INTRAVENOUS
  Administered 2016-10-20: 150 ug via INTRAVENOUS
  Administered 2016-10-20 (×2): 25 ug via INTRAVENOUS
  Administered 2016-10-20: 50 ug via INTRAVENOUS
  Administered 2016-10-20: 250 ug via INTRAVENOUS
  Administered 2016-10-20: 100 ug via INTRAVENOUS
  Administered 2016-10-20: 150 ug via INTRAVENOUS
  Administered 2016-10-20 (×2): 50 ug via INTRAVENOUS
  Administered 2016-10-20 (×2): 100 ug via INTRAVENOUS
  Administered 2016-10-20: 250 ug via INTRAVENOUS

## 2016-10-20 MED ORDER — THROMBIN 20000 UNITS EX SOLR
OROMUCOSAL | Status: DC | PRN
  Administered 2016-10-20: 09:00:00 via TOPICAL

## 2016-10-20 MED ORDER — METOPROLOL TARTRATE 12.5 MG HALF TABLET
12.5000 mg | ORAL_TABLET | Freq: Once | ORAL | Status: AC
  Administered 2016-10-20: 12.5 mg via ORAL
  Filled 2016-10-20: qty 1

## 2016-10-20 SURGICAL SUPPLY — 120 items
ADAPTER CARDIO PERF ANTE/RETRO (ADAPTER) ×3 IMPLANT
ADPR PRFSN 84XANTGRD RTRGD (ADAPTER) ×2
APL SRG 7X2 LUM MLBL SLNT (VASCULAR PRODUCTS) ×8
APPLICATOR TIP COSEAL (VASCULAR PRODUCTS) ×4 IMPLANT
ATTRACTOMAT 16X20 MAGNETIC DRP (DRAPES) ×3 IMPLANT
BAG DECANTER FOR FLEXI CONT (MISCELLANEOUS) ×3 IMPLANT
BLADE STERNUM SYSTEM 6 (BLADE) ×3 IMPLANT
BLADE SURG 15 STRL LF DISP TIS (BLADE) ×2 IMPLANT
BLADE SURG 15 STRL SS (BLADE) ×3
CANISTER SUCTION 2500CC (MISCELLANEOUS) ×3 IMPLANT
CANNULA AORTIC ROOT 9FR (CANNULA) ×1 IMPLANT
CANNULA GUNDRY RCSP 15FR (MISCELLANEOUS) ×3 IMPLANT
CANNULA MC2 2 STG 36/46 NON-V (CANNULA) IMPLANT
CANNULA SUMP PERICARDIAL (CANNULA) ×1 IMPLANT
CANNULA VENOUS 2 STG 34/46 (CANNULA) ×1
CATH HEART VENT LEFT (CATHETERS) IMPLANT
CATH ROBINSON RED A/P 18FR (CATHETERS) ×6 IMPLANT
CATH THORACIC 28FR (CATHETERS) ×1 IMPLANT
CATH THORACIC 36FR (CATHETERS) ×3 IMPLANT
CATH THORACIC 36FR RT ANG (CATHETERS) ×3 IMPLANT
CAUTERY HIGH TEMP VAS (MISCELLANEOUS) ×3 IMPLANT
CAUTERY SURG HI TEMP FINE TIP (MISCELLANEOUS) ×1 IMPLANT
CONN ST 1/4X3/8  BEN (MISCELLANEOUS) ×2
CONN ST 1/4X3/8 BEN (MISCELLANEOUS) IMPLANT
CONN Y 3/8X3/8X3/8  BEN (MISCELLANEOUS) ×1
CONN Y 3/8X3/8X3/8 BEN (MISCELLANEOUS) IMPLANT
CONT SPEC 4OZ CLIKSEAL STRL BL (MISCELLANEOUS) ×2 IMPLANT
CONT SPEC STER OR (MISCELLANEOUS) ×3 IMPLANT
COVER MAYO STAND STRL (DRAPES) ×1 IMPLANT
COVER SURGICAL LIGHT HANDLE (MISCELLANEOUS) ×4 IMPLANT
CRADLE DONUT ADULT HEAD (MISCELLANEOUS) ×3 IMPLANT
DRAPE SLUSH/WARMER DISC (DRAPES) IMPLANT
DRSG COVADERM 4X14 (GAUZE/BANDAGES/DRESSINGS) ×3 IMPLANT
DRSG COVADERM 4X6 (GAUZE/BANDAGES/DRESSINGS) ×1 IMPLANT
ELECT CAUTERY BLADE 6.4 (BLADE) ×3 IMPLANT
ELECT REM PT RETURN 9FT ADLT (ELECTROSURGICAL) ×6
ELECTRODE REM PT RTRN 9FT ADLT (ELECTROSURGICAL) ×4 IMPLANT
FELT TEFLON 1X6 (MISCELLANEOUS) ×4 IMPLANT
GAUZE SPONGE 4X4 12PLY STRL (GAUZE/BANDAGES/DRESSINGS) ×3 IMPLANT
GLOVE BIO SURGEON STRL SZ 6 (GLOVE) IMPLANT
GLOVE BIO SURGEON STRL SZ 6.5 (GLOVE) IMPLANT
GLOVE BIO SURGEON STRL SZ7 (GLOVE) IMPLANT
GLOVE BIO SURGEON STRL SZ7.5 (GLOVE) IMPLANT
GLOVE BIOGEL PI IND STRL 6 (GLOVE) IMPLANT
GLOVE BIOGEL PI IND STRL 6.5 (GLOVE) IMPLANT
GLOVE BIOGEL PI IND STRL 7.0 (GLOVE) IMPLANT
GLOVE BIOGEL PI INDICATOR 6 (GLOVE) ×1
GLOVE BIOGEL PI INDICATOR 6.5 (GLOVE) ×9
GLOVE BIOGEL PI INDICATOR 7.0 (GLOVE) ×3
GLOVE EUDERMIC 7 POWDERFREE (GLOVE) ×6 IMPLANT
GLOVE SURG SS PI 6.5 STRL IVOR (GLOVE) ×1 IMPLANT
GOWN STRL REUS W/ TWL LRG LVL3 (GOWN DISPOSABLE) ×8 IMPLANT
GOWN STRL REUS W/ TWL XL LVL3 (GOWN DISPOSABLE) ×2 IMPLANT
GOWN STRL REUS W/TWL LRG LVL3 (GOWN DISPOSABLE) ×42
GOWN STRL REUS W/TWL XL LVL3 (GOWN DISPOSABLE) ×3
GRAFT 4 BRANCH 28X50 (Prosthesis & Implant Heart) ×1 IMPLANT
GRAFT HEMASHIELD 8MM (Vascular Products) ×3 IMPLANT
GRAFT VASC STRG 30X8KNIT (Vascular Products) IMPLANT
GRAFT WOVEN D/V 24DX30L (Vascular Products) ×1 IMPLANT
HEART VENT LT CURVED (MISCELLANEOUS) ×3 IMPLANT
HEMOSTAT POWDER SURGIFOAM 1G (HEMOSTASIS) ×9 IMPLANT
HEMOSTAT SURGICEL 2X14 (HEMOSTASIS) ×3 IMPLANT
INSERT FOGARTY SM (MISCELLANEOUS) ×3 IMPLANT
INSERT FOGARTY XLG (MISCELLANEOUS) ×1 IMPLANT
KIT BASIN OR (CUSTOM PROCEDURE TRAY) ×3 IMPLANT
KIT CATH CPB BARTLE (MISCELLANEOUS) ×3 IMPLANT
KIT ROOM TURNOVER OR (KITS) ×3 IMPLANT
KIT SUCTION CATH 14FR (SUCTIONS) ×5 IMPLANT
LINE VENT (MISCELLANEOUS) ×1 IMPLANT
LOOP VESSEL MAXI BLUE (MISCELLANEOUS) ×1 IMPLANT
LOOP VESSEL SUPERMAXI WHITE (MISCELLANEOUS) ×2 IMPLANT
NDL SUT 1 .5 CRC FRENCH EYE (NEEDLE) IMPLANT
NEEDLE FRENCH EYE (NEEDLE) ×3
NS IRRIG 1000ML POUR BTL (IV SOLUTION) ×15 IMPLANT
PACK OPEN HEART (CUSTOM PROCEDURE TRAY) ×3 IMPLANT
PAD ARMBOARD 7.5X6 YLW CONV (MISCELLANEOUS) ×6 IMPLANT
PUNCH AORTIC ROTATE  4.5MM 8IN (MISCELLANEOUS) ×1 IMPLANT
SEALANT SURG COSEAL 8ML (VASCULAR PRODUCTS) ×2 IMPLANT
SET CARDIOPLEGIA MPS 5001102 (MISCELLANEOUS) ×1 IMPLANT
SET VEIN GRAFT PERF (SET/KITS/TRAYS/PACK) ×1 IMPLANT
SPONGE LAP 18X18 X RAY DECT (DISPOSABLE) ×5 IMPLANT
SPONGE LAP 4X18 X RAY DECT (DISPOSABLE) ×1 IMPLANT
SUT BONE WAX W31G (SUTURE) ×3 IMPLANT
SUT ETHIBON 2 0 V 52N 30 (SUTURE) ×8 IMPLANT
SUT ETHIBON EXCEL 2-0 V-5 (SUTURE) ×1 IMPLANT
SUT ETHIBOND V-5 VALVE (SUTURE) ×1 IMPLANT
SUT PROLENE 3 0 SH 1 (SUTURE) ×3 IMPLANT
SUT PROLENE 3 0 SH 48 (SUTURE) ×4 IMPLANT
SUT PROLENE 3 0 SH DA (SUTURE) ×5 IMPLANT
SUT PROLENE 4 0 RB 1 (SUTURE) ×15
SUT PROLENE 4-0 RB1 .5 CRCL 36 (SUTURE) ×8 IMPLANT
SUT PROLENE 5 0 C 1 36 (SUTURE) ×6 IMPLANT
SUT PROLENE 5 0 RB 2 (SUTURE) ×6 IMPLANT
SUT PROLENE 6 0 C 1 30 (SUTURE) ×2 IMPLANT
SUT SILK 2 0 SH CR/8 (SUTURE) ×1 IMPLANT
SUT STEEL 6MS V (SUTURE) ×2 IMPLANT
SUT STEEL STERNAL CCS#1 18IN (SUTURE) IMPLANT
SUT STEEL SZ 6 DBL 3X14 BALL (SUTURE) IMPLANT
SUT VIC AB 1 CTX 36 (SUTURE) ×12
SUT VIC AB 1 CTX36XBRD ANBCTR (SUTURE) ×4 IMPLANT
SUT VIC AB 2-0 CT1 27 (SUTURE)
SUT VIC AB 2-0 CT1 TAPERPNT 27 (SUTURE) IMPLANT
SUT VIC AB 3-0 SH 27 (SUTURE) ×3
SUT VIC AB 3-0 SH 27X BRD (SUTURE) IMPLANT
SUT VIC AB 3-0 X1 27 (SUTURE) ×1 IMPLANT
SUTURE E-PAK OPEN HEART (SUTURE) ×3 IMPLANT
SYSTEM SAHARA CHEST DRAIN ATS (WOUND CARE) ×3 IMPLANT
TAPE CLOTH SURG 4X10 WHT LF (GAUZE/BANDAGES/DRESSINGS) ×1 IMPLANT
TAPE PAPER 2X10 WHT MICROPORE (GAUZE/BANDAGES/DRESSINGS) ×1 IMPLANT
TOWEL OR 17X24 6PK STRL BLUE (TOWEL DISPOSABLE) ×3 IMPLANT
TOWEL OR 17X26 10 PK STRL BLUE (TOWEL DISPOSABLE) ×3 IMPLANT
TRAY FOLEY IC TEMP SENS 14FR (CATHETERS) ×3 IMPLANT
TUBE CONNECTING 20X1/4 (TUBING) ×1 IMPLANT
TUBE SUCT INTRACARD DLP 20F (MISCELLANEOUS) ×1 IMPLANT
TUBE SUCTION CARDIAC 10FR (CANNULA) ×1 IMPLANT
UNDERPAD 30X30 (UNDERPADS AND DIAPERS) ×3 IMPLANT
VALVE AORTIC SZ 21 (Prosthesis & Implant Heart) ×1 IMPLANT
VENT LEFT HEART 12002 (CATHETERS) ×3
WATER STERILE IRR 1000ML POUR (IV SOLUTION) ×6 IMPLANT
YANKAUER SUCT BULB TIP NO VENT (SUCTIONS) ×3 IMPLANT

## 2016-10-20 NOTE — Progress Notes (Signed)
  Echocardiogram Echocardiogram Transesophageal has been performed.  Nancy Montoya 10/20/2016, 8:55 AM

## 2016-10-20 NOTE — Anesthesia Procedure Notes (Addendum)
Central Venous Catheter Insertion Performed by: Nolon Nations, anesthesiologist Start/End1/18/2018 6:35 AM, 10/20/2016 6:55 AM Patient location: Pre-op. Preanesthetic checklist: patient identified, IV checked, site marked, risks and benefits discussed, surgical consent, monitors and equipment checked, pre-op evaluation, timeout performed and anesthesia consent Position: Trendelenburg Lidocaine 1% used for infiltration and patient sedated Hand hygiene performed , maximum sterile barriers used  and Seldinger technique used Catheter size: 9 Fr Total catheter length 10. Central line and PA cath was placed.MAC introducer Swan type:thermodilution PA Cath depth:40 Procedure performed using ultrasound guided technique. Ultrasound Notes:anatomy identified, needle tip was noted to be adjacent to the nerve/plexus identified, no ultrasound evidence of intravascular and/or intraneural injection and image(s) printed for medical record Attempts: 1 Following insertion, line sutured and dressing applied. Post procedure assessment: blood return through all ports, free fluid flow and no air  Patient tolerated the procedure well with no immediate complications.

## 2016-10-20 NOTE — Interval H&P Note (Signed)
History and Physical Interval Note:  10/20/2016 5:45 AM  Nancy Montoya  has presented today for surgery, with the diagnosis of ASCENDING and ARCH ANEURYSM  The various methods of treatment have been discussed with the patient and family. After consideration of risks, benefits and other options for treatment, the patient has consented to  Procedure(s) with comments: BENTALL PROCEDURE (N/A) - RIGHT AXILLARY ARTERY CANNULATION  BILATERAL RADIAL ARTERIAL LINES REPLACEMENT ASCENDING ARCH (N/A) TRANSESOPHAGEAL ECHOCARDIOGRAM (TEE) (N/A) as a surgical intervention .  The patient's history has been reviewed, patient examined, no change in status, stable for surgery.  I have reviewed the patient's chart and labs.  Questions were answered to the patient's satisfaction.     Gaye Pollack

## 2016-10-20 NOTE — Anesthesia Procedure Notes (Signed)
Procedure Name: Intubation Date/Time: 10/20/2016 7:56 AM Performed by: Candis Shine Pre-anesthesia Checklist: Patient identified, Emergency Drugs available, Suction available and Patient being monitored Patient Re-evaluated:Patient Re-evaluated prior to inductionOxygen Delivery Method: Circle System Utilized Preoxygenation: Pre-oxygenation with 100% oxygen Intubation Type: IV induction Ventilation: Mask ventilation without difficulty Laryngoscope Size: Mac and 3 Grade View: Grade I Tube type: Oral Tube size: 7.5 mm Number of attempts: 1 Airway Equipment and Method: Stylet and Oral airway Placement Confirmation: ETT inserted through vocal cords under direct vision,  positive ETCO2 and breath sounds checked- equal and bilateral Secured at: 22 cm Tube secured with: Tape Dental Injury: Teeth and Oropharynx as per pre-operative assessment

## 2016-10-20 NOTE — Op Note (Signed)
CARDIOVASCULAR SURGERY OPERATIVE NOTE  07/17/2014  Surgeon:  Gaye Pollack, MD  First Assistant: Ellwood Handler,  PA-C   Preoperative Diagnosis:  Aortic root, ascending and arch aneurysm  Postoperative Diagnosis:  Same   Procedure:  1. Median Sternotomy 2. Right axillary artery cannulation using an 8 mm Hemashield graft. 3.   Extracorporeal circulation via the right axillary artery and right atrium 4.   Replacement of ascending aorta and aortic arch with aorto-innominate and aorto-left carotid bypass using a 28 x 10 x 8 x 8 x 10 mm Hemashield Platinum graft.  5.  Bentall procedure using a 21 mm Medtronic Freestyle Porcine Root. Reimplantation of left and right coronary arteries.   Anesthesia:  General Endotracheal  Anesthesiologist: Dr. Orene Desanctis   Clinical History/Surgical Indication:  The patient is a 71 year old woman with hypertension and a right neck lipoma who was recently evaluated by her PCP and it was felt that the neck mass may be larger. She says she doesn't really think about it. An ultrasound showed it measured 6.4 cm and a CT was recommended. A CT of the neck showed that it was a 7.2 cm right neck lipoma. It also showed a 6.1 cm distal ascending aortic aneurysm that extended out into the arch and proximal descending aorta where it was 4.5 cm. She denies any chest or back pain. She denies any neck pain. She has some mild shortness of breath with activity but none at rest. She denies peripheral edema. Her CTA shows aneurysmal dilation of the entire thoracic aorta with the ascending aorta measuring 6.2 cm. It was 3.8 cm on the previous CT in 2008. The aortic arch measures 4.3 cm and was 3.5 cm previously. The descending aorta is 4.2 cm and was 3.0 cm previously. There is mild supra-renal aneurysmal dilation to 3.3 cm. Her echo shows a trileaflet aortic valve with moderate AI and normal LV function. Cardiac cath shows no coronary disease and normal anatomy. My plan was a  Bentall procedure using a composite bioprosthetic valve/graft with replacement of the ascending aorta and arch using an elephant trunk graft to allow stent grafting of the descending aorta aneurysm in the future if it enlarges further. She will require grafts to the arch vessels. She will require right axillary artery cannulation and bilateral radial arterial lines for hemodynamic monitoring. I reviewed the CT scan images and echo findings with her and her husband and answered their questions. I discussed the operative procedure with the patient and her husbandincluding alternatives, benefits and risks; including but not limited to bleeding, blood transfusion, infection, stroke, myocardial infarction, graft failure, heart block requiring a permanent pacemaker, organ dysfunction, and death. Nancy F Marshunderstands and agrees to proceed.    TEE performed by Dr. Orene Desanctis:  Francine Graven  ECHO TEE (OR)   Echo Findings   Left Ventricle Normal cavity size, wall thickness and left ventricular diastolic function. LV systolic function is mildly reduced with an EF of 45-50%. There are no obvious wall motion abnormalities.    Left Atrium Left atrium normal in structure and function. No left atrial appendage.    Aortic Valve The valve is trileaflet. Mild valve thickening present. Mildly decreased leaflet separation. Moderate to severe regurgitation. Holodiastolic flow reversal in the descending thoracic aorta. Spontaneous contrast seen in Desc aorta No AV vegetation.    Mitral Valve Normal valve structure. No leaflet thickening and calcification present. No stenosis. Trace regurgitation. No prolapse present.    Right Ventricle Normal cavity size,  wall thickness and ejection fraction.    Right Atrium Normal right atrial size.    Tricuspid Valve Normal tricuspid valve structure. No regurgitation.    Post-Op TEE AORTA Aorta unchanged from pre-bypass: Bentall procedure c replaced ascending aorta.  LEFT  VENTRICLE Left ventricle unchanged from pre-bypass: Good overall contractility noted, mild LV septal dyssnergy appreciated.  RIGHT VENTRICLE Right ventricle unchanged from pre-bypass: mild diminishment RV function, mildly enlarged  AORTIC VALVE Aortic valve unchanged from pre-bypass: Aortic root and valve homograft in aortic position and functioning normally. MITRAL VALVE Mitral valve unchanged from pre-bypass: mild MR  TRICUSPID VALVE Tricuspid valve unchanged from pre-bypass: moderate TR noted after CPB , central jet . Consider dilated annulus post CPB vs functional TR.       Preparation:  The patient was seen in the preoperative holding area and the correct patient, correct operation were confirmed with the patient after reviewing the medical record and catheterization. The consent was signed by me. Preoperative antibiotics were given. A pulmonary arterial line and bilateral radial arterial lines were placed by the anesthesia team. The patient was taken back to the operating room and positioned supine on the operating room table. After being placed under general endotracheal anesthesia by the anesthesia team a foley catheter was placed. The neck, chest, abdomen, and both legs were prepped with betadine soap and solution and draped in the usual sterile manner. A surgical time-out was taken and the correct patient and operative procedure were confirmed with the nursing and anesthesia staff.    Right axillary artery exposure and cannulation:  A transverse incision was made below the right clavicle. The pectoralis major muscle was split along its fibers and the pectoralis minor muscle was retracted laterally. The brachial plexus was identified and gently retracted laterally to expose the axillary artery.  The artery was controlled proximally and distally with vessel loops. The patient was fully heparinized and ACT maintained greater than 400. The axillary artery was clamped proximally and  distally with peripheral Debakey clamps. It was opened longitudinally. The wall was thick, particularly the intima which looked edematous and inflammatory. There was no plaque in the wall. An 8 mm Hemashield dacron graft was anastomosed in an end to side manner using continuous 5-0 prolene suture. CoSeal was applied for hemostasis and the clamp removed. The graft was connected to the arterial end of the bypass circuit which had been divided into a Y to allow perfusion of the axillary artery and the new aortic graft via a sidearm.    Cardiopulmonary Bypass:  A median sternotomy was performed. The pericardium was opened in the midline. Right ventricular function appeared normal. The ascending aorta was markedly aneurysmal and had no palpable plaque. The brachiocephalic vein was stretched across the aneurysm. Venous cannulation was performed via the right atrial appendage using a two-staged venous cannula.  Systemic cooling to 20 degrees Centigrade and topical cooling of the heart with iced saline were used. Hyperkalemic antegrade cold blood cardioplegia was used to induce diastolic arrest and was then cold blood retrograde cardioplegia was given at about 20 minute intervals throughout the period of arrest to maintain myocardial temperature at or below 10 degrees centigrade. A temperature probe was inserted into the interventricular septum and an insulating pad was placed in the pericardium. CO2 was insufflated into the pericardium throughout the case to minimize intracardiac air.   Resection and grafting of aortic arch and ascending aorta:  The patient was placed on cardiopulmonary bypass and a left ventricular vent was  placed via the right superior pulmonary vein. Systemic cooling was begun with a goal temperature of 18 degrees centigrade by bladder and rectal temperature probes. A retrograde cardioplegia cannula was placed through the right atrium into the coronary sinus without difficulty. While cooling,  the innominate and left carotid arteries were dissected free and encircled with silastic tapes.  After 30 minutes of cooling the target temperature of 20 degrees centigrade was reached. Cerebral oximetry was 70% bilaterally. BIS was zero. The patient was given 10 mg of Etomidate and 125 mg of Solumedrol by anesthesia. The head was packed in ice. The bed was placed in steep trendelenburg. Circulatory arrest was begun and the blood volume emptied into the venous reservoir. The innominate and left carotid arteries were occluded with Satinsky clamps. Continuous antegrade cerebral perfusion was begun via the right axillary artery. Cold blood retrograde cardioplegia was given and myocardial temperature dropped to 10 degrees centigrade. Additional doses were given at approximately 20 minute intervals throughout the period of circulatory arrest and cross-clamping. Complete diastolic arrest was maintained. The aortic arch aneurysm was mobilized. The innominate and left carotid arteries were transected just distal to their origins. The aorta was initially divided in the proxima arch to allow evaluation of the internal lumen. It appeared that the most narrow part of the aortic arch was just beyond the origin of the left carotid artery. The aorta then dilated some and the aorta just beyond the left subclavian appeared aneurysmal. Therefore I changed my plan and instead of doing an elephant trunk beyond the left subclavian I though it would be better to do a straight branched graft to the aorta just proximal the left subclavian where it was more normal sized. The aorta was transected just proximal to the left subclavian artery. The aortic diameter was measured at 28-30 mm here. A 28 x 10 x 8 x 8 x 10 mm Hemashield Platinum vascular graft was prepared. ( REF [id number], Lot # [id number], [id number]). One of the 8 mm side arm grafts was ligated with a heavy silk tie and suture ligated with two layers of 3-0 prolene.  The end of  the 28 mm graft was anastomosed to the distal aortic arch in an end to end manner using 3-0 prolene continuous suture with a felt strip to reinforce the anastomisis. A light coating of CoSeal was applied to seal needle holes. The other end of the Y in the arterial end of the bypass circuit was then connected to the 69mm side arm graft. The two other sidearm grafts were controlled with vascular clamps.The aortic graft was cross-clamped proximal to the side arm grafts and full CPB support was resumed. Circulatory arrest time with antegrade cerebral perfusion was 47 minutes. Circulatory arrest time without antegrade cerebral perfusion was 4 minutes. The cerebral oximetry showed normal oximetry bilaterally throughout the case including during the circulatory arrest and antegrade cerebral perfusion. The patient was rewarmed slowly in stages to 37 degrees.   Aorto-left carotid and aorto-innominate bypass:  The 8 mm sidearm graft was brought under the bracheocephalic vein and anastomosed to the  left common carotid artery in an end to end manner using continuous 5-0 prolene suture. The clamps were removed from the left carotid and the graft. Then the 10 mm sidearm graft was brought under the bracheocephalic vein and cut to the appropriate length. It was anastomosed to the innominate artery in an end to end manner using continuous 5-0 prolene suture. The clamps were removed.    Bentall  Procedure:   The ascending aorta was mobilized from the right pulmonary artery and main PA. It was opened longitudinally and the valve inspected. It was a trileaflet valve. The leaflets had fenestrations at the commissures and some thickening along the central free edge portion of the leaflets. The right and left coronary arteries were removed from the aortic root with a button of aortic wall around the ostia. They were retracted carefully out of the way with stay sutures to prevent rotation. The native valve was excised taking  care to remove all particulate debri. The annulus was sized at 21 mm. We did not have a 24 mm Valsalva graft to use with a 21 mm pericardial valve and therefore I decided to use a Medtronic Freestyle Porcine Root ( Model 995, [place] [id number]). A series of pledgetted 2-0 Ethibond horizontal mattress sutures were placed around the annulus with the pledgets in a sub-annular position. The sutures were placed through a strip of autologous pericardium to reinforce the annulus and then the valve sewing ring. The valve was lowered into place and the sutures tied. The valve seated nicely. The porcine coronary stumps were transected with a knife to make openings for the coronary anastomoses.  Then the left and right coronary buttons were anastomosed to the porcine root in an end to side manner using continuous 5-0 prolene suture. A light coating of CoSeal was applied to each anastomosis for hemostasis. There was a large size discrepancy between the porcine root and the ascending aortic graft and therefore a 24 mm Hemashield straight graft ( REF [id number] P0, LOT [id number], [id number]) was anastomosed to the end of the porcine root in end to end manner using continuous 4-0 prolene suture. This graft and the ascending aortic graft were then cut to the appropriate length and anastomosed end to end using continuous 4-0 prolene suture. CoSeal was applied to seal the needle holes in the grafts. A vent cannula was placed into the graft to remove any air. Deairing maneuvers were performed and the bed placed in trendelenburg position.   Completion:   The crossclamp was removed with a time of 239 minutes. There was spontaneous return of ventricular fibrillation  and the patient was defibrillated to sinus rhythm.  The position of the grafts was satisfactory. The vascular anastomoses all appeared hemostatic. Two temporary epicardial pacing wires were placed on the right atrium and two on the right ventricle. The patient was weaned from  CPB without difficulty on no inotropes. CPB time was 284 minutes. Cardiac output was 4 LPM. TEE showed a normally functioning porcine aortic valve with no AI. LV function was preserved. Heparin was fully reversed with protamine and the venous cannula removed. The arterial sidearm graft and the axillary artery graft were ligated with a heavy silk tie and suture ligated with a pledgetted 3-0 prolene suture. Hemostasis was achieved but the patient had severe coagulopathy post-Protamine with a low platelet count of 88K, low fibrinogen of 111. She was given FFP, platelets and cryoprecipitate but was still oozy and therefor was given Novoseven 2000 mcg. This resulted in complete hemostasis.  Mediastinal and left pleural drainage tubes were placed. The sternum was closed with  #6 stainless steel wires. The fascia was closed with continuous # 1 vicryl suture. The subcutaneous tissue was closed with 2-0 vicryl continuous suture. The skin was closed with 3-0 vicryl subcuticular suture. The right subclavicular incision was closed in layers in a similar manner. All sponge, needle, and instrument counts were reported correct at  the end of the case. Dry sterile dressings were placed over the incisions and around the chest tubes which were connected to pleurevac suction. The patient was then transported to the surgical intensive care unit in critical but stable condition.

## 2016-10-20 NOTE — Progress Notes (Signed)
      [address]       [place] [address]             [phone]    S/p Bentall/ arch reconstruction   Intubated, sedated  recently back from OR  BP 105/66   Pulse 87   Temp 98 F (36.7 C) (Oral)   Resp 12   Ht 5\' 3"  (1.6 m)   Wt 152 lb (68.9 kg)   SpO2 100%   BMI 26.93 kg/m  Ci= 1.8   Intake/Output Summary (Last 24 hours) at 10/20/16 1928 Last data filed at 10/20/16 1845  Gross per 24 hour  Intake             6245 ml  Output             3035 ml  Net             3210 ml   Minimal CT output after coagulopathy corrected in OR  Hgb 8 post transfusion. Her filling pressures are low- will give another unit of blood  Remo Lipps C. Roxan Hockey, MD [facility] [phone]

## 2016-10-20 NOTE — Transfer of Care (Signed)
Immediate Anesthesia Transfer of Care Note  Patient: Nancy Montoya  Procedure(s) Performed: Procedure(s) with comments: BIOLOGIC BENTALL PROCEDURE -21 mm Medtronic Freestyle Porcine Valve Conduit (N/A) - RIGHT AXILLARY ARTERY CANNULATION  BILATERAL RADIAL ARTERIAL LINES REPLACEMENT ASCENDING AORTA WITH ARCH RECONSTRUCTION-Aorto-Inominate bypass, Aorto-Carotid Bypass.. With Hypothermic circulatory arrest (N/A) TRANSESOPHAGEAL ECHOCARDIOGRAM (TEE) (N/A)  Patient Location: SICU  Anesthesia Type:General  Level of Consciousness: sedated and Patient remains intubated per anesthesia plan  Airway & Oxygen Therapy: Patient remains intubated per anesthesia plan and Patient placed on Ventilator (see vital sign flow sheet for setting)  Post-op Assessment: Report given to RN  Post vital signs: Reviewed  Last Vitals:  Vitals:   10/20/16 0546  BP: (!) 150/71  Pulse: 74  Resp: 18  Temp: 36.7 C    Last Pain:  Vitals:   10/20/16 0546  TempSrc: Oral         Complications: No apparent anesthesia complications

## 2016-10-20 NOTE — Brief Op Note (Signed)
10/20/2016  2:32 PM  PATIENT:  Francine Graven  71 y.o. female  PRE-OPERATIVE DIAGNOSIS:  ASCENDING and ARCH ANEURYSM  POST-OPERATIVE DIAGNOSIS:  ASCENDING and ARCH ANEURYSM  PROCEDURE:    RIGHT AXILLARY ARTERY CANNULATION  MEDIAN STERNOTOMY  REPLACEMENT ASCENDING AORTA WITH ARCH RECONSTRUCTION-Aorto-Inominate bypass, Aorto-Carotid Bypass.. With Hypothermic circulatory arrest  BIOLOGIC BENTALL PROCEDURE -21 mm Medtronic Freestyle Porcine Valve Conduit  TRANSESOPHAGEAL ECHOCARDIOGRAM (TEE) (N/A)  SURGEON:  Surgeon(s) and Role:    * Gaye Pollack, MD - Primary  PHYSICIAN ASSISTANT: Ellwood Handler PA-C, Kelly Rayburn PA-student  ANESTHESIA:   general  EBL:  Total I/O In: 1150 [I.V.:1150] Out: 925 [Urine:925]  BLOOD ADMINISTERED:4 U PRBC, CELLSAVER, FFP and  PLTS  DRAINS: Mediastinal Chest Drains   LOCAL MEDICATIONS USED:  NONE  SPECIMEN:  Source of Specimen:  Aortic Arch Aneurysm  DISPOSITION OF SPECIMEN:  PATHOLOGY  COUNTS:  YES  TOURNIQUET:  * No tourniquets in log *  DICTATION: .Dragon Dictation  PLAN OF CARE: Admit to inpatient   PATIENT DISPOSITION:  ICU - intubated and hemodynamically stable.   Delay start of Pharmacological VTE agent (>24hrs) due to surgical blood loss or risk of bleeding: yes

## 2016-10-21 ENCOUNTER — Inpatient Hospital Stay (HOSPITAL_COMMUNITY): Payer: PPO

## 2016-10-21 ENCOUNTER — Encounter (HOSPITAL_COMMUNITY): Payer: Self-pay | Admitting: Surgery

## 2016-10-21 LAB — PREPARE FRESH FROZEN PLASMA
UNIT DIVISION: 0
UNIT DIVISION: 0
UNIT DIVISION: 0
Unit division: 0

## 2016-10-21 LAB — POCT I-STAT, CHEM 8
BUN: 12 mg/dL (ref 6–20)
BUN: 14 mg/dL (ref 6–20)
CHLORIDE: 105 mmol/L (ref 101–111)
Calcium, Ion: 0.83 mmol/L — CL (ref 1.15–1.40)
Calcium, Ion: 1.13 mmol/L — ABNORMAL LOW (ref 1.15–1.40)
Chloride: 102 mmol/L (ref 101–111)
Creatinine, Ser: 0.7 mg/dL (ref 0.44–1.00)
Creatinine, Ser: 1.1 mg/dL — ABNORMAL HIGH (ref 0.44–1.00)
Glucose, Bld: 126 mg/dL — ABNORMAL HIGH (ref 65–99)
Glucose, Bld: 141 mg/dL — ABNORMAL HIGH (ref 65–99)
HEMATOCRIT: 24 % — AB (ref 36.0–46.0)
HEMATOCRIT: 29 % — AB (ref 36.0–46.0)
HEMOGLOBIN: 8.2 g/dL — AB (ref 12.0–15.0)
Hemoglobin: 9.9 g/dL — ABNORMAL LOW (ref 12.0–15.0)
POTASSIUM: 3.8 mmol/L (ref 3.5–5.1)
Potassium: 3 mmol/L — ABNORMAL LOW (ref 3.5–5.1)
SODIUM: 143 mmol/L (ref 135–145)
SODIUM: 143 mmol/L (ref 135–145)
TCO2: 25 mmol/L (ref 0–100)
TCO2: 29 mmol/L (ref 0–100)

## 2016-10-21 LAB — POCT I-STAT 3, ART BLOOD GAS (G3+)
ACID-BASE DEFICIT: 1 mmol/L (ref 0.0–2.0)
Acid-base deficit: 2 mmol/L (ref 0.0–2.0)
BICARBONATE: 24.2 mmol/L (ref 20.0–28.0)
BICARBONATE: 25.1 mmol/L (ref 20.0–28.0)
Bicarbonate: 24.9 mmol/L (ref 20.0–28.0)
O2 SAT: 97 %
O2 Saturation: 97 %
O2 Saturation: 98 %
PCO2 ART: 44.8 mmHg (ref 32.0–48.0)
PCO2 ART: 43.2 mmHg (ref 32.0–48.0)
PH ART: 7.338 — AB (ref 7.350–7.450)
PH ART: 7.372 (ref 7.350–7.450)
PO2 ART: 100 mmHg (ref 83.0–108.0)
PO2 ART: 102 mmHg (ref 83.0–108.0)
PO2 ART: 89 mmHg (ref 83.0–108.0)
Patient temperature: 37.5
Patient temperature: 33.9
TCO2: 25 mmol/L (ref 0–100)
TCO2: 26 mmol/L (ref 0–100)
TCO2: 26 mmol/L (ref 0–100)
pCO2 arterial: 42.2 mmHg (ref 32.0–48.0)
pH, Arterial: 7.368 (ref 7.350–7.450)

## 2016-10-21 LAB — PREPARE CRYOPRECIPITATE
UNIT DIVISION: 0
UNIT DIVISION: 0
Unit division: 0

## 2016-10-21 LAB — BASIC METABOLIC PANEL WITH GFR
Anion gap: 10 (ref 5–15)
BUN: 13 mg/dL (ref 6–20)
CO2: 26 mmol/L (ref 22–32)
Calcium: 8.1 mg/dL — ABNORMAL LOW (ref 8.9–10.3)
Chloride: 106 mmol/L (ref 101–111)
Creatinine, Ser: 1.03 mg/dL — ABNORMAL HIGH (ref 0.44–1.00)
GFR calc Af Amer: >60 mL/min
GFR calc non Af Amer: 53 mL/min — ABNORMAL LOW
Glucose, Bld: 128 mg/dL — ABNORMAL HIGH (ref 65–99)
Potassium: 3.3 mmol/L — ABNORMAL LOW (ref 3.5–5.1)
Sodium: 142 mmol/L (ref 135–145)

## 2016-10-21 LAB — GLUCOSE, CAPILLARY
GLUCOSE-CAPILLARY: 123 mg/dL — AB (ref 65–99)
GLUCOSE-CAPILLARY: 92 mg/dL (ref 65–99)
GLUCOSE-CAPILLARY: 129 mg/dL — AB (ref 65–99)
GLUCOSE-CAPILLARY: 125 mg/dL — AB (ref 65–99)
GLUCOSE-CAPILLARY: 101 mg/dL — AB (ref 65–99)
GLUCOSE-CAPILLARY: 100 mg/dL — AB (ref 65–99)
GLUCOSE-CAPILLARY: 127 mg/dL — AB (ref 65–99)
Glucose-Capillary: 104 mg/dL — ABNORMAL HIGH (ref 65–99)
Glucose-Capillary: 122 mg/dL — ABNORMAL HIGH (ref 65–99)
Glucose-Capillary: 134 mg/dL — ABNORMAL HIGH (ref 65–99)
Glucose-Capillary: 142 mg/dL — ABNORMAL HIGH (ref 65–99)
Glucose-Capillary: 90 mg/dL (ref 65–99)
Glucose-Capillary: 96 mg/dL (ref 65–99)

## 2016-10-21 LAB — PREPARE PLATELET PHERESIS
UNIT DIVISION: 0
Unit division: 0
Unit division: 0

## 2016-10-21 LAB — CBC
HCT: 28.7 % — ABNORMAL LOW (ref 36.0–46.0)
HEMATOCRIT: 30.9 % — AB (ref 36.0–46.0)
Hemoglobin: 10.6 g/dL — ABNORMAL LOW (ref 12.0–15.0)
Hemoglobin: 9.8 g/dL — ABNORMAL LOW (ref 12.0–15.0)
MCH: 27.5 pg (ref 26.0–34.0)
MCH: 27.8 pg (ref 26.0–34.0)
MCHC: 34 g/dL (ref 30.0–36.0)
MCHC: 34.1 g/dL (ref 30.0–36.0)
MCV: 80.9 fL (ref 78.0–100.0)
MCV: 81.5 fL (ref 78.0–100.0)
Platelets: 188 10*3/uL (ref 150–400)
Platelets: 162 K/uL (ref 150–400)
RBC: 3.82 MIL/uL — ABNORMAL LOW (ref 3.87–5.11)
RBC: 3.52 MIL/uL — ABNORMAL LOW (ref 3.87–5.11)
RDW: 15.9 % — AB (ref 11.5–15.5)
RDW: 16.4 % — ABNORMAL HIGH (ref 11.5–15.5)
WBC: 11.1 10*3/uL — AB (ref 4.0–10.5)
WBC: 15 K/uL — ABNORMAL HIGH (ref 4.0–10.5)

## 2016-10-21 LAB — POCT I-STAT 4, (NA,K, GLUC, HGB,HCT)
Glucose, Bld: 122 mg/dL — ABNORMAL HIGH (ref 65–99)
HCT: 24 % — ABNORMAL LOW (ref 36.0–46.0)
Hemoglobin: 8.2 g/dL — ABNORMAL LOW (ref 12.0–15.0)
POTASSIUM: 3.7 mmol/L (ref 3.5–5.1)
SODIUM: 143 mmol/L (ref 135–145)

## 2016-10-21 LAB — CREATININE, SERUM
Creatinine, Ser: 1 mg/dL (ref 0.44–1.00)
GFR calc Af Amer: >60 mL/min (ref >60)
GFR, EST NON AFRICAN AMERICAN: 55 mL/min — AB (ref >60)

## 2016-10-21 LAB — MAGNESIUM
MAGNESIUM: 2.5 mg/dL — AB (ref 1.7–2.4)
Magnesium: 2.4 mg/dL (ref 1.7–2.4)

## 2016-10-21 MED ORDER — SODIUM CHLORIDE 0.9 % IV SOLN
30.0000 meq | Freq: Once | INTRAVENOUS | Status: AC
  Administered 2016-10-21: 30 meq via INTRAVENOUS
  Filled 2016-10-21: qty 15

## 2016-10-21 MED ORDER — INSULIN DETEMIR 100 UNIT/ML ~~LOC~~ SOLN
10.0000 [IU] | Freq: Once | SUBCUTANEOUS | Status: DC
  Filled 2016-10-21: qty 0.1

## 2016-10-21 MED ORDER — INSULIN ASPART 100 UNIT/ML ~~LOC~~ SOLN
0.0000 [IU] | SUBCUTANEOUS | Status: DC
  Administered 2016-10-21 – 2016-10-22 (×4): 2 [IU] via SUBCUTANEOUS

## 2016-10-21 MED ORDER — ENOXAPARIN SODIUM 40 MG/0.4ML ~~LOC~~ SOLN
40.0000 mg | Freq: Every day | SUBCUTANEOUS | Status: DC
  Administered 2016-10-21 – 2016-10-24 (×4): 40 mg via SUBCUTANEOUS
  Filled 2016-10-21 (×4): qty 0.4

## 2016-10-21 MED FILL — Potassium Chloride Inj 2 mEq/ML: INTRAVENOUS | Qty: 40 | Status: AC

## 2016-10-21 MED FILL — Heparin Sodium (Porcine) Inj 1000 Unit/ML: INTRAMUSCULAR | Qty: 10 | Status: AC

## 2016-10-21 MED FILL — Sodium Chloride IV Soln 0.9%: INTRAVENOUS | Qty: 2000 | Status: AC

## 2016-10-21 MED FILL — Mannitol IV Soln 20%: INTRAVENOUS | Qty: 500 | Status: AC

## 2016-10-21 MED FILL — Lidocaine HCl IV Inj 20 MG/ML: INTRAVENOUS | Qty: 10 | Status: AC

## 2016-10-21 MED FILL — Heparin Sodium (Porcine) Inj 1000 Unit/ML: INTRAMUSCULAR | Qty: 30 | Status: AC

## 2016-10-21 MED FILL — Magnesium Sulfate Inj 50%: INTRAMUSCULAR | Qty: 10 | Status: AC

## 2016-10-21 MED FILL — Sodium Bicarbonate IV Soln 8.4%: INTRAVENOUS | Qty: 100 | Status: AC

## 2016-10-21 MED FILL — Electrolyte-R (PH 7.4) Solution: INTRAVENOUS | Qty: 8000 | Status: AC

## 2016-10-21 MED FILL — Heparin Sodium (Porcine) Inj 1000 Unit/ML: INTRAMUSCULAR | Qty: 2500 | Status: AC

## 2016-10-21 NOTE — Care Management Note (Signed)
Case Management Note  Patient Details  Name: Nancy Montoya MRN: [id number] Date of Birth: Oct 21, 1945  Subjective/Objective:  Pt OOB to chair, husband, son, and daughter-in-law visiting.  Pt's husband and daughter-in-law will be available to assist pt as needed when medically ready for discharge, pt has cane, walker, and BSC.  CM answered husband's questions about intermittent skilled services if needed after d/c.                          Expected Discharge Plan:  Home/Self Care  Discharge planning Services  CM Consult  Status of Service:  In process, will continue to follow  Girard Cooter, RN 10/21/2016, 1:54 PM

## 2016-10-21 NOTE — Progress Notes (Signed)
[facility] BRIEF SICU PROGRESS NOTE  1 Day Post-Op  S/P Procedure(s) (LRB): BIOLOGIC BENTALL PROCEDURE -21 mm Medtronic Freestyle Porcine Valve Conduit (N/A) REPLACEMENT ASCENDING AORTA WITH ARCH RECONSTRUCTION-Aorto-Inominate bypass, Aorto-Carotid Bypass.. With Hypothermic circulatory arrest (N/A) TRANSESOPHAGEAL ECHOCARDIOGRAM (TEE) (N/A)   Stable day NSR w/ stable BP Breathing comfortably w/ O2 sats 96% on nasal cannula Chest tube output trending down UOP adequate Labs okay  Plan: Continue current plan  Rexene Alberts, MD 10/21/2016 4:59 PM

## 2016-10-21 NOTE — Progress Notes (Signed)
1 Day Post-Op Procedure(s) (LRB): BIOLOGIC BENTALL PROCEDURE -21 mm Medtronic Freestyle Porcine Valve Conduit (N/A) REPLACEMENT ASCENDING AORTA WITH ARCH RECONSTRUCTION-Aorto-Inominate bypass, Aorto-Carotid Bypass.. With Hypothermic circulatory arrest (N/A) TRANSESOPHAGEAL ECHOCARDIOGRAM (TEE) (N/A) Subjective: No complaints  Objective: Vital signs in last 24 hours: Temp:  [93 F (33.9 C)-99.5 F (37.5 C)] 98.4 F (36.9 C) (01/19 0500) Pulse Rate:  [80-96] 81 (01/19 0700) Resp:  [0-27] 18 (01/19 0700) BP: (90-139)/(20-100) 124/77 (01/19 0700) SpO2:  [97 %-100 %] 100 % (01/19 0700) Arterial Line BP: (82-256)/(51-250) 133/69 (01/19 0700) FiO2 (%):  [40 %-50 %] 40 % (01/18 2332) Weight:  [74.1 kg (163 lb 5.8 oz)] 74.1 kg (163 lb 5.8 oz) (01/19 0500)  Hemodynamic parameters for last 24 hours: PAP: (20-36)/(8-18) 20/10 CO:  [2.4 L/min-3.4 L/min] 3.4 L/min CI:  [1.4 L/min/m2-2 L/min/m2] 2 L/min/m2  Intake/Output from previous day: 01/18 0701 - 01/19 0700 In: 7467.2 [I.V.:4092.2; LU:[id number]; IV Piggyback:730] Out: 7010 [id number]; Blood:1000; Chest Tube:415] Intake/Output this shift: No intake/output data recorded.  General appearance: alert and cooperative Neurologic: intact Heart: regular rate and rhythm, S1, S2 normal, no murmur, click, rub or gallop Lungs: clear to auscultation bilaterally Extremities: edema moderate edema Wound: dressings dry  Lab Results:  Recent Labs  10/20/16 1916 10/21/16 0447  WBC 8.0 11.1*  HGB 8.1* 10.6*  HCT 23.7* 30.9*  PLT 171 188   BMET:  Recent Labs  10/18/16 1534  10/20/16 1509 10/21/16 0447  NA 138  < > 138 142  K 3.2*  < > 3.2* 3.3*  CL 103  < > 98* 106  CO2 24  --   --  26  GLUCOSE 93  < > 130* 128*  BUN 13  < > 13 13  CREATININE 1.08*  < > 0.70 1.03*  CALCIUM 10.1  --   --  8.1*  < > = values in this interval not displayed.  PT/INR:  Recent Labs  10/20/16 1916  LABPROT 11.3*  INR 0.82   ABG    Component Value  Date/Time   PHART 7.372 10/21/2016 0216   HCO3 25.1 10/21/2016 0216   TCO2 26 10/21/2016 0216   ACIDBASEDEF 1.0 10/20/2016 2356   O2SAT 98.0 10/21/2016 0216   CBG (last 3)   Recent Labs  10/21/16 0505 10/21/16 0617 10/21/16 0721  GLUCAP 125* 101* 122*   CXR: mild left base atelectasis  ECG: sinus RBBB (old), no acute changes   Assessment/Plan: S/P Procedure(s) (LRB): BIOLOGIC BENTALL PROCEDURE -21 mm Medtronic Freestyle Porcine Valve Conduit (N/A) REPLACEMENT ASCENDING AORTA WITH ARCH RECONSTRUCTION-Aorto-Inominate bypass, Aorto-Carotid Bypass.. With Hypothermic circulatory arrest (N/A) TRANSESOPHAGEAL ECHOCARDIOGRAM (TEE) (N/A)  She is hemodynamically stable in sinus rhythm.  DC arterial line and swan  Volume excess: diuresing well. Weight 11 lbs over preop. Replacing K+  Will keep chest tubes in this am and get out of bed. Probably remove later today.     LOS: 1 day    Nancy Montoya 10/21/2016

## 2016-10-21 NOTE — Plan of Care (Signed)
Problem: Respiratory: Goal: Ability to tolerate decreased levels of ventilator support will improve Outcome: Completed/Met Date Met: 10/21/16 Patient extubated.

## 2016-10-21 NOTE — Procedures (Signed)
Extubation Procedure Note  Patient Details:   Name: Nancy Montoya DOB: 02/22/1946 MRN: [id number]   Airway Documentation:  Airway 7.5 mm (Active)  Secured at (cm) 23 cm 10/20/2016 11:32 PM  Measured From Lips 10/20/2016 11:32 PM  Secured Location Right 10/20/2016 11:32 PM  Secured By Rana Snare Tape 10/20/2016 11:32 PM  Site Condition Dry 10/20/2016 11:32 PM    Evaluation  O2 sats: stable throughout Complications: No apparent complications Patient did tolerate procedure well. Bilateral Breath Sounds: Clear, Diminished   Yes   Patient achieved NIF -22 and FVC .500. RT extubated patient to 4L [place].  Patient able to verbalize name and achieved 547ml on IS.   Patsy Baltimore Bishoy Cupp 10/21/2016, 12:53 AM

## 2016-10-21 NOTE — Anesthesia Postprocedure Evaluation (Addendum)
Anesthesia Post Note  Patient: Nancy Montoya  Procedure(s) Performed: Procedure(s) (LRB): BIOLOGIC BENTALL PROCEDURE -21 mm Medtronic Freestyle Porcine Valve Conduit (N/A) REPLACEMENT ASCENDING AORTA WITH ARCH RECONSTRUCTION-Aorto-Inominate bypass, Aorto-Carotid Bypass.. With Hypothermic circulatory arrest (N/A) TRANSESOPHAGEAL ECHOCARDIOGRAM (TEE) (N/A)  Patient location during evaluation: SICU Anesthesia Type: General Level of consciousness: awake and sedated Pain management: pain level controlled Vital Signs Assessment: post-procedure vital signs reviewed and stable Respiratory status: spontaneous breathing and patient connected to face mask oxygen Cardiovascular status: blood pressure returned to baseline Anesthetic complications: no       Last Vitals:  Vitals:   10/21/16 0645 10/21/16 0700  BP:  124/77  Pulse: 82 81  Resp: 20 18  Temp:      Last Pain:  Vitals:   10/21/16 0439  TempSrc:   PainSc: 0-No pain                 [name],JAMES TERRILL

## 2016-10-22 ENCOUNTER — Inpatient Hospital Stay (HOSPITAL_COMMUNITY): Payer: PPO

## 2016-10-22 DIAGNOSIS — M7989 Other specified soft tissue disorders: Secondary | ICD-10-CM

## 2016-10-22 LAB — GLUCOSE, CAPILLARY
GLUCOSE-CAPILLARY: 113 mg/dL — AB (ref 65–99)
GLUCOSE-CAPILLARY: 116 mg/dL — AB (ref 65–99)
GLUCOSE-CAPILLARY: 122 mg/dL — AB (ref 65–99)
GLUCOSE-CAPILLARY: 127 mg/dL — AB (ref 65–99)
GLUCOSE-CAPILLARY: 153 mg/dL — AB (ref 65–99)
Glucose-Capillary: 124 mg/dL — ABNORMAL HIGH (ref 65–99)

## 2016-10-22 LAB — BASIC METABOLIC PANEL
Anion gap: 5 (ref 5–15)
BUN: 14 mg/dL (ref 6–20)
CALCIUM: 8.5 mg/dL — AB (ref 8.9–10.3)
CHLORIDE: 108 mmol/L (ref 101–111)
CO2: 28 mmol/L (ref 22–32)
CREATININE: 0.92 mg/dL (ref 0.44–1.00)
GFR calc Af Amer: >60 mL/min (ref >60)
GFR calc non Af Amer: >60 mL/min (ref >60)
GLUCOSE: 125 mg/dL — AB (ref 65–99)
Potassium: 4.1 mmol/L (ref 3.5–5.1)
Sodium: 141 mmol/L (ref 135–145)

## 2016-10-22 LAB — CBC
HCT: 29.7 % — ABNORMAL LOW (ref 36.0–46.0)
HEMOGLOBIN: 9.8 g/dL — AB (ref 12.0–15.0)
MCH: 27.5 pg (ref 26.0–34.0)
MCHC: 33 g/dL (ref 30.0–36.0)
MCV: 83.4 fL (ref 78.0–100.0)
Platelets: 163 10*3/uL (ref 150–400)
RBC: 3.56 MIL/uL — AB (ref 3.87–5.11)
RDW: 16.7 % — ABNORMAL HIGH (ref 11.5–15.5)
WBC: 16.4 10*3/uL — ABNORMAL HIGH (ref 4.0–10.5)

## 2016-10-22 MED ORDER — POTASSIUM CHLORIDE CRYS ER 20 MEQ PO TBCR
20.0000 meq | EXTENDED_RELEASE_TABLET | Freq: Every day | ORAL | Status: DC
  Administered 2016-10-23 – 2016-10-25 (×3): 20 meq via ORAL
  Filled 2016-10-22 (×3): qty 1

## 2016-10-22 MED ORDER — SODIUM CHLORIDE 0.9% FLUSH: 3.0000 mL | INTRAVENOUS | Status: DC | PRN

## 2016-10-22 MED ORDER — SODIUM CHLORIDE 0.9 % IV SOLN
250.0000 mL | INTRAVENOUS | Status: DC | PRN
  Administered 2016-10-22: 250 mL via INTRAVENOUS

## 2016-10-22 MED ORDER — SODIUM CHLORIDE 0.9% FLUSH
3.0000 mL | Freq: Two times a day (BID) | INTRAVENOUS | Status: DC
  Administered 2016-10-22 – 2016-10-24 (×5): 3 mL via INTRAVENOUS

## 2016-10-22 MED ORDER — FUROSEMIDE 40 MG PO TABS
40.0000 mg | ORAL_TABLET | Freq: Every day | ORAL | Status: DC
  Administered 2016-10-22 – 2016-10-25 (×4): 40 mg via ORAL
  Filled 2016-10-22 (×4): qty 1

## 2016-10-22 MED ORDER — DEXTROSE 5 % IV SOLN
1.5000 g | Freq: Once | INTRAVENOUS | Status: AC
  Administered 2016-10-22: 1.5 g via INTRAVENOUS
  Filled 2016-10-22: qty 1.5

## 2016-10-22 MED ORDER — MOVING RIGHT ALONG BOOK
Freq: Once | Status: AC
  Administered 2016-10-22: 10:00:00
  Filled 2016-10-22: qty 1

## 2016-10-22 NOTE — Progress Notes (Signed)
Patient arrived from [place] and placed on monitor and vital signs obtained. Patient has call bell and phone at side. Will monitor patient. Ailynn Gow, Bettina Gavia RN

## 2016-10-22 NOTE — Progress Notes (Signed)
*  PRELIMINARY RESULTS* Vascular Ultrasound Left upper extremity venous duplex has been completed.  Preliminary findings: the left upper extremity appears negative for deep and superficial vein thrombosis at this time.  Some sluggish flow seen but all visualized vessels are currently patent.  Everrett Coombe 10/22/2016, 11:52 AM

## 2016-10-22 NOTE — Progress Notes (Signed)
[address]       [place] [address]             [phone]        CARDIOTHORACIC SURGERY PROGRESS NOTE   R2 Days Post-Op Procedure(s) (LRB): BIOLOGIC BENTALL PROCEDURE -21 mm Medtronic Freestyle Porcine Valve Conduit (N/A) REPLACEMENT ASCENDING AORTA WITH ARCH RECONSTRUCTION-Aorto-Inominate bypass, Aorto-Carotid Bypass.. With Hypothermic circulatory arrest (N/A) TRANSESOPHAGEAL ECHOCARDIOGRAM (TEE) (N/A)  Subjective: No complaints.  Mild soreness in chest.  Mild swelling left arm.  Objective: Vital signs: BP Readings from Last 1 Encounters:  10/22/16 (!) 95/54   Pulse Readings from Last 1 Encounters:  10/22/16 96   Resp Readings from Last 1 Encounters:  10/22/16 (!) 27   Temp Readings from Last 1 Encounters:  10/22/16 98 F (36.7 C) (Oral)    Hemodynamics:    Physical Exam:  Rhythm:   sinus  Breath sounds: clear  Heart sounds:  RRR  Incisions:  Dressing dry, intact  Abdomen:  Soft, non-distended, non-tender  Extremities:  Warm, well-perfused.  Mild swelling left arm with palpable distal pulses  Intake/Output from previous day: 01/19 0701 - 01/20 0700 In: 1557.8 [P.O.:780; I.V.:147.8; IV Piggyback:630] Out: 1620 [Urine:1470; Chest Tube:150] Intake/Output this shift: No intake/output data recorded.  Lab Results:  CBC: Recent Labs  10/21/16 1548 10/21/16 1558 10/22/16 0400  WBC 15.0*  --  16.4*  HGB 9.8* 9.9* 9.8*  HCT 28.7* 29.0* 29.7*  PLT 162  --  163    BMET:  Recent Labs  10/21/16 0447  10/21/16 1558 10/22/16 0400  NA 142  --  143 141  K 3.3*  --  3.8 4.1  CL 106  --  105 108  CO2 26  --   --  28  GLUCOSE 128*  --  141* 125*  BUN 13  --  14 14  CREATININE 1.03*  < > 1.10* 0.92  CALCIUM 8.1*  --   --  8.5*  < > = values in this interval not displayed.   PT/INR:   Recent Labs  10/20/16 1916  LABPROT 11.3*  INR 0.82    CBG (last 3)   Recent Labs  10/22/16 0043 10/22/16 0404 10/22/16 0727  GLUCAP  116* 122* 113*    ABG    Component Value Date/Time   PHART 7.372 10/21/2016 0216   PCO2ART 43.2 10/21/2016 0216   PO2ART 102.0 10/21/2016 0216   HCO3 25.1 10/21/2016 0216   TCO2 29 10/21/2016 1558   ACIDBASEDEF 1.0 10/20/2016 2356   O2SAT 98.0 10/21/2016 0216    CXR: PORTABLE CHEST 1 VIEW  COMPARISON:  October 21, 2016  FINDINGS: The PA catheter has been removed with a right IJ sheath remaining. The right chest tube and mediastinal drain have also been removed. Increased opacity in the right base is probably atelectasis. The left chest tube has also been removed. Persistent opacity and tiny effusion on the left remains. No overt edema. No change in the cardiomediastinal silhouette.  IMPRESSION: 1. Most of the support apparatus has been removed as above with no pneumothorax. 2. Increasing opacity in the right base favored to represent atelectasis. Stable opacity in the left retrocardiac region. Small bilateral effusions. 3. Mild pulmonary venous congestion not excluded but there is no overt edema.   Electronically Signed   By: Dorise Bullion III M.D   On: 10/22/2016 08:08    Assessment/Plan: S/P Procedure(s) (LRB): BIOLOGIC BENTALL PROCEDURE -21 mm Medtronic Freestyle Porcine Valve Conduit (  N/A) REPLACEMENT ASCENDING AORTA WITH ARCH RECONSTRUCTION-Aorto-Inominate bypass, Aorto-Carotid Bypass.. With Hypothermic circulatory arrest (N/A) TRANSESOPHAGEAL ECHOCARDIOGRAM (TEE) (N/A)  Doing very well POD2 Maintaining NSR w/ stable BP Breathing comfortably w/ O2 sats 97% on 1 L/min Expected post op acute blood loss anemia, Hgb stable Expected post op volume excess, mild, diuresing   Mobilize  Diuresis  Transfer 2W   Rexene Alberts, MD 10/22/2016 10:18 AM

## 2016-10-23 ENCOUNTER — Inpatient Hospital Stay (HOSPITAL_COMMUNITY): Payer: PPO

## 2016-10-23 LAB — CBC
HCT: 28.1 % — ABNORMAL LOW (ref 36.0–46.0)
Hemoglobin: 9.2 g/dL — ABNORMAL LOW (ref 12.0–15.0)
MCH: 27.5 pg (ref 26.0–34.0)
MCHC: 32.7 g/dL (ref 30.0–36.0)
MCV: 84.1 fL (ref 78.0–100.0)
PLATELETS: 147 10*3/uL — AB (ref 150–400)
RBC: 3.34 MIL/uL — AB (ref 3.87–5.11)
RDW: 16.3 % — AB (ref 11.5–15.5)
WBC: 15.4 10*3/uL — AB (ref 4.0–10.5)

## 2016-10-23 LAB — BASIC METABOLIC PANEL
Anion gap: 5 (ref 5–15)
BUN: 14 mg/dL (ref 6–20)
CO2: 29 mmol/L (ref 22–32)
Calcium: 8.5 mg/dL — ABNORMAL LOW (ref 8.9–10.3)
Chloride: 105 mmol/L (ref 101–111)
Creatinine, Ser: 0.95 mg/dL (ref 0.44–1.00)
GFR calc Af Amer: >60 mL/min (ref >60)
GFR calc non Af Amer: 58 mL/min — ABNORMAL LOW (ref >60)
Glucose, Bld: 115 mg/dL — ABNORMAL HIGH (ref 65–99)
Potassium: 4 mmol/L (ref 3.5–5.1)
Sodium: 139 mmol/L (ref 135–145)

## 2016-10-23 LAB — GLUCOSE, CAPILLARY
GLUCOSE-CAPILLARY: 112 mg/dL — AB (ref 65–99)
Glucose-Capillary: 100 mg/dL — ABNORMAL HIGH (ref 65–99)
Glucose-Capillary: 103 mg/dL — ABNORMAL HIGH (ref 65–99)

## 2016-10-23 MED ORDER — FERROUS SULFATE 325 (65 FE) MG PO TABS
325.0000 mg | ORAL_TABLET | Freq: Every day | ORAL | Status: DC
  Administered 2016-10-24 – 2016-10-25 (×2): 325 mg via ORAL
  Filled 2016-10-23 (×2): qty 1

## 2016-10-23 MED ORDER — LACTULOSE 10 GM/15ML PO SOLN
20.0000 g | Freq: Once | ORAL | Status: AC
  Administered 2016-10-23: 20 g via ORAL
  Filled 2016-10-23: qty 30

## 2016-10-23 NOTE — Progress Notes (Addendum)
      [address]       [place] [address]             [phone]        3 Days Post-Op Procedure(s) (LRB): BIOLOGIC BENTALL PROCEDURE -21 mm Medtronic Freestyle Porcine Valve Conduit (N/A) REPLACEMENT ASCENDING AORTA WITH ARCH RECONSTRUCTION-Aorto-Inominate bypass, Aorto-Carotid Bypass.. With Hypothermic circulatory arrest (N/A) TRANSESOPHAGEAL ECHOCARDIOGRAM (TEE) (N/A)  Subjective: Patient with incisional, sternal pain. She does not have much appetite and has not had a bowel movement yet.  Objective: Vital signs in last 24 hours: Temp:  [98 F (36.7 C)-99.1 F (37.3 C)] 99.1 F (37.3 C) (01/21 0300) Pulse Rate:  [88-103] 95 (01/21 0300) Cardiac Rhythm: Normal sinus rhythm;Bundle branch block (01/20 1900) Resp:  [13-27] 18 (01/21 0300) BP: (94-123)/(54-63) 115/61 (01/21 0300) SpO2:  [96 %-99 %] 97 % (01/21 0300) Weight:  [160 lb 1.6 oz (72.6 kg)] 160 lb 1.6 oz (72.6 kg) (01/21 0300)  Pre op weight 68.9 kg Current Weight  10/23/16 160 lb 1.6 oz (72.6 kg)      Intake/Output from previous day: 01/20 0701 - 01/21 0700 In: 240 [P.O.:240] Out: 700 [Urine:700]   Physical Exam:  Cardiovascular: RRR Pulmonary: Slightly diminished at bases Abdomen: Soft, non tender, bowel sounds present. Extremities: Mild bilateral lower extremity edema. Wounds: Clean and dry.  No erythema or signs of infection.  Lab Results: CBC: Recent Labs  10/22/16 0400 10/23/16 0228  WBC 16.4* 15.4*  HGB 9.8* 9.2*  HCT 29.7* 28.1*  PLT 163 147*   BMET:  Recent Labs  10/22/16 0400 10/23/16 0228  NA 141 139  K 4.1 4.0  CL 108 105  CO2 28 29  GLUCOSE 125* 115*  BUN 14 14  CREATININE 0.92 0.95  CALCIUM 8.5* 8.5*    PT/INR:  Lab Results  Component Value Date   INR 0.82 10/20/2016   INR 1.38 10/20/2016   INR 1.00 10/18/2016   ABG:  INR: Will add last result for INR, ABG once components are confirmed Will add last 4 CBG results once components are  confirmed  Assessment/Plan:  1. CV - SR in the 90's. On Lopressor 12.5 mg bid 2.  Pulmonary - On 2 liters of oxygen via [place]. Wean to room air as tolerates.  CXR this am appears to show bilateral pleural effusions R>L, atelectasis at bases. Monitor right pleural effusion to make sure does not need thoracentesis. Encourage incentive spirometer. 3. Volume Overload - On Lasix 40 mg daily 4.  Acute blood loss anemia - H and H slightly decreased to 9.2 and 28.1. 5. Remove EPW in am 6. LOC later today 7. Likely home early this week  ZIMMERMAN,DONIELLE MPA-C 10/23/2016,7:58 AM   I have seen and examined the patient and agree with the assessment and plan as outlined.  Overall doing very well.  CXR looks okay.  Rexene Alberts, MD 10/23/2016 11:02 AM

## 2016-10-23 NOTE — Progress Notes (Signed)
Pt assist time one tochair call bell within reach will monitor patient. Darryle Dennie, Bettina Gavia RN

## 2016-10-23 NOTE — Progress Notes (Signed)
Patient up to chair, patient resting/ asleep will monitor patient. Nancy Montoya, Bettina Gavia RN

## 2016-10-24 ENCOUNTER — Inpatient Hospital Stay (HOSPITAL_COMMUNITY): Payer: PPO

## 2016-10-24 LAB — TYPE AND SCREEN
ABO/RH(D): O POS
ANTIBODY SCREEN: NEGATIVE
UNIT DIVISION: 0
UNIT DIVISION: 0
UNIT DIVISION: 0
Unit division: 0
Unit division: 0
Unit division: 0
Unit division: 0

## 2016-10-24 LAB — CBC
HEMATOCRIT: 28 % — AB (ref 36.0–46.0)
Hemoglobin: 8.9 g/dL — ABNORMAL LOW (ref 12.0–15.0)
MCH: 27.2 pg (ref 26.0–34.0)
MCHC: 31.8 g/dL (ref 30.0–36.0)
MCV: 85.6 fL (ref 78.0–100.0)
Platelets: 169 10*3/uL (ref 150–400)
RBC: 3.27 MIL/uL — ABNORMAL LOW (ref 3.87–5.11)
RDW: 16.3 % — AB (ref 11.5–15.5)
WBC: 13.2 10*3/uL — ABNORMAL HIGH (ref 4.0–10.5)

## 2016-10-24 LAB — GLUCOSE, CAPILLARY
Glucose-Capillary: 90 mg/dL (ref 65–99)
Glucose-Capillary: 128 mg/dL — ABNORMAL HIGH (ref 65–99)

## 2016-10-24 MED ORDER — METOPROLOL TARTRATE 25 MG PO TABS
25.0000 mg | ORAL_TABLET | Freq: Two times a day (BID) | ORAL | Status: DC
  Administered 2016-10-24 – 2016-10-25 (×3): 25 mg via ORAL
  Filled 2016-10-24 (×3): qty 1

## 2016-10-24 NOTE — Discharge Summary (Signed)
Physician Discharge Summary  Patient ID: Nancy Montoya MRN: [id number] DOB/AGE: June 20, 1946 71 y.o.  Admit date: 10/20/2016 Discharge date: 10/25/2016  Admission Diagnoses:  Patient Active Problem List   Diagnosis Date Noted  . Thoracic aortic aneurysm without rupture ([place])   . EPSTEIN-BARR VIRUS 09/21/2007  . FATIGUE 09/21/2007  . LIVER FUNCTION TESTS, ABNORMAL 09/21/2007  . HYPERTENSION, HX OF 09/21/2007  . LIPOMA 09/05/2007  . CARDIOMEGALY, MILD 08/10/2007   Discharge Diagnoses:   Patient Active Problem List   Diagnosis Date Noted  . S/P aortic arch reconstruction 10/20/2016  . Thoracic aortic aneurysm without rupture ([place])   . EPSTEIN-BARR VIRUS 09/21/2007  . FATIGUE 09/21/2007  . LIVER FUNCTION TESTS, ABNORMAL 09/21/2007  . HYPERTENSION, HX OF 09/21/2007  . LIPOMA 09/05/2007  . CARDIOMEGALY, MILD 08/10/2007   Discharged Condition: good  History of Present Illness:  Nancy Montoya is a 71 yo African American female with known history of HTN and a right sided neck lipoma.  She recently was at an appointment with her PCP who felt her neck lipoma had increased in size.  She got an ultrasound that measured the lipoma to be 6.4 cm and further workup with CT scan showed the lipoma was actually 7.2 cm in size.  She was also found to have a 6.1 cm distal ascending aortic aneurysm that extended into the arch and proximal descending aorta where it measured 4.5 cm.  The patient is mostly symptomatic but does have some mild shortness of breath with activity.  She had a CTA of her chest tube which showed dilation of her entire thoracic aortic with the ascending aorta measuring 6.2 cm.  Due to this it was felt surgical intervention should be performed.  She was evaluated by Dr. Cyndia Bent at [facility] who felt she would require a Bentall procedure with arch reconstruction.  The risks and benefits of the procedure were explained to the patient and she was agreeable to proceed.  Hospital Course:   Mrs.  Montoya presented to [facility] on 10/20/2016.  She was taken to the operating room and underwent Median Sternotomy, Bentall procedure, and aortic arch reconstruction under hypothermic circulatory arrest.  She tolerated the procedure without difficulty and was taken to the SICU in stable condition.  During her stay in the SICU the patient was weaned and extubated without difficulty.  Her chest tubes and arterial lines were removed without difficulty.  She was hypervolemic and she responded well to diuretics.  She was mildly hypokalemic and was supplemented accordingly.  She developed mild upper extremity swelling on her right side.  She was axillary cannulated for her procedure and therefore duplex study was ordered.  This was negative for DVT. She was maintaining NSR and felt medically stable for transfer to the telemetry unit on POD #2.  The patient continues to make progress.  She continues to maintain NSR and her pacing wires have been removed without difficulty.  Her blood pressure is well controlled on low dose Beta blocker therapy.  She continues to ambulate independently.  She has been weaned off oxygen.  She is medically stable for discharge home today.        Significant Diagnostic Studies: radiology:   CT scan:   . Interval aneurysmal dilatation of the entirety of the thoracic aorta with the ascending thoracic aorta measuring 62 mm in diameter, previously, 38 mm. No evidence of thoracic aortic dissection or periaortic stranding. 2. The thoracic aortic aneurysm extends to involve the suprarenal abdominal aorta, measuring  approximately 3.3 cm between the takeoff of the SMA and celiac arteries. The abdominal aorta tapers to a normal caliber below the level of the renal arteries. 3. Coronary artery calcifications. Aortic Atherosclerosis (ICD10-170.0) Nonvascular Impression:  1. No acute findings within the chest, abdomen or pelvis. 2. Multiple bilateral renal cysts. 3. Colonic  diverticulosis without evidence of diverticulitis. 4. Additional ancillary findings as above.  Treatments: surgery:   1. Median Sternotomy 2. Right axillary artery cannulation using an 8 mm Hemashield graft. 3.   Extracorporeal circulation via the right axillary artery and right atrium 4.   Replacement of ascending aorta and aortic arch with aorto-innominate and aorto-left carotid bypass using a 28 x 10 x 8 x 8 x 10 mm Hemashield Platinum graft.  5.  Bentall procedure using a 21 mm Medtronic Freestyle Porcine Root. Reimplantation of left and right coronary arteries.    Disposition: 01-Home or Self Care   Discharge Medications:  The patient has been discharged on:   1.Beta Blocker:  Yes [x   ]                              No   [   ]                              If No, reason:  2.Ace Inhibitor/ARB: Yes [   ]                                     No  [  x  ]                                     If No, reason: labile BP  3.Statin:   Yes [   ]                  No  [ x  ]                  If No, reason: No CAD  4.[name]:  Yes  [ x  ]                  No   [   ]                  If No, reason:      Allergies as of 10/25/2016      Reactions   Codeine    hallucinations   Latex Rash      Medication List    STOP taking these medications   hydrochlorothiazide 25 MG tablet Commonly known as:  HYDRODIURIL   ibuprofen 800 MG tablet Commonly known as:  ADVIL,MOTRIN     TAKE these medications   aspirin 325 MG EC tablet Take 1 tablet (325 mg total) by mouth daily.   ferrous sulfate 325 (65 FE) MG tablet Take 1 tablet (325 mg total) by mouth daily with breakfast. Start taking on:  10/26/2016   furosemide 40 MG tablet Commonly known as:  LASIX Take 1 tablet (40 mg total) by mouth daily.   metoprolol tartrate 25 MG tablet Commonly known as:  LOPRESSOR Take 1 tablet (25 mg total) by mouth 2 (two) times daily.  oxyCODONE 5 MG immediate release tablet Commonly known as:   Oxy IR/ROXICODONE Take 1-2 tablets (5-10 mg total) by mouth every 6 (six) hours as needed for severe pain.   potassium chloride SA 20 MEQ tablet Commonly known as:  K-DUR,KLOR-CON Take 1 tablet (20 mEq total) by mouth daily.      Follow-up Information    Gaye Pollack, MD Follow up.   Specialty:  Cardiothoracic Surgery Why:  see discharge instructions for follow- up appointments. At time of appointment to see Dr. Cyndia Bent please also obtain a chest x-ray add [facility] imaging one half hour prior to the appointment. [facility] is located in the same office complex. Contact information: [address] [place] [address] [phone]        Lauree Chandler, MD Follow up.   Specialty:  Cardiology Why:  see discharge paperwork for cardiology follow-up appt Contact information: [place]. [address] [place] [address] [phone]           Signed: John Giovanni 10/25/2016, 8:52 AM

## 2016-10-24 NOTE — Progress Notes (Signed)
CARDIAC REHAB PHASE I   PRE:  Rate/Rhythm: 108 ST  BP:  Supine:   Sitting: 130/75  Standing:    SaO2: 95% 1L, 92%RA  MODE:  Ambulation: 400 ft   POST:  Rate/Rhythm: 122 ST  BP:  Supine:   Sitting: 137/70  Standing:    SaO2: 89-91%RA 0855-0930 Pt walked 400 ft on RA with rolling walker and minimal asst. Tolerated well. To bed after walk for pacing wire removal.  Encouraged IS and two more walks today. Left on RA. Told pt to call RN if she feels SOB.   Graylon Good, RN BSN  10/24/2016 9:25 AM

## 2016-10-24 NOTE — Progress Notes (Addendum)
      [address]       [place] [address]             [phone]      4 Days Post-Op Procedure(s) (LRB): BIOLOGIC BENTALL PROCEDURE -21 mm Medtronic Freestyle Porcine Valve Conduit (N/A) REPLACEMENT ASCENDING AORTA WITH ARCH RECONSTRUCTION-Aorto-Inominate bypass, Aorto-Carotid Bypass.. With Hypothermic circulatory arrest (N/A) TRANSESOPHAGEAL ECHOCARDIOGRAM (TEE) (N/A)   Subjective:  No complaints.  Feels pretty good.  + ambulation  + BM  Objective: Vital signs in last 24 hours: Temp:  [98.5 F (36.9 C)-99 F (37.2 C)] 98.7 F (37.1 C) (01/22 0520) Pulse Rate:  [97-108] 97 (01/22 0520) Cardiac Rhythm: Sinus tachycardia;Bundle branch block (01/21 1900) Resp:  [17-18] 17 (01/22 0520) BP: (108-128)/(62-70) 128/70 (01/22 0520) SpO2:  [86 %-94 %] 94 % (01/22 0520) Weight:  [156 lb 6.4 oz (70.9 kg)] 156 lb 6.4 oz (70.9 kg) (01/22 0520)  Intake/Output from previous day: 01/21 0701 - 01/22 0700 In: 720 [P.O.:720] Out: 350 [Urine:350]  General appearance: alert, cooperative and no distress Heart: regular rate and rhythm Lungs: clear to auscultation bilaterally Abdomen: soft, non-tender; bowel sounds normal; no masses,  no organomegaly Extremities: edema trace Wound: clean and dry  Lab Results:  Recent Labs  10/23/16 0228 10/24/16 0231  WBC 15.4* 13.2*  HGB 9.2* 8.9*  HCT 28.1* 28.0*  PLT 147* 169   BMET:  Recent Labs  10/22/16 0400 10/23/16 0228  NA 141 139  K 4.1 4.0  CL 108 105  CO2 28 29  GLUCOSE 125* 115*  BUN 14 14  CREATININE 0.92 0.95  CALCIUM 8.5* 8.5*    PT/INR: No results for input(s): LABPROT, INR in the last 72 hours. ABG    Component Value Date/Time   PHART 7.372 10/21/2016 0216   HCO3 25.1 10/21/2016 0216   TCO2 29 10/21/2016 1558   ACIDBASEDEF 1.0 10/20/2016 2356   O2SAT 98.0 10/21/2016 0216   CBG (last 3)   Recent Labs  10/23/16 1109 10/23/16 2140 10/24/16 0635  GLUCAP 100* 103* 90     Assessment/Plan: S/P Procedure(s) (LRB): BIOLOGIC BENTALL PROCEDURE -21 mm Medtronic Freestyle Porcine Valve Conduit (N/A) REPLACEMENT ASCENDING AORTA WITH ARCH RECONSTRUCTION-Aorto-Inominate bypass, Aorto-Carotid Bypass.. With Hypothermic circulatory arrest (N/A) TRANSESOPHAGEAL ECHOCARDIOGRAM (TEE) (N/A)  1. CV- Sinus Tach, will increase Lopressor to 25 mg BID 2. Pulm- no acute issues, wean oxygen as tolerated, continue IS 3. Renal- creatinine has been WNL, mild hypervolemia- continue Lasix 4. Expected post operative blood loss anemia- mild Hgb 8.9, will encourage multivitamin with iron 5. Dispo- patient stable, will increase BB for Tachycardia, d/c EPW, possibly home in next 24-48 hours if remains medically stable   LOS: 4 days    Ellwood Handler 10/24/2016   Chart reviewed, patient examined, agree with above. She feels well. Mild resting tachycardia and Lopressor increased to 25 bid today. This may be related to anemia and should improve with time. I think she can go home tomorrow is no significant changes.

## 2016-10-24 NOTE — Progress Notes (Signed)
Patient ambulated in hallway with nursing staff. Back in room will monitor patient . Burel Kahre Jessup RN  

## 2016-10-24 NOTE — Care Management Important Message (Signed)
Important Message  Patient Details  Name: [name] MRN: [id number] Date of Birth: 04/09/1946   Medicare Important Message Given:  Yes    Nathen May 10/24/2016, 1:35 PM

## 2016-10-24 NOTE — Discharge Instructions (Signed)
Aortic Valve Replacement, Care After °Refer to this sheet in the next few weeks. These instructions provide you with information about caring for yourself after your procedure. Your health care provider may also give you more specific instructions. Your treatment has been planned according to current medical practices, but problems sometimes occur. Call your health care provider if you have any problems or questions after your procedure. °What can I expect after the procedure? °After the procedure, it is common to have: °· Pain around your incision area. °· A small amount of blood or clear fluid coming from your incision. ° °Follow these instructions at home: °Eating and drinking ° °· Follow instructions from your health care provider about eating or drinking restrictions. °? Limit alcohol intake to no more than 1 drink per day for nonpregnant women and 2 drinks per day for men. One drink equals 12 oz of beer, 5 oz of wine, or 1½ oz of hard liquor. °? Limit how much caffeine you drink. Caffeine can affect your heart's rate and rhythm. °· Drink enough fluid to keep your urine clear or pale yellow. °· Eat a heart-healthy diet. This should include plenty of fresh fruits and vegetables. If you eat meat, it should be lean cuts. Avoid foods that are: °? High in salt, saturated fat, or sugar. °? Canned or highly processed. °? Fried. °Activity °· Return to your normal activities as told by your health care provider. Ask your health care provider what activities are safe for you. °· Exercise regularly once you have recovered, as told by your health care provider. °· Avoid sitting for more than 2 hours at a time without moving. Get up and move around at least once every 1-2 hours. This helps to prevent blood clots in the legs. °· Do not lift anything that is heavier than 10 lb (4.5 kg) until your health care provider approves. °· Avoid pushing or pulling things with your arms until your health care provider approves. This  includes pulling on handrails to help you climb stairs. °Incision care ° °· Follow instructions from your health care provider about how to take care of your incision. Make sure you: °? Wash your hands with soap and water before you change your bandage (dressing). If soap and water are not available, use hand sanitizer. °? Change your dressing as told by your health care provider. °? Leave stitches (sutures), skin glue, or adhesive strips in place. These skin closures may need to stay in place for 2 weeks or longer. If adhesive strip edges start to loosen and curl up, you may trim the loose edges. Do not remove adhesive strips completely unless your health care provider tells you to do that. °· Check your incision area every day for signs of infection. Check for: °? More redness, swelling, or pain. °? More fluid or blood. °? Warmth. °? Pus or a bad smell. °Medicines °· Take over-the-counter and prescription medicines only as told by your health care provider. °· If you were prescribed an antibiotic medicine, take it as told by your health care provider. Do not stop taking the antibiotic even if you start to feel better. °Travel °· Avoid airplane travel for as long as told by your health care provider. °· When you travel, bring a list of your medicines and a record of your medical history with you. Carry your medicines with you. °Driving °· Ask your health care provider when it is safe for you to drive. Do not drive until your health   care provider approves. °· Do not drive or operate heavy machinery while taking prescription pain medicine. °Lifestyle ° °· Do not use any tobacco products, such as cigarettes, chewing tobacco, or e-cigarettes. If you need help quitting, ask your health care provider. °· Resume sexual activity as told by your health care provider. Do not use medicines for erectile dysfunction unless your health care provider approves, if this applies. °· Work with your health care provider to keep your  blood pressure and cholesterol under control, and to manage any other heart conditions that you have. °· Maintain a healthy weight. °General instructions °· Do not take baths, swim, or use a hot tub until your health care provider approves. °· Do not strain to have a bowel movement. °· Avoid crossing your legs while sitting down. °· Check your temperature every day for a fever. A fever may be a sign of infection. °· If you are a woman and you plan to become pregnant, talk with your health care provider before you become pregnant. °· Wear compression stockings if your health care provider instructs you to do this. These stockings help to prevent blood clots and reduce swelling in your legs. °· Tell all health care providers who care for you that you have an artificial (prosthetic) aortic valve. If you have or have had heart disease or endocarditis, tell all health care providers about these conditions as well. °· Keep all follow-up visits as told by your health care provider. This is important. °Contact a health care provider if: °· You develop a skin rash. °· You experience sudden, unexplained changes in your weight. °· You have more redness, swelling, or pain around your incision. °· You have more fluid or blood coming from your incision. °· Your incision feels warm to the touch. °· You have pus or a bad smell coming from your incision. °· You have a fever. °Get help right away if: °· You develop chest pain that is different from the pain coming from your incision. °· You develop shortness of breath or difficulty breathing. °· You start to feel light-headed. °These symptoms may represent a serious problem that is an emergency. Do not wait to see if the symptoms will go away. Get medical help right away. Call your local emergency services (911 in the [place] Do not drive yourself to the hospital. °This information is not intended to replace advice given to you by your health care provider. Make sure you discuss any  questions you have with your health care provider. °Document Released: 04/07/2005 Document Revised: 02/25/2016 Document Reviewed: 08/23/2015 °Elsevier Interactive Patient Education © 2017 [employer] ° °

## 2016-10-24 NOTE — Progress Notes (Signed)
Patient EPW removed as ordered and per protocol. All ends intact. Patient reminded to lie supine approximately one hour. Will monitor patient. Pt ST 113 on monitor bp 120/67. Christyan Reger, Bettina Gavia RN

## 2016-10-25 MED ORDER — FERROUS SULFATE 325 (65 FE) MG PO TABS: 325.0000 mg | ORAL_TABLET | Freq: Every day | ORAL | 3 refills | Status: DC

## 2016-10-25 MED ORDER — METOPROLOL TARTRATE 25 MG PO TABS: 25.0000 mg | ORAL_TABLET | Freq: Two times a day (BID) | ORAL | 1 refills | Status: DC

## 2016-10-25 MED ORDER — ASPIRIN 325 MG PO TBEC: 325.0000 mg | DELAYED_RELEASE_TABLET | Freq: Every day | ORAL | 0 refills | Status: DC

## 2016-10-25 MED ORDER — FUROSEMIDE 40 MG PO TABS: 40.0000 mg | ORAL_TABLET | Freq: Every day | ORAL | 0 refills | Status: DC

## 2016-10-25 MED ORDER — OXYCODONE HCL 5 MG PO TABS: 5.0000 mg | ORAL_TABLET | Freq: Four times a day (QID) | ORAL | 0 refills | Status: DC | PRN

## 2016-10-25 MED ORDER — POTASSIUM CHLORIDE CRYS ER 20 MEQ PO TBCR: 20.0000 meq | EXTENDED_RELEASE_TABLET | Freq: Every day | ORAL | 0 refills | Status: DC

## 2016-10-25 NOTE — Consult Note (Signed)
            [facility] CM Primary Care Navigator  10/25/2016  SHEMICA HUSS 16-Aug-1946 [id number]   Went to see patient at the bedside to identify possible discharge needs but she was already discharged to home today per staff.  Primary care provider's office called [facility]) to notify of patient's discharge and need for post hospital follow-up and transition of care. Made aware to refer patient to [facility] care management for care coordination needs if deemed appropriate for services.  For additional questions please contact:  Edwena Felty A. Luda Charbonneau, BSN, RN-BC [facility] PRIMARY CARE Navigator Cell: [phone]

## 2016-10-25 NOTE — Care Management Note (Signed)
Case Management Note Previous CM note initiated by Girard Cooter, RN 10/21/2016, 1:54 PM  Patient Details  Name: Nancy Montoya MRN: [id number] Date of Birth: 06/20/1946  Subjective/Objective:  Pt OOB to chair, husband, son, and daughter-in-law visiting.  Pt's husband and daughter-in-law will be available to assist pt as needed when medically ready for discharge, pt has cane, walker, and BSC.  CM answered husband's questions about intermittent skilled services if needed after d/c.                                   Action/Plan: Marvetta Gibbons RN, CM- 10/24/16- Pt s/p Bentall - tx from ICU to 2W on 10/22/16- pt from home and plan to return home with family assistance on discharge.   Expected Discharge Date:  10/25/16               Expected Discharge Plan:  Home/Self Care  In-House Referral:     Discharge planning Services  CM Consult  Post Acute Care Choice:  NA Choice offered to:  NA  DME Arranged:    DME Agency:     HH Arranged:    HH Agency:     Status of Service:  Completed, signed off  If discussed at [place] of Stay Meetings, dates discussed:  1/23  Discharge Disposition: home/self care   Additional Comments:  10/25/16- 1030- Yamaris Cummings RN, CM- pt for d/c home today- No further CM needs noted.   Dahlia Client [place], RN 10/25/2016, 10:22 AM [phone]

## 2016-10-25 NOTE — Progress Notes (Signed)
10/25/2016 11:18 AM Discharge AVS meds taken today and those due this evening reviewed.  Follow-up appointments and when to call md reviewed.  D/C IV and TELE.  Questions and concerns addressed.   D/C home per orders. [place]

## 2016-10-25 NOTE — Progress Notes (Signed)
Ed completed with pt. Voiced understanding. Requests her referral be sent to [facility]. I set up d/c video for her. [id number] [place], ACSM 10:13 AM 10/25/2016

## 2016-10-25 NOTE — Progress Notes (Signed)
      [address]       [place] [address]             [phone]     CARDIOTHORACIC SURGERY PROGRESS NOTE  5 Days Post-Op  S/P Procedure(s) (LRB): BIOLOGIC BENTALL PROCEDURE -21 mm Medtronic Freestyle Porcine Valve Conduit (N/A) REPLACEMENT ASCENDING AORTA WITH ARCH RECONSTRUCTION-Aorto-Inominate bypass, Aorto-Carotid Bypass.. With Hypothermic circulatory arrest (N/A) TRANSESOPHAGEAL ECHOCARDIOGRAM (TEE) (N/A)  Subjective: Patient feels good, states she wants to go home. Getting up and around well, appetite slowly improving.   Objective: Vital signs in last 24 hours: Temp:  [98 F (36.7 C)-98.7 F (37.1 C)] 98 F (36.7 C) (01/23 0451) Pulse Rate:  [103-113] 103 (01/23 0451) Cardiac Rhythm: Normal sinus rhythm (01/23 0700) Resp:  [18] 18 (01/23 0451) BP: (112-141)/(59-80) 140/80 (01/23 0451) SpO2:  [91 %-94 %] 91 % (01/23 0451) Weight:  [74.8 kg (164 lb 14.4 oz)] 74.8 kg (164 lb 14.4 oz) (01/23 0451)  Physical Exam:  General:   Well appearing, alert and oriented  Breath sounds: CTAB  Heart sounds:  Sinus tachycardia. No murmur, gallop or rub.  Incisions:  Clean, dry and intact  Abdomen:  Soft and nontender  Extremities:  Warm, no edema in bilateral lower extremities   Intake/Output from previous day: 01/22 0701 - 01/23 0700 In: 820 [P.O.:820] Out: -  Intake/Output this shift: No intake/output data recorded.  Lab Results:  Recent Labs  10/23/16 0228 10/24/16 0231  WBC 15.4* 13.2*  HGB 9.2* 8.9*  HCT 28.1* 28.0*  PLT 147* 169   BMET:  Recent Labs  10/23/16 0228  NA 139  K 4.0  CL 105  CO2 29  GLUCOSE 115*  BUN 14  CREATININE 0.95  CALCIUM 8.5*    CBG (last 3)   Recent Labs  10/23/16 2140 10/24/16 0635 10/24/16 1133  GLUCAP 103* 90 128*   PT/INR:  No results for input(s): LABPROT, INR in the last 72 hours.   Assessment/Plan: S/P Procedure(s) (LRB): BIOLOGIC BENTALL PROCEDURE -21 mm Medtronic Freestyle Porcine Valve  Conduit (N/A) REPLACEMENT ASCENDING AORTA WITH ARCH RECONSTRUCTION-Aorto-Inominate bypass, Aorto-Carotid Bypass.. With Hypothermic circulatory arrest (N/A) TRANSESOPHAGEAL ECHOCARDIOGRAM (TEE) (N/A)  1 CV - sinus tachycardia, on Lopressor 25 mg BID 2 Pulm - off oxygen, continue to encourage IS 3 Renal - stable, continue lasix and potassium supplementation 4 ABL anemia - stable, no clinical signs of continued blood loss - continue iron supplementation 5 Dispo - can go today. Will need to go home on Lopressor, lasix, potassium, iron. Will need follow up appointment in office in 1 wk to remove CT sutures.  Brigid Re, PA-S 10/25/2016 8:37 AM   Chart reviewed, patient examined, agree with above. She looks good and wants to go home. She has mild resting sinus tachycardia so will keep on Lopressor. Weight is still above preop but she has no edema. Will continue diuretic for a few more days. Plan discharge this am.

## 2016-10-31 ENCOUNTER — Ambulatory Visit (INDEPENDENT_AMBULATORY_CARE_PROVIDER_SITE_OTHER): Payer: Self-pay | Admitting: *Deleted

## 2016-10-31 DIAGNOSIS — Z9889 Other specified postprocedural states: Secondary | ICD-10-CM

## 2016-10-31 DIAGNOSIS — Z09 Encounter for follow-up examination after completed treatment for conditions other than malignant neoplasm: Secondary | ICD-10-CM

## 2016-10-31 DIAGNOSIS — Z4802 Encounter for removal of sutures: Secondary | ICD-10-CM

## 2016-10-31 DIAGNOSIS — I7122 Aneurysm of the aortic arch, without rupture: Secondary | ICD-10-CM

## 2016-10-31 DIAGNOSIS — I712 Thoracic aortic aneurysm, without rupture: Secondary | ICD-10-CM

## 2016-10-31 DIAGNOSIS — I7121 Aneurysm of the ascending aorta, without rupture: Secondary | ICD-10-CM

## 2016-10-31 DIAGNOSIS — I719 Aortic aneurysm of unspecified site, without rupture: Secondary | ICD-10-CM

## 2016-10-31 NOTE — Progress Notes (Signed)
Nancy Montoya returns today for suture removal after Bentall Procedure on 10/20/16. Sutures were easily removed from her previous chest/pleural sites. Her sternal incision is well healed. She is doing well regarding diet. She is having problems with constipation and remedies were discussed. She is not requiring narcotics for pain. She will return as scheduled with a CXR.

## 2016-11-04 ENCOUNTER — Encounter: Payer: Self-pay | Admitting: Cardiology

## 2016-11-04 ENCOUNTER — Ambulatory Visit (INDEPENDENT_AMBULATORY_CARE_PROVIDER_SITE_OTHER): Payer: PPO | Admitting: Cardiology

## 2016-11-04 VITALS — BP 128/76 | HR 91 | Ht 63.0 in | Wt 148.4 lb

## 2016-11-04 DIAGNOSIS — Z8679 Personal history of other diseases of the circulatory system: Secondary | ICD-10-CM | POA: Diagnosis not present

## 2016-11-04 DIAGNOSIS — Z9889 Other specified postprocedural states: Secondary | ICD-10-CM | POA: Diagnosis not present

## 2016-11-04 NOTE — Progress Notes (Signed)
11/04/2016 Nancy Montoya   1946-07-07  [id number]  Primary Physician Haywood Pao, MD Primary Cardiologist: Dr. Angelena Form    Reason for Visit/CC: [facility] f/u after ascending aorta/arch and aortic valve replacement   HPI:  71 yo AA female with history of HTN, ascending thoracic aortic aneurysm s/p recent replacement of ascending aorta and aortic arch with aorto-innominate and aorto-left carotid bypass using a 28 x 10 x 8 x 8 x 10 mm Hemashield Platinum graft, Bentall procedure using a 21 mm Medtronic Freestyle Porcine Root, per Dr. Cyndia Bent on 10/20/16. She had a R/LHC prior to valve replacement surgery that showed no CAD. Her post operative course was fairly benign. No issues with any arrhythmias. She was discharged from [facility] on 10/25/16 with ASA and a beta blocker.  She presents to clinic today for post hospital f/u. She has done well. No major issues. She denies any chest pain or dyspnea. No orthopnea, palpitations, LEE, dizziness, syncope/ near syncope. Her surgical scar is healing well. No signs of infection. She has f/u with Dr. Cyndia Bent in 3 weeks. BP and HR are both stable.   Current Meds  Medication Sig  . aspirin EC 325 MG EC tablet Take 1 tablet (325 mg total) by mouth daily.  . ferrous sulfate 325 (65 FE) MG tablet Take 1 tablet (325 mg total) by mouth daily with breakfast.  . furosemide (LASIX) 40 MG tablet Take 1 tablet (40 mg total) by mouth daily.  . metoprolol tartrate (LOPRESSOR) 25 MG tablet Take 1 tablet (25 mg total) by mouth 2 (two) times daily.  Marland Kitchen oxyCODONE (OXY IR/ROXICODONE) 5 MG immediate release tablet Take 1-2 tablets (5-10 mg total) by mouth every 6 (six) hours as needed for severe pain.  . potassium chloride SA (K-DUR,KLOR-CON) 20 MEQ tablet Take 1 tablet (20 mEq total) by mouth daily.   Allergies  Allergen Reactions  . Codeine     hallucinations  . Latex Rash   Past Medical History:  Diagnosis Date  . Blood transfusion without reported diagnosis 2009   anemia  . Hypertension   . Thoracic aortic aneurysm without rupture (HCC)    Family History  Problem Relation Age of Onset  . Colon cancer Neg Hx    Past Surgical History:  Procedure Laterality Date  . BENTALL PROCEDURE N/A 10/20/2016   Procedure: BIOLOGIC BENTALL PROCEDURE -21 mm Medtronic Freestyle Porcine Valve Conduit;  Surgeon: Gaye Pollack, MD;  Location: [facility];  Service: Open Heart Surgery;  Laterality: N/A;  RIGHT AXILLARY ARTERY CANNULATION  BILATERAL RADIAL ARTERIAL LINES  . CARDIAC CATHETERIZATION N/A 10/11/2016   Procedure: Right/Left Heart Cath and Coronary Angiography;  Surgeon: Burnell Blanks, MD;  Location: [facility];  Service: Cardiovascular;  Laterality: N/A;  . COLONOSCOPY    . REPLACEMENT ASCENDING AORTA N/A 10/20/2016   Procedure: REPLACEMENT ASCENDING AORTA WITH ARCH RECONSTRUCTION-Aorto-Inominate bypass, Aorto-Carotid Bypass.. With Hypothermic circulatory arrest;  Surgeon: Gaye Pollack, MD;  Location: [facility];  Service: Open Heart Surgery;  Laterality: N/A;  . TEE WITHOUT CARDIOVERSION N/A 10/20/2016   Procedure: TRANSESOPHAGEAL ECHOCARDIOGRAM (TEE);  Surgeon: Gaye Pollack, MD;  Location: [facility];  Service: Open Heart Surgery;  Laterality: N/A;  . TUBAL LIGATION  1976   Social History   Social History  . Marital status: Married    Spouse name: N/A  . Number of children: 1  . Years of education: N/A   Occupational History  . Semi Retired-housework    Social  History Main Topics  . Smoking status: Former Smoker    Packs/day: 0.50    Years: 40.00    Types: Cigarettes    Start date: 10/06/1968    Quit date: 06/22/2008  . Smokeless tobacco: Never Used  . Alcohol use No  . Drug use: No  . Sexual activity: Not on file   Other Topics Concern  . Not on file   Social History Narrative  . No narrative on file     Review of Systems: General: negative for chills, fever, night sweats or weight changes.  Cardiovascular: negative for chest pain,  dyspnea on exertion, edema, orthopnea, palpitations, paroxysmal nocturnal dyspnea or shortness of breath Dermatological: negative for rash Respiratory: negative for cough or wheezing Urologic: negative for hematuria Abdominal: negative for nausea, vomiting, diarrhea, bright red blood per rectum, melena, or hematemesis Neurologic: negative for visual changes, syncope, or dizziness All other systems reviewed and are otherwise negative except as noted above.   Physical Exam:  Blood pressure 128/76, pulse 91, height 5\' 3"  (1.6 m), weight 148 lb 6.4 oz (67.3 kg), SpO2 94 %.  General appearance: alert, cooperative and no distress Neck: no carotid bruit and no JVD Lungs: clear to auscultation bilaterally Heart: regular rate and rhythm, S1, S2 normal, no murmur, click, rub or gallop Extremities: extremities normal, atraumatic, no cyanosis or edema Pulses: 2+ and symmetric Skin: Skin color, texture, turgor normal. No rashes or lesions Neurologic: Grossly normal  EKG not performed   ASSESSMENT AND PLAN:   1. Thoracic aortic aneurysm:  S/p ascending aorta/arch and aortic valve replacement, per Dr. Cyndia Bent 10/20/16. No CAD noted on pre-op cath, thus no indication for CABG. She is recovering well. No chest pain or dyspnea. No palpitations. HR and BP both well controlled. Continue ASA and BB.   PLAN  F/u with Dr. Angelena Form in 6 months. If stable, he may elect to have her f/u yearly or PRN.   Lyda Jester PA-C 11/04/2016 3:48 PM

## 2016-11-04 NOTE — Patient Instructions (Addendum)
Your physician recommends that you continue on your current medications as directed. Please refer to the Current Medication list given to you today.   Your physician wants you to follow-up in: 6 MONTHS WITH DR  MCALHANY You will receive a reminder letter in the mail two months in advance. If you don't receive a letter, please call our office to schedule the follow-up appointment. 

## 2016-11-07 ENCOUNTER — Telehealth (HOSPITAL_COMMUNITY): Payer: Self-pay | Admitting: Cardiac Rehabilitation

## 2016-11-07 NOTE — Telephone Encounter (Signed)
pc to pt to discuss enrolling in cardiac rehab.  Pt declined.  She feels she is functioning adequately on her own, other than contined poor appetite.  Pt encouraged to discuss appetite with cardiologist and/or surgeon if unrelieved or worsens.  Pt also encouraged to notify office prior to next scheduled visit if symptoms worsen.  Understanding verbalized.

## 2016-11-22 ENCOUNTER — Other Ambulatory Visit: Payer: Self-pay | Admitting: Surgery

## 2016-11-22 DIAGNOSIS — Z9889 Other specified postprocedural states: Secondary | ICD-10-CM

## 2016-11-23 ENCOUNTER — Ambulatory Visit
Admission: RE | Admit: 2016-11-23 | Discharge: 2016-11-23 | Disposition: A | Discharge status: Home / Self Care | Payer: PPO | Source: Ambulatory Visit | Attending: Surgery | Admitting: Surgery

## 2016-11-23 ENCOUNTER — Encounter: Payer: Self-pay | Admitting: Surgery

## 2016-11-23 ENCOUNTER — Ambulatory Visit (INDEPENDENT_AMBULATORY_CARE_PROVIDER_SITE_OTHER): Payer: Self-pay | Admitting: Surgery

## 2016-11-23 VITALS — BP 128/80 | HR 88 | Resp 16 | Ht 63.0 in | Wt 148.0 lb

## 2016-11-23 DIAGNOSIS — I7121 Aneurysm of the ascending aorta, without rupture: Secondary | ICD-10-CM

## 2016-11-23 DIAGNOSIS — I719 Aortic aneurysm of unspecified site, without rupture: Secondary | ICD-10-CM

## 2016-11-23 DIAGNOSIS — J90 Pleural effusion, not elsewhere classified: Secondary | ICD-10-CM | POA: Diagnosis not present

## 2016-11-23 DIAGNOSIS — I712 Thoracic aortic aneurysm, without rupture: Secondary | ICD-10-CM

## 2016-11-23 DIAGNOSIS — Z9889 Other specified postprocedural states: Secondary | ICD-10-CM

## 2016-11-23 DIAGNOSIS — Z09 Encounter for follow-up examination after completed treatment for conditions other than malignant neoplasm: Secondary | ICD-10-CM

## 2016-11-23 NOTE — Progress Notes (Signed)
     HPI: Patient returns for routine postoperative follow-up having undergone right axillary artery cannulation, replacement of the ascending aorta and aortic arch with aorto-innominate and aorto-left carotid bypass and Bentall procedure using a 21 mm Medtronic Freestyle Porcine Root on 10/20/2016. The patient's early postoperative recovery while in the hospital was notable for an uncomplicated postop course. Since hospital discharge the patient reports that she has been feeling well. She is walking without chest pain or shortness of breath and her stamina is improving. She has mid incisional soreness.   Current Outpatient Prescriptions  Medication Sig Dispense Refill  . aspirin EC 325 MG EC tablet Take 1 tablet (325 mg total) by mouth daily. 30 tablet 0  . ferrous sulfate 325 (65 FE) MG tablet Take 1 tablet (325 mg total) by mouth daily with breakfast. 30 tablet 3  . furosemide (LASIX) 40 MG tablet Take 1 tablet (40 mg total) by mouth daily. 5 tablet 0  . metoprolol tartrate (LOPRESSOR) 25 MG tablet Take 1 tablet (25 mg total) by mouth 2 (two) times daily. 60 tablet 1  . potassium chloride SA (K-DUR,KLOR-CON) 20 MEQ tablet Take 1 tablet (20 mEq total) by mouth daily. 5 tablet 0   No current facility-administered medications for this visit.     Physical Exam: BP 128/80 (BP Location: Right Arm, Patient Position: Sitting, Cuff Size: Large)   Pulse 88   Resp 16   Ht 5\' 3"  (1.6 m)   Wt 148 lb (67.1 kg)   SpO2 95% Comment: ON RA  BMI 26.22 kg/m  She looks well. Lung exam is clear. Cardiac exam shows a regular rate and rhythm with normal heart sounds. There is no murmur. Chest incisions are healing well and sternum is stable. There is no peripheral edema.   Diagnostic Tests:  CLINICAL DATA:  Status post aortic arch reconstruction. Shortness of breath and chest tenderness  EXAM: CHEST  2 VIEW  COMPARISON:  October 24, 2016  FINDINGS: There is persistent small right pleural  effusion with right base atelectasis. Lungs elsewhere clear. Heart is upper normal in size with pulmonary vascularity within normal limits. There is postoperative change in the at ascending thoracic aortic region. The aorta is diffusely prominent but unchanged in appearance compared to prior study. No bone lesions.  IMPRESSION: Aorta remains diffusely prominent without appreciable change.  There is persistent small partially loculated right pleural effusion with right base atelectasis. Lungs elsewhere clear.  Stable cardiac silhouette.  If further assessment of the aorta is felt to be warranted based on the radiographic appearance, contrast-enhanced CT or MR could be helpful to further evaluate.   Electronically Signed   By: Lowella Grip III M.D.   On: 11/23/2016 09:05   Impression:  Overall I think she is doing well. I encouraged her to continue walking. She is planning to participate in cardiac rehab. I told her that she could drive her car but should not lift anything heavier than 10 lbs for three months postop. She also has aneurysmal enlargement of the descending aorta that starts just distal to the left subclavian artery where the aorta is 45 mm and continues throughout the descending thoracic aorta to the diaphragm. This will require continued follow up and I will plan to do a CTA of the chest in 6 months.   Plan:  Return in 6 months with a CTA of the chest.   Gaye Pollack, MD [facility] [phone]

## 2017-02-06 DIAGNOSIS — Z952 Presence of prosthetic heart valve: Secondary | ICD-10-CM | POA: Diagnosis not present

## 2017-02-06 DIAGNOSIS — I10 Essential (primary) hypertension: Secondary | ICD-10-CM | POA: Diagnosis not present

## 2017-02-06 DIAGNOSIS — M859 Disorder of bone density and structure, unspecified: Secondary | ICD-10-CM | POA: Diagnosis not present

## 2017-02-06 DIAGNOSIS — E559 Vitamin D deficiency, unspecified: Secondary | ICD-10-CM | POA: Diagnosis not present

## 2017-02-06 DIAGNOSIS — D692 Other nonthrombocytopenic purpura: Secondary | ICD-10-CM | POA: Diagnosis not present

## 2017-02-06 DIAGNOSIS — M1990 Unspecified osteoarthritis, unspecified site: Secondary | ICD-10-CM | POA: Diagnosis not present

## 2017-02-06 DIAGNOSIS — Z6826 Body mass index (BMI) 26.0-26.9, adult: Secondary | ICD-10-CM | POA: Diagnosis not present

## 2017-02-06 DIAGNOSIS — I712 Thoracic aortic aneurysm, without rupture: Secondary | ICD-10-CM | POA: Diagnosis not present

## 2017-02-07 DIAGNOSIS — M859 Disorder of bone density and structure, unspecified: Secondary | ICD-10-CM | POA: Diagnosis not present

## 2017-03-03 NOTE — Addendum Note (Signed)
Addendum  created 03/03/17 0936 by Tracyann Duffell, MD   Sign clinical note    

## 2017-04-14 ENCOUNTER — Other Ambulatory Visit: Payer: Self-pay | Admitting: *Deleted

## 2017-04-14 DIAGNOSIS — I712 Thoracic aortic aneurysm, without rupture, unspecified: Secondary | ICD-10-CM

## 2017-05-24 ENCOUNTER — Encounter: Payer: PPO | Admitting: Surgery

## 2017-05-24 ENCOUNTER — Ambulatory Visit
Admission: RE | Admit: 2017-05-24 | Discharge: 2017-05-24 | Disposition: A | Discharge status: Home / Self Care | Payer: PPO | Source: Ambulatory Visit | Attending: Surgery | Admitting: Surgery

## 2017-05-24 DIAGNOSIS — I712 Thoracic aortic aneurysm, without rupture, unspecified: Secondary | ICD-10-CM

## 2017-05-24 MED ORDER — IOPAMIDOL (ISOVUE-370) INJECTION 76%
75.0000 mL | Freq: Once | INTRAVENOUS | Status: AC | PRN
  Administered 2017-05-24: 75 mL via INTRAVENOUS

## 2017-06-07 ENCOUNTER — Ambulatory Visit (INDEPENDENT_AMBULATORY_CARE_PROVIDER_SITE_OTHER): Payer: PPO | Admitting: Surgery

## 2017-06-07 ENCOUNTER — Encounter: Payer: Self-pay | Admitting: Surgery

## 2017-06-07 VITALS — BP 190/83 | HR 70 | Resp 16 | Ht 63.0 in | Wt 150.6 lb

## 2017-06-07 DIAGNOSIS — Z09 Encounter for follow-up examination after completed treatment for conditions other than malignant neoplasm: Secondary | ICD-10-CM

## 2017-06-07 DIAGNOSIS — I7123 Aneurysm of the descending thoracic aorta, without rupture: Secondary | ICD-10-CM

## 2017-06-07 DIAGNOSIS — I712 Thoracic aortic aneurysm, without rupture: Secondary | ICD-10-CM

## 2017-06-07 DIAGNOSIS — I7121 Aneurysm of the ascending aorta, without rupture: Secondary | ICD-10-CM

## 2017-06-10 ENCOUNTER — Encounter: Payer: Self-pay | Admitting: Surgery

## 2017-06-10 NOTE — Progress Notes (Signed)
HPI:  The patient returns today for followup of her distal aortic arch and descending thoracic aortic aneurysm having undergone right axillary artery cannulation, replacement of the ascending aorta and aortic arch with aorto-innominate and aorto-left carotid bypass and Bentall procedure using a 21 mm Medtronic Freestyle Porcine Root on 10/20/2016. At the time of her surgery the aortic arch aneurysm narrowed to a reasonable caliber between the left common carotid and the left subclavian artery so the distal anastomosis was done there with plans for continued followup of the remainder of her aorta and likely further intervention in the future. She has been feeling well and has gone back to work.  Current Outpatient Prescriptions  Medication Sig Dispense Refill  . metoprolol tartrate (LOPRESSOR) 25 MG tablet Take 1 tablet (25 mg total) by mouth 2 (two) times daily. 60 tablet 1   No current facility-administered medications for this visit.      Physical Exam: BP (!) 190/83 (BP Location: Right Arm, Patient Position: Sitting, Cuff Size: Large)   Pulse 70   Resp 16   Ht 5\' 3"  (1.6 m)   Wt 150 lb 9.6 oz (68.3 kg)   SpO2 95% Comment: ON RA  BMI 26.68 kg/m  She looks well. Lung exam is clear. Cardiac exam shows a regular rate and rhythm with normal heart sounds. There is no murmur. Chest incisions are well-healed and the sternum is stable There is no peripheral edema.   Diagnostic Tests:  CLINICAL DATA:  Thoracic aortic aneurysm and post Bentall procedure.  EXAM: CT ANGIOGRAPHY CHEST WITH CONTRAST  TECHNIQUE: Multidetector CT imaging of the chest was performed using the standard protocol during bolus administration of intravenous contrast. Multiplanar CT image reconstructions and MIPs were obtained to evaluate the vascular anatomy.  CONTRAST:  75 mL Isovue 370  COMPARISON:  09/28/2016  FINDINGS: Cardiovascular: Patient has undergone replacement of the aortic valve and  ascending thoracic aorta. Patient has a biologic aortic valve with reimplantation of the left and right coronary arteries. The aortic graft is patent. There is a patent aorto-innominate bypass graft. There is an aortic-left common carotid graft that is patent. Left subclavian artery is patent. Left subclavian artery appears to be originating from the native aortic arch. Fusiform aneurysm of the posterior aortic arch measuring up to 4.7 cm and minimally changed. The proximal descending thoracic aorta is aneurysmal measuring 5.1 cm and previously measured 4.8 cm. The mid descending thoracic aorta is stable measuring 4.1 cm. Distal descending thoracic aorta is stable measuring 3.6 cm. No evidence for aortic dissection. The aorta at the hiatus measures 3.7 cm and minimally changed. Celiac trunk and main branch vessels are patent.  Left innominate vein is small and there appears to be significant stenosis of the lower left internal jugular vein. Prominent collateral vein in the anterior lower neck. There are also multiple venous structures in the upper thoracic paraspinal region related to collateral flow secondary to the narrowing in the left innominate vein. Postsurgical changes in the right axillary artery related to previous cannulation for extracorporeal circulation. Right axillary artery is patent without a dissection.  Mediastinum/Nodes: No significant chest lymphadenopathy. There is no significant mediastinal or pericardial fluid. Expected postoperative changes from median sternotomy.  Lungs/Pleura: Trachea and mainstem bronchi are patent. No significant pleural fluid. Again noted is a large bulla along the posterior aspect of the right middle lobe and along the right major fissure. This bulla measures up to 5 cm. There is some atelectasis and volume loss  in left lower lobe.  Upper Abdomen: Multiple cysts in the visualized left kidney. No acute abnormality in the upper  abdomen.  Musculoskeletal: No acute bone abnormality. Again noted is a large lipoma involving the right sternocleidomastoid muscle.  Review of the MIP images confirms the above findings.  IMPRESSION: Expected postoperative changes from replacement of the aortic root, aortic valve and ascending thoracic aorta. Vascular grafts are patent without complicating features.  Fusiform aneurysm of the posterior aortic arch and descending thoracic aorta. Minimal enlargement of the proximal descending thoracic aorta, measuring up to 5.1 cm as described. Negative for an aortic dissection.  Extensive collateral vein formation in the upper chest appears to be secondary to a stenosis involving the left innominate vein and probably the lower left internal jugular vein.   Electronically Signed   By: Markus Daft M.D.   On: 05/24/2017 12:49  Impression:  She has had further enlargement of the proximal descending aortic aneurysm. The aorta here measured 4.8 cm on her preop CTA on 09/28/2016 and is now 5.1 cm. Her ascending, arch and descending aorta had increased significantly in size from 2008 to 2017 when I first saw her. I think the descending aorta is going to continue to enlarge and with poorly controlled BP her risk of enlargement, dissection or rupture in the future is high. Her BP is 190/83 in the right arm and 179/92 in the left arm. She says that it is not usually that high but higher than it should be. She is only on Lopressor 25 bid so I asked her to increased that to 50 mg bid. She is going to contact her PCP or Dr. Angelena Form to follow up on her BP in the next week since she does not have an appt with them for a couple months. Her renal function is normal so she probably needs to get on an ACE inhibitor.   Her distal arch and descending aneurysm looks like it may be amenable to treatment with a stent graft with left subclavian transposition or carotid-subclavia bypass. I will have her see  Dr. Trula Slade of vascular surgery to get another opinion.    Plan:  1. Evaluation by Dr. Trula Slade for consideration of TEVAR for this aortic aneurym  2. Follow up with Dr. Osborne Casco or Dr. Lonna Cobb concerning her poorly controlled hypertension.  I spent 15 minutes performing this established patient evaluation and > 50% of this time was spent face to face counseling and coordinating the care of this patient's aortic aneurysm.  Gaye Pollack, MD [facility] [phone]

## 2017-06-15 DIAGNOSIS — E559 Vitamin D deficiency, unspecified: Secondary | ICD-10-CM | POA: Diagnosis not present

## 2017-06-15 DIAGNOSIS — I10 Essential (primary) hypertension: Secondary | ICD-10-CM | POA: Diagnosis not present

## 2017-06-15 DIAGNOSIS — I712 Thoracic aortic aneurysm, without rupture: Secondary | ICD-10-CM | POA: Diagnosis not present

## 2017-06-15 DIAGNOSIS — M859 Disorder of bone density and structure, unspecified: Secondary | ICD-10-CM | POA: Diagnosis not present

## 2017-06-15 DIAGNOSIS — Z6825 Body mass index (BMI) 25.0-25.9, adult: Secondary | ICD-10-CM | POA: Diagnosis not present

## 2017-06-15 DIAGNOSIS — D126 Benign neoplasm of colon, unspecified: Secondary | ICD-10-CM | POA: Diagnosis not present

## 2017-06-15 DIAGNOSIS — D692 Other nonthrombocytopenic purpura: Secondary | ICD-10-CM | POA: Diagnosis not present

## 2017-06-15 DIAGNOSIS — M1990 Unspecified osteoarthritis, unspecified site: Secondary | ICD-10-CM | POA: Diagnosis not present

## 2017-06-15 DIAGNOSIS — Z23 Encounter for immunization: Secondary | ICD-10-CM | POA: Diagnosis not present

## 2017-06-15 DIAGNOSIS — Z952 Presence of prosthetic heart valve: Secondary | ICD-10-CM | POA: Diagnosis not present

## 2017-06-30 DIAGNOSIS — I10 Essential (primary) hypertension: Secondary | ICD-10-CM | POA: Diagnosis not present

## 2017-06-30 DIAGNOSIS — Z6826 Body mass index (BMI) 26.0-26.9, adult: Secondary | ICD-10-CM | POA: Diagnosis not present

## 2017-07-06 ENCOUNTER — Other Ambulatory Visit: Payer: Self-pay | Admitting: Internal Medicine

## 2017-07-06 DIAGNOSIS — Z1231 Encounter for screening mammogram for malignant neoplasm of breast: Secondary | ICD-10-CM

## 2017-07-11 DIAGNOSIS — H25013 Cortical age-related cataract, bilateral: Secondary | ICD-10-CM | POA: Diagnosis not present

## 2017-07-11 DIAGNOSIS — H2513 Age-related nuclear cataract, bilateral: Secondary | ICD-10-CM | POA: Diagnosis not present

## 2017-07-11 DIAGNOSIS — H40053 Ocular hypertension, bilateral: Secondary | ICD-10-CM | POA: Diagnosis not present

## 2017-07-17 ENCOUNTER — Encounter: Payer: Self-pay | Admitting: Cardiovascular Disease

## 2017-07-17 ENCOUNTER — Ambulatory Visit (INDEPENDENT_AMBULATORY_CARE_PROVIDER_SITE_OTHER): Payer: PPO | Admitting: Cardiovascular Disease

## 2017-07-17 VITALS — BP 170/82 | HR 57 | Ht 63.0 in | Wt 152.6 lb

## 2017-07-17 DIAGNOSIS — I712 Thoracic aortic aneurysm, without rupture, unspecified: Secondary | ICD-10-CM

## 2017-07-17 DIAGNOSIS — I10 Essential (primary) hypertension: Secondary | ICD-10-CM | POA: Diagnosis not present

## 2017-07-17 MED ORDER — OLMESARTAN MEDOXOMIL 40 MG PO TABS: 40.0000 mg | ORAL_TABLET | Freq: Every day | ORAL | 11 refills | Status: DC

## 2017-07-17 NOTE — Patient Instructions (Signed)
Medication Instructions:  Your physician has recommended you make the following change in your medication:  Increase Olmesartan to 40 mg by mouth daily.    Labwork: Your physician recommends that you return for lab work in: one week  (BMET)   Testing/Procedures: none  Follow-Up: Your physician recommends that you schedule a follow-up appointment in: 2-3 months with pharmacist in hypertension clinic.  Your physician recommends that you schedule a follow-up appointment in: 12 months with Dr. Angelena Form.  Please call our office in about 9 months to schedule this appointment     Any Other Special Instructions Will Be Listed Below (If Applicable).     If you need a refill on your cardiac medications before your next appointment, please call your pharmacy.

## 2017-07-17 NOTE — Progress Notes (Signed)
Chief Complaint  Patient presents with  . Follow-up    Thoracic aortic aneurysm without rupture    History of Present Illness: 71 yo female with history of HTN and thoracic aortic aneurysm who is here today for cardiac follow up. I met her in January 2018 to discuss a cardiac cath which was required as part of her pre-operative workup before planned replacement of her aorta. Cardiac cath 10/11/16 with no evidence of CAD. She underwent replacement of the aortic arch with aorto-innominate and aorto-left carotid bypass with Bentall procedure using Medtronic 21 mm Freestyle porcine root on 10/20/16.   He is here today for follow up. The patient denies any chest pain, dyspnea, palpitations, lower extremity edema, orthopnea, PND, dizziness, near syncope or syncope.    Primary Care Physician: Haywood Pao, MD   Past Medical History:  Diagnosis Date  . Blood transfusion without reported diagnosis 2009   anemia  . Hypertension   . Thoracic aortic aneurysm without rupture [facility])     Past Surgical History:  Procedure Laterality Date  . BENTALL PROCEDURE N/A 10/20/2016   Procedure: BIOLOGIC BENTALL PROCEDURE -21 mm Medtronic Freestyle Porcine Valve Conduit;  Surgeon: Gaye Pollack, MD;  Location: [place];  Service: Open Heart Surgery;  Laterality: N/A;  RIGHT AXILLARY ARTERY CANNULATION  BILATERAL RADIAL ARTERIAL LINES  . CARDIAC CATHETERIZATION N/A 10/11/2016   Procedure: Right/Left Heart Cath and Coronary Angiography;  Surgeon: Burnell Blanks, MD;  Location: [place] [facility];  Service: Cardiovascular;  Laterality: N/A;  . COLONOSCOPY    . REPLACEMENT ASCENDING AORTA N/A 10/20/2016   Procedure: REPLACEMENT ASCENDING AORTA WITH ARCH RECONSTRUCTION-Aorto-Inominate bypass, Aorto-Carotid Bypass.. With Hypothermic circulatory arrest;  Surgeon: Gaye Pollack, MD;  Location: [place];  Service: Open Heart Surgery;  Laterality: N/A;  . TEE WITHOUT CARDIOVERSION N/A 10/20/2016   Procedure:  TRANSESOPHAGEAL ECHOCARDIOGRAM (TEE);  Surgeon: Gaye Pollack, MD;  Location: [place];  Service: Open Heart Surgery;  Laterality: N/A;  . TUBAL LIGATION  1976    Current Outpatient Prescriptions  Medication Sig Dispense Refill  . metoprolol tartrate (LOPRESSOR) 25 MG tablet Take 50 mg by mouth 2 (two) times daily. Pt takes 2 tabs im the AM and 2 tabs PM    . olmesartan (BENICAR) 40 MG tablet Take 1 tablet (40 mg total) by mouth daily. 30 tablet 11   No current facility-administered medications for this visit.     Allergies  Allergen Reactions  . Codeine     hallucinations  . Latex Rash    Social History   Social History  . Marital status: Married    Spouse name: N/A  . Number of children: 1  . Years of education: N/A   Occupational History  . Semi Retired-housework    Social History Main Topics  . Smoking status: Former Smoker    Packs/day: 0.50    Years: 40.00    Types: Cigarettes    Start date: 10/06/1968    Quit date: 06/22/2008  . Smokeless tobacco: Never Used  . Alcohol use No  . Drug use: No  . Sexual activity: Not on file   Other Topics Concern  . Not on file   Social History Narrative  . No narrative on file    Family History  Problem Relation Age of Onset  . Colon cancer Neg Hx     Review of Systems:  As stated in the HPI and otherwise negative.   BP (!) 170/82   Pulse Marland Kitchen)  57   Ht _0  (1.6 m)   Wt 152 lb 9.6 oz (69.2 kg)   SpO2 97%   BMI 27.03 kg/m   Physical Examination:  General: Well developed, well nourished, NAD  HEENT: OP clear, mucus membranes moist  SKIN: warm, dry. No rashes. Neuro: No focal deficits  Musculoskeletal: Muscle strength 5/5 all ext  Psychiatric: Mood and affect normal  Neck: No JVD, no carotid bruits, no thyromegaly, no lymphadenopathy.  Lungs:Clear bilaterally, no wheezes, rhonci, crackles Cardiovascular: Regular rate and rhythm. No murmurs, gallops or rubs. Abdomen:Soft. Bowel sounds present. Non-tender.    Extremities: No lower extremity edema. Pulses are 2 + in the bilateral DP/PT.  CTA chest 09/28/16: 1. Interval aneurysmal dilatation of the entirety of the thoracic aorta with the ascending thoracic aorta measuring 62 mm in diameter, previously, 38 mm. No evidence of thoracic aortic dissection or periaortic stranding. 2. The thoracic aortic aneurysm extends to involve the suprarenal abdominal aorta, measuring approximately 3.3 cm between the takeoff of the SMA and celiac arteries. The abdominal aorta tapers to a normal caliber below the level of the renal arteries. 3. Coronary artery calcifications. Aortic Atherosclerosis (ICD10-170.0)  Echo 09/21/16: Left ventricle: The cavity size was normal. Wall thickness was   increased in a pattern of mild LVH. Systolic function was normal.   The estimated ejection fraction was in the range of 60% to 65%.   Wall motion was normal; there were no regional wall motion   abnormalities. Doppler parameters are consistent with abnormal   left ventricular relaxation (grade 1 diastolic dysfunction). The   E/e&' ratio is between 8-15, suggesting indeterminate LV filling   pressure. - Aortic valve: Trileaflet. There was moderate regurgitation.   Regurgitation pressure half-time: 342 ms. - Aorta: Severely dilated ascending aorta up to 6.2 cm. No evidence   for dissection. The proximal descending aorta measures 4.4 cm.   The mid-descending aorta measures 3.1 cm. - Mitral valve: Mildly thickened leaflets . There was mild   regurgitation. - Left atrium: The atrium was normal in size. - Tricuspid valve: There was trivial regurgitation. - Pulmonic valve: There was mild regurgitation. - Pulmonary arteries: PA peak pressure: 22 mm Hg (S). - Inferior vena cava: The IVC measures <2.1 cm, but does not   collapse >50%, suggesting an elevated RA pressure of 8 mmHg.  Impressions:  - LVEF 60-65%, mild LVH, normal wall motion, diastolic dyfunction,    indeterminate LV filling pressure, severely dilated ascending   aorta up to 6.2 cm, the proximal descending aorta measures 4.4 cm   and the mid-descending aorta measures 3.1 cm, normal LA size,   trivial TR, mild PI, normal RVSP, elevated RA pressure of 8   mmHg,. no pericardial effusion. The patient reports she has been   seen by Dr. Cyndia Bent with CT surgery and has a follow-up next week   - findings are similar to that seen by CT of the neck.  EKG:  EKG is ordered today. The ekg ordered today demonstrates NSR, rate 63 bpm. PACs. RBBB  Recent Labs: 10/18/2016: ALT 19 10/21/2016: Magnesium 2.4 10/23/2016: BUN 14; Creatinine, Ser 0.95; Potassium 4.0; Sodium 139 10/24/2016: Hemoglobin 8.9; Platelets 169   Lipid Panel No results found for: CHOL, TRIG, HDL, CHOLHDL, VLDL, LDLCALC, LDLDIRECT   Wt Readings from Last 3 Encounters:  07/17/17 152 lb 9.6 oz (69.2 kg)  06/07/17 150 lb 9.6 oz (68.3 kg)  11/23/16 148 lb (67.1 kg)     Other studies Reviewed: Additional studies/  records that were reviewed today include: . Review of the above records demonstrates:    Assessment and Plan:   1. Thoracic aortic aneurysm: She is s/p aortic arch replacement with Bentall procedure and porcine aortic valve replacement. Will continue ASA and beta blocker. BP not controlled (see below).    2. HTN: BP has been elevated at home. Will increase Benicar to 40 mg daily. BMET one week. Follow up 2-3 months in HTN clinic but she will follow BP at home and call if it remains above.   Current medicines are reviewed at length with the patient today.  The patient does not have concerns regarding medicines.  The following changes have been made:  no change  Labs/ tests ordered today include:   Orders Placed This Encounter  Procedures  . Basic Metabolic Panel (BMET)  . EKG 12-Lead    Disposition:   FU with me in one year.   Signed, Lauree Chandler, MD 07/17/2017 11:46 AM    [address], [place], [place]  [address] Phone: [phone]; Fax: [phone]

## 2017-07-19 ENCOUNTER — Encounter: Payer: Self-pay | Admitting: Surgery

## 2017-07-19 ENCOUNTER — Ambulatory Visit (INDEPENDENT_AMBULATORY_CARE_PROVIDER_SITE_OTHER): Payer: PPO | Admitting: Surgery

## 2017-07-19 VITALS — BP 190/97 | HR 54 | Temp 97.4°F | Resp 16 | Ht 63.0 in | Wt 152.3 lb

## 2017-07-19 DIAGNOSIS — I712 Thoracic aortic aneurysm, without rupture, unspecified: Secondary | ICD-10-CM

## 2017-07-19 NOTE — Progress Notes (Signed)
[facility]  Patient name: Nancy Montoya MRN: [id number] DOB: 1946-05-28 Sex: female   REQUESTING PROVIDER:    Dr. Cyndia Bent   REASON FOR CONSULT:    TAAA  HISTORY OF PRESENT ILLNESS:   Nancy Montoya is a 71 y.o. female, who is Referred for evaluation of a descending thoracic aortic aneurysm.  The patient is status post replacement of her ascending aorta with an aorto to innominate and aorto left carotid bypass graft.  She has had progressive enlargement of her descending thoracic aorta and is here today to discuss repair.  The patient has had worsening control of her hypertension.  She is a former smoker.  She is not on a statin.  PAST MEDICAL HISTORY    Past Medical History:  Diagnosis Date  . Blood transfusion without reported diagnosis 2009   anemia  . Hypertension   . Thoracic aortic aneurysm without rupture ([place])      FAMILY HISTORY   Family History  Problem Relation Age of Onset  . Colon cancer Neg Hx     SOCIAL HISTORY:   Social History   Social History  . Marital status: Married    Spouse name: N/A  . Number of children: 1  . Years of education: N/A   Occupational History  . Semi Retired-housework    Social History Main Topics  . Smoking status: Former Smoker    Packs/day: 0.50    Years: 40.00    Types: Cigarettes    Start date: 10/06/1968    Quit date: 06/22/2008  . Smokeless tobacco: Never Used  . Alcohol use No  . Drug use: No  . Sexual activity: Not on file   Other Topics Concern  . Not on file   Social History Narrative  . No narrative on file    ALLERGIES:    Allergies  Allergen Reactions  . Codeine     hallucinations  . Latex Rash    CURRENT MEDICATIONS:    Current Outpatient Prescriptions  Medication Sig Dispense Refill  . aspirin EC 81 MG tablet Take 81 mg by mouth daily.    . metoprolol tartrate (LOPRESSOR) 25 MG tablet Take 50 mg by mouth 2 (two) times  daily. Pt takes 2 tabs im the AM and 2 tabs PM    . olmesartan (BENICAR) 40 MG tablet Take 1 tablet (40 mg total) by mouth daily. (Patient taking differently: Take 20 mg by mouth daily. 2 tabs QD) 30 tablet 11   No current facility-administered medications for this visit.     REVIEW OF SYSTEMS:   [X]  denotes positive finding, [ ]  denotes negative finding Cardiac  Comments:  Chest pain or chest pressure:    Shortness of breath upon exertion:    Short of breath when lying flat:    Irregular heart rhythm:        Vascular    Pain in calf, thigh, or hip brought on by ambulation:    Pain in feet at night that wakes you up from your sleep:     Blood clot in your veins:    Leg swelling:         Pulmonary    Oxygen at home:    Productive cough:     Wheezing:         Neurologic    Sudden weakness in arms or legs:     Sudden numbness in arms or legs:     Sudden onset of difficulty  speaking or slurred speech:    Temporary loss of vision in one eye:     Problems with dizziness:         Gastrointestinal    Blood in stool:      Vomited blood:         Genitourinary    Burning when urinating:     Blood in urine:        Psychiatric    Major depression:         Hematologic    Bleeding problems:    Problems with blood clotting too easily:        Skin    Rashes or ulcers:        Constitutional    Fever or chills:     PHYSICAL EXAM:   Vitals:   07/19/17 1118 07/19/17 1122  BP: (!) 190/90 (!) 190/97  Pulse: (!) 54   Resp: 16   Temp: (!) 97.4 F (36.3 C)   TempSrc: Oral   SpO2: 99%   Weight: 152 lb 4.8 oz (69.1 kg)   Height: 5\' 3"  (1.6 m)     GENERAL: The patient is a well-nourished female, in no acute distress. The vital signs are documented above. CARDIAC: There is a regular rate and rhythm.  VASCULAR: Faintly palpable pedal pulses.  No carotid bruits. PULMONARY: Nonlabored respirations ABDOMEN: Soft and non-tender with normal pitched bowel sounds.    MUSCULOSKELETAL: There are no major deformities or cyanosis. NEUROLOGIC: No focal weakness or paresthesias are detected. SKIN: There are no ulcers or rashes noted. PSYCHIATRIC: The patient has a normal affect.  STUDIES:   I have reviewed her CT scan with the following findings: Expected postoperative changes from replacement of the aortic root, aortic valve and ascending thoracic aorta. Vascular grafts are patent without complicating features.  Fusiform aneurysm of the posterior aortic arch and descending thoracic aorta. Minimal enlargement of the proximal descending thoracic aorta, measuring up to 5.1 cm as described. Negative for an aortic dissection.  Extensive collateral vein formation in the upper chest appears to be secondary to a stenosis involving the left innominate vein and probably the lower left internal jugular vein   No significant carotid artery stenosis on duplex  ASSESSMENT and PLAN   Descending thoracic aortic aneurysm: I discussed with the patient proceeding with a left subclavian to left carotid transposition versus bypass in addition to stent graft repair of her descending thoracic aortic aneurysm.  We discussed risks and benefits of the procedure including the risk of paralysis, nerve injury, stroke and bleeding.  All of her questions were answered.  I will coordinate with Dr. Cyndia Bent to find a time for repair.  She would benefit from being placed on a statin prior to discharge.   Annamarie Major, MD [facility] [phone] Pager [phone]

## 2017-07-19 NOTE — H&P (View-Only) (Signed)
[facility]ist [facility] [place]  Patient name: Nancy Montoya MRN: [id number] DOB: 1945-10-22 Sex: female   REQUESTING PROVIDER:    Dr. Cyndia Bent   REASON FOR CONSULT:    TAAA  HISTORY OF PRESENT ILLNESS:   Nancy Montoya is a 71 y.o. female, who is Referred for evaluation of a descending thoracic aortic aneurysm.  The patient is status post replacement of her ascending aorta with an aorto to innominate and aorto left carotid bypass graft.  She has had progressive enlargement of her descending thoracic aorta and is here today to discuss repair.  The patient has had worsening control of her hypertension.  She is a former smoker.  She is not on a statin.  PAST MEDICAL HISTORY    Past Medical History:  Diagnosis Date  . Blood transfusion without reported diagnosis 2009   anemia  . Hypertension   . Thoracic aortic aneurysm without rupture ([place])      FAMILY HISTORY   Family History  Problem Relation Age of Onset  . Colon cancer Neg Hx     SOCIAL HISTORY:   Social History   Social History  . Marital status: Married    Spouse name: N/A  . Number of children: 1  . Years of education: N/A   Occupational History  . Semi Retired-housework    Social History Main Topics  . Smoking status: Former Smoker    Packs/day: 0.50    Years: 40.00    Types: Cigarettes    Start date: 10/06/1968    Quit date: 06/22/2008  . Smokeless tobacco: Never Used  . Alcohol use No  . Drug use: No  . Sexual activity: Not on file   Other Topics Concern  . Not on file   Social History Narrative  . No narrative on file    ALLERGIES:    Allergies  Allergen Reactions  . Codeine     hallucinations  . Latex Rash    CURRENT MEDICATIONS:    Current Outpatient Prescriptions  Medication Sig Dispense Refill  . aspirin EC 81 MG tablet Take 81 mg by mouth daily.    . metoprolol tartrate (LOPRESSOR) 25 MG tablet Take 50 mg by mouth 2 (two) times  daily. Pt takes 2 tabs im the AM and 2 tabs PM    . olmesartan (BENICAR) 40 MG tablet Take 1 tablet (40 mg total) by mouth daily. (Patient taking differently: Take 20 mg by mouth daily. 2 tabs QD) 30 tablet 11   No current facility-administered medications for this visit.     REVIEW OF SYSTEMS:   [X]  denotes positive finding, [ ]  denotes negative finding Cardiac  Comments:  Chest pain or chest pressure:    Shortness of breath upon exertion:    Short of breath when lying flat:    Irregular heart rhythm:        Vascular    Pain in calf, thigh, or hip brought on by ambulation:    Pain in feet at night that wakes you up from your sleep:     Blood clot in your veins:    Leg swelling:         Pulmonary    Oxygen at home:    Productive cough:     Wheezing:         Neurologic    Sudden weakness in arms or legs:     Sudden numbness in arms or legs:     Sudden onset of difficulty  speaking or slurred speech:    Temporary loss of vision in one eye:     Problems with dizziness:         Gastrointestinal    Blood in stool:      Vomited blood:         Genitourinary    Burning when urinating:     Blood in urine:        Psychiatric    Major depression:         Hematologic    Bleeding problems:    Problems with blood clotting too easily:        Skin    Rashes or ulcers:        Constitutional    Fever or chills:     PHYSICAL EXAM:   Vitals:   07/19/17 1118 07/19/17 1122  BP: (!) 190/90 (!) 190/97  Pulse: (!) 54   Resp: 16   Temp: (!) 97.4 F (36.3 C)   TempSrc: Oral   SpO2: 99%   Weight: 152 lb 4.8 oz (69.1 kg)   Height: 5\' 3"  (1.6 m)     GENERAL: The patient is a well-nourished female, in no acute distress. The vital signs are documented above. CARDIAC: There is a regular rate and rhythm.  VASCULAR: Faintly palpable pedal pulses.  No carotid bruits. PULMONARY: Nonlabored respirations ABDOMEN: Soft and non-tender with normal pitched bowel sounds.    MUSCULOSKELETAL: There are no major deformities or cyanosis. NEUROLOGIC: No focal weakness or paresthesias are detected. SKIN: There are no ulcers or rashes noted. PSYCHIATRIC: The patient has a normal affect.  STUDIES:   I have reviewed her CT scan with the following findings: Expected postoperative changes from replacement of the aortic root, aortic valve and ascending thoracic aorta. Vascular grafts are patent without complicating features.  Fusiform aneurysm of the posterior aortic arch and descending thoracic aorta. Minimal enlargement of the proximal descending thoracic aorta, measuring up to 5.1 cm as described. Negative for an aortic dissection.  Extensive collateral vein formation in the upper chest appears to be secondary to a stenosis involving the left innominate vein and probably the lower left internal jugular vein   No significant carotid artery stenosis on duplex  ASSESSMENT and PLAN   Descending thoracic aortic aneurysm: I discussed with the patient proceeding with a left subclavian to left carotid transposition versus bypass in addition to stent graft repair of her descending thoracic aortic aneurysm.  We discussed risks and benefits of the procedure including the risk of paralysis, nerve injury, stroke and bleeding.  All of her questions were answered.  I will coordinate with Dr. Cyndia Bent to find a time for repair.  She would benefit from being placed on a statin prior to discharge.   Annamarie Major, MD [facility] of [facility] [phone] Pager [phone]

## 2017-07-21 ENCOUNTER — Other Ambulatory Visit: Payer: Self-pay | Admitting: *Deleted

## 2017-07-21 ENCOUNTER — Encounter: Payer: Self-pay | Admitting: *Deleted

## 2017-07-21 NOTE — Progress Notes (Signed)
Patient requested I mail her a letter with her pre-op instructions from [facility]. OR case number called to Cecilio Asper at [facility] for Dr. Cyndia Bent after we agreed on surgery date.

## 2017-07-24 ENCOUNTER — Ambulatory Visit
Admission: RE | Admit: 2017-07-24 | Discharge: 2017-07-24 | Disposition: A | Discharge status: Home / Self Care | Payer: PPO | Source: Ambulatory Visit | Attending: Internal Medicine | Admitting: Internal Medicine

## 2017-07-24 ENCOUNTER — Other Ambulatory Visit: Payer: PPO | Admitting: *Deleted

## 2017-07-24 DIAGNOSIS — Z1231 Encounter for screening mammogram for malignant neoplasm of breast: Secondary | ICD-10-CM | POA: Diagnosis not present

## 2017-07-24 DIAGNOSIS — I10 Essential (primary) hypertension: Secondary | ICD-10-CM

## 2017-07-25 ENCOUNTER — Telehealth: Payer: Self-pay | Admitting: Cardiovascular Disease

## 2017-07-25 DIAGNOSIS — Z6826 Body mass index (BMI) 26.0-26.9, adult: Secondary | ICD-10-CM | POA: Diagnosis not present

## 2017-07-25 DIAGNOSIS — J189 Pneumonia, unspecified organism: Secondary | ICD-10-CM | POA: Diagnosis not present

## 2017-07-25 DIAGNOSIS — I712 Thoracic aortic aneurysm, without rupture: Secondary | ICD-10-CM | POA: Diagnosis not present

## 2017-07-25 DIAGNOSIS — R05 Cough: Secondary | ICD-10-CM | POA: Diagnosis not present

## 2017-07-25 DIAGNOSIS — R7989 Other specified abnormal findings of blood chemistry: Secondary | ICD-10-CM | POA: Insufficient documentation

## 2017-07-25 LAB — BASIC METABOLIC PANEL
BUN/Creatinine Ratio: 11 — ABNORMAL LOW (ref 12–28)
BUN: 14 mg/dL (ref 8–27)
CALCIUM: 9.4 mg/dL (ref 8.7–10.3)
CHLORIDE: 106 mmol/L (ref 96–106)
CO2: 20 mmol/L (ref 20–29)
Creatinine, Ser: 1.3 mg/dL — ABNORMAL HIGH (ref 0.57–1.00)
GFR calc Af Amer: 47 mL/min/{1.73_m2} — ABNORMAL LOW (ref >59)
GFR calc non Af Amer: 41 mL/min/{1.73_m2} — ABNORMAL LOW (ref >59)
Glucose: 95 mg/dL (ref 65–99)
POTASSIUM: 3.9 mmol/L (ref 3.5–5.2)
Sodium: 145 mmol/L — ABNORMAL HIGH (ref 134–144)

## 2017-07-25 NOTE — Telephone Encounter (Signed)
Notes recorded by Burnell Blanks, MD on 07/25/2017 at 9:55 AM EDT Slight increase in creatinine with increase in Benicar. We can continue at current dose. Please encourage her to drink extra fluid (8 glasses of water per day). Repeat BMET in 10 days. Thanks, [employer] with the pt and informed her of lab results and recommendations per Dr Angelena Form.  Advised the pt to drink extra PO fluids like water.  Advised on healthy amount of H2O to drink. Scheduled the pt a repeat BMET x 10 days on 08/04/17. Pt verbalized understanding and agrees with this plan.

## 2017-07-25 NOTE — Telephone Encounter (Signed)
New message  ° ° ° ° °Pt returning call for lab results  °

## 2017-08-01 ENCOUNTER — Telehealth: Payer: Self-pay | Admitting: Cardiovascular Disease

## 2017-08-01 NOTE — Telephone Encounter (Signed)
I spoke with pt and told her she needed blood work on 11/2

## 2017-08-01 NOTE — Telephone Encounter (Signed)
New Message  Pt call requesting to speak with RN about lab appt on 11/2. Pt states she completed labs on 10/22. Does she need appt for 11/2. Please call back to discuss

## 2017-08-04 ENCOUNTER — Other Ambulatory Visit: Payer: PPO | Admitting: *Deleted

## 2017-08-04 DIAGNOSIS — R7989 Other specified abnormal findings of blood chemistry: Secondary | ICD-10-CM

## 2017-08-04 LAB — BASIC METABOLIC PANEL
BUN / CREAT RATIO: 13 (ref 12–28)
BUN: 13 mg/dL (ref 8–27)
CO2: 25 mmol/L (ref 20–29)
CREATININE: 0.99 mg/dL (ref 0.57–1.00)
Calcium: 9.3 mg/dL (ref 8.7–10.3)
Chloride: 104 mmol/L (ref 96–106)
GFR, EST AFRICAN AMERICAN: 66 mL/min/{1.73_m2} (ref >59)
GFR, EST NON AFRICAN AMERICAN: 57 mL/min/{1.73_m2} — AB (ref >59)
Glucose: 82 mg/dL (ref 65–99)
Potassium: 4 mmol/L (ref 3.5–5.2)
SODIUM: 142 mmol/L (ref 134–144)

## 2017-08-07 DIAGNOSIS — E559 Vitamin D deficiency, unspecified: Secondary | ICD-10-CM | POA: Diagnosis not present

## 2017-08-07 DIAGNOSIS — I10 Essential (primary) hypertension: Secondary | ICD-10-CM | POA: Diagnosis not present

## 2017-08-07 DIAGNOSIS — R82998 Other abnormal findings in urine: Secondary | ICD-10-CM | POA: Diagnosis not present

## 2017-08-08 ENCOUNTER — Other Ambulatory Visit: Payer: Self-pay | Admitting: *Deleted

## 2017-08-08 ENCOUNTER — Encounter: Payer: Self-pay | Admitting: Surgery

## 2017-08-10 NOTE — Pre-Procedure Instructions (Signed)
Nancy Montoya  08/10/2017      [id number] [place], [place] [address] [place] [address] Phone: [phone] Fax: [phone]    Your procedure is scheduled on Thursday, August 17, 2017.  Report to [facility] Admitting at 05:30 A.M.  Call this number if you have problems the morning of surgery:  [phone]   Remember:  Do not eat food or drink liquids after midnight.   Take these medicines the morning of surgery with A SIP OF WATER : Metoprolol (Lopressor)  7 days prior to surgery STOP taking any Aspirin (unless otherwise instructed by your surgeon), Aleve, Naproxen, Ibuprofen, Motrin, Advil, Goody's, BC's, all herbal medications, fish oil, and all vitamins   Do not wear jewelry, make-up or nail polish.  Do not wear lotions, powders, or perfumes, or deodorant.  Do not shave 48 hours prior to surgery.   Do not bring valuables to the hospital.   [facility] is not responsible for any belongings or valuables.  Contacts, dentures or bridgework may not be worn into surgery.  Leave your suitcase in the car.  After surgery it may be brought to your room.  For patients admitted to the hospital, discharge time will be determined by your treatment team.  Patients discharged the day of surgery will not be allowed to drive home.   Special instructions:   [place]- Preparing For Surgery  Before surgery, you can play an important role. Because skin is not sterile, your skin needs to be as free of germs as possible. You can reduce the number of germs on your skin by washing with CHG (chlorahexidine gluconate) Soap before surgery.  CHG is an antiseptic cleaner which kills germs and bonds with the skin to continue killing germs even after washing.  Please do not use if you have an allergy to CHG or antibacterial soaps. If your skin becomes reddened/irritated stop using the CHG.  Do not  shave (including legs and underarms) for at least 48 hours prior to first CHG shower.  Please follow these instructions carefully.   1. Shower the NIGHT BEFORE SURGERY and the MORNING OF SURGERY with CHG.   2. If you chose to wash your hair, wash your hair first as usual with your normal shampoo.  3. After you shampoo, rinse your hair and body thoroughly to remove the shampoo.  4. Use CHG as you would any other liquid soap. You can apply CHG directly to the skin and wash gently with a scrungie or a clean washcloth.   5. Apply the CHG Soap to your body ONLY FROM THE NECK DOWN.  Do not use on open wounds or open sores. Avoid contact with your eyes, ears, mouth and genitals (private parts). Wash Face and genitals (private parts)  with your normal soap.  6. Wash thoroughly, paying special attention to the area where your surgery will be performed.  7. Thoroughly rinse your body with warm water from the neck down.  8. DO NOT shower/wash with your normal soap after using and rinsing off the CHG Soap.  9. Pat yourself dry with a CLEAN TOWEL.  10. Wear CLEAN PAJAMAS to bed the night before surgery, wear comfortable clothes the morning of surgery  11. Place CLEAN SHEETS on your bed the night of your first shower and DO NOT SLEEP WITH PETS.    Day of Surgery: Do not apply any deodorants/lotions.  Please wear clean clothes to the hospital/surgery center.      Please read over the following fact sheets that you were given. Coughing and Deep Breathing, MRSA Information and Surgical Site Infection Prevention

## 2017-08-11 ENCOUNTER — Encounter (HOSPITAL_COMMUNITY): Payer: Self-pay

## 2017-08-11 ENCOUNTER — Other Ambulatory Visit: Payer: Self-pay

## 2017-08-11 ENCOUNTER — Encounter (HOSPITAL_COMMUNITY)
Admission: RE | Admit: 2017-08-11 | Discharge: 2017-08-11 | Disposition: A | Discharge status: Home / Self Care | Payer: PPO | Source: Ambulatory Visit | Attending: Surgery | Admitting: Surgery

## 2017-08-11 DIAGNOSIS — Z79899 Other long term (current) drug therapy: Secondary | ICD-10-CM | POA: Diagnosis not present

## 2017-08-11 DIAGNOSIS — Z87891 Personal history of nicotine dependence: Secondary | ICD-10-CM | POA: Diagnosis not present

## 2017-08-11 DIAGNOSIS — Z0183 Encounter for blood typing: Secondary | ICD-10-CM | POA: Diagnosis not present

## 2017-08-11 DIAGNOSIS — Z01818 Encounter for other preprocedural examination: Secondary | ICD-10-CM | POA: Insufficient documentation

## 2017-08-11 DIAGNOSIS — Z7982 Long term (current) use of aspirin: Secondary | ICD-10-CM | POA: Insufficient documentation

## 2017-08-11 DIAGNOSIS — I712 Thoracic aortic aneurysm, without rupture: Secondary | ICD-10-CM | POA: Insufficient documentation

## 2017-08-11 DIAGNOSIS — Z01812 Encounter for preprocedural laboratory examination: Secondary | ICD-10-CM | POA: Insufficient documentation

## 2017-08-11 DIAGNOSIS — I10 Essential (primary) hypertension: Secondary | ICD-10-CM | POA: Insufficient documentation

## 2017-08-11 HISTORY — DX: Pneumonia, unspecified organism: J18.9

## 2017-08-11 LAB — COMPREHENSIVE METABOLIC PANEL
ALBUMIN: 3.6 g/dL (ref 3.5–5.0)
ALK PHOS: 69 U/L (ref 38–126)
ALT: 16 U/L (ref 14–54)
AST: 20 U/L (ref 15–41)
Anion gap: 9 (ref 5–15)
BILIRUBIN TOTAL: 0.8 mg/dL (ref 0.3–1.2)
BUN: 15 mg/dL (ref 6–20)
CALCIUM: 9.3 mg/dL (ref 8.9–10.3)
CO2: 21 mmol/L — ABNORMAL LOW (ref 22–32)
Chloride: 109 mmol/L (ref 101–111)
Creatinine, Ser: 0.98 mg/dL (ref 0.44–1.00)
GFR calc Af Amer: >60 mL/min (ref >60)
GFR calc non Af Amer: 56 mL/min — ABNORMAL LOW (ref >60)
GLUCOSE: 92 mg/dL (ref 65–99)
Potassium: 4 mmol/L (ref 3.5–5.1)
Sodium: 139 mmol/L (ref 135–145)
TOTAL PROTEIN: 6.8 g/dL (ref 6.5–8.1)

## 2017-08-11 LAB — BLOOD GAS, ARTERIAL
Acid-Base Excess: 0.1 mmol/L (ref 0.0–2.0)
BICARBONATE: 23.9 mmol/L (ref 20.0–28.0)
DRAWN BY: 470591
O2 Saturation: 94 %
PH ART: 7.432 (ref 7.350–7.450)
Patient temperature: 98.6
pCO2 arterial: 36.5 mmHg (ref 32.0–48.0)
pO2, Arterial: 70.3 mmHg — ABNORMAL LOW (ref 83.0–108.0)

## 2017-08-11 LAB — TYPE AND SCREEN
ABO/RH(D): O POS
Antibody Screen: NEGATIVE

## 2017-08-11 LAB — URINALYSIS, ROUTINE W REFLEX MICROSCOPIC
BILIRUBIN URINE: NEGATIVE
Glucose, UA: NEGATIVE mg/dL
Hgb urine dipstick: NEGATIVE
Ketones, ur: NEGATIVE mg/dL
Leukocytes, UA: NEGATIVE
NITRITE: NEGATIVE
PROTEIN: NEGATIVE mg/dL
SPECIFIC GRAVITY, URINE: 1.002 — AB (ref 1.005–1.030)
pH: 7 (ref 5.0–8.0)

## 2017-08-11 LAB — CBC
HEMATOCRIT: 37.7 % (ref 36.0–46.0)
HEMOGLOBIN: 12.2 g/dL (ref 12.0–15.0)
MCH: 26.8 pg (ref 26.0–34.0)
MCHC: 32.4 g/dL (ref 30.0–36.0)
MCV: 82.7 fL (ref 78.0–100.0)
Platelets: 194 10*3/uL (ref 150–400)
RBC: 4.56 MIL/uL (ref 3.87–5.11)
RDW: 15 % (ref 11.5–15.5)
WBC: 5.7 10*3/uL (ref 4.0–10.5)

## 2017-08-11 LAB — PROTIME-INR
INR: 1
Prothrombin Time: 13.1 seconds (ref 11.4–15.2)

## 2017-08-11 LAB — SURGICAL PCR SCREEN
MRSA, PCR: NEGATIVE
Staphylococcus aureus: NEGATIVE

## 2017-08-11 LAB — APTT: aPTT: 33 seconds (ref 24–36)

## 2017-08-11 NOTE — Pre-Procedure Instructions (Signed)
Nancy Montoya  08/11/2017      [id number] [place], [place] [address] [place] [address] Phone: [phone] Fax: [phone]    Your procedure is scheduled on Thursday, August 17, 2017.  Report to [facility] Admitting at 05:30 A.M.  Call this number if you have problems the morning of surgery:  [phone]   Remember:  Do not eat food or drink liquids after midnight.   Take these medicines the morning of surgery with A SIP OF WATER : Metoprolol (Lopressor)                              Today 08/11/17 prior to surgery STOP taking any Aspirin (unless otherwise instructed by your surgeon), Aleve, Naproxen, Ibuprofen, Motrin, Advil, Goody's, BC's, all herbal medications, fish oil, and all vitamins   Do not wear jewelry, make-up or nail polish.  Do not wear lotions, powders, or perfumes, or deodorant.  Do not shave 48 hours prior to surgery.   Do not bring valuables to the hospital.   [facility] is not responsible for any belongings or valuables.  Contacts, dentures or bridgework may not be worn into surgery.  Leave your suitcase in the car.  After surgery it may be brought to your room.  For patients admitted to the hospital, discharge time will be determined by your treatment team.  Patients discharged the day of surgery will not be allowed to drive home.   Special instructions:   [place]- Preparing For Surgery  Before surgery, you can play an important role. Because skin is not sterile, your skin needs to be as free of germs as possible. You can reduce the number of germs on your skin by washing with CHG (chlorahexidine gluconate) Soap before surgery.  CHG is an antiseptic cleaner which kills germs and bonds with the skin to continue killing germs even after washing.  Please do not use if you have an allergy to CHG or antibacterial soaps. If your skin becomes  reddened/irritated stop using the CHG.  Do not shave (including legs and underarms) for at least 48 hours prior to first CHG shower.  Please follow these instructions carefully.   1. Shower the NIGHT BEFORE SURGERY and the MORNING OF SURGERY with CHG.   2. If you chose to wash your hair, wash your hair first as usual with your normal shampoo.  3. After you shampoo, rinse your hair and body thoroughly to remove the shampoo.  4. Use CHG as you would any other liquid soap. You can apply CHG directly to the skin and wash gently with a scrungie or a clean washcloth.   5. Apply the CHG Soap to your body ONLY FROM THE NECK DOWN.  Do not use on open wounds or open sores. Avoid contact with your eyes, ears, mouth and genitals (private parts). Wash Face and genitals (private parts)  with your normal soap.  6. Wash thoroughly, paying special attention to the area where your surgery will be performed.  7. Thoroughly rinse your body with warm water from the neck down.  8. DO NOT shower/wash with your normal soap after using and rinsing off the CHG Soap.  9. Pat yourself dry with a CLEAN TOWEL.  10. Wear CLEAN PAJAMAS to bed the night before surgery, wear comfortable clothes the morning of surgery  11. Place  CLEAN SHEETS on your bed the night of your first shower and DO NOT SLEEP WITH PETS.    Day of Surgery: Do not apply any deodorants/lotions. Please wear clean clothes to the hospital/surgery center.      Please read over the following fact sheets that you were given. Coughing and Deep Breathing, MRSA Information and Surgical Site Infection Prevention

## 2017-08-11 NOTE — Progress Notes (Addendum)
Anesthesia PAT Evaluation: Patient is a 71 year old female scheduled for thoracic aortic endovascular stent graft, left carotid-subclavian artery bypass on 08/17/17 by Dr. Napoleon Form.  History includes ascending thoracic aortic aneurysm (s/p Biologic Bentall Procedure 21-mm Medtronic Freestyle Porcine valve conduit, replacement of ascending aorta and aortic arch with aorto-innominate and aorto-left carotid bypass 10/20/16), former smoker (quit '09), anemia, HTN, right neck lipoma (by U/S 09/06/16).  PCP is Dr. Domenick Gong. Records pending. Cardiologist is Dr. Lauree Chandler. Last visit 07/17/17. CT surgeon is Dr. Ulla Gallo. Last visit 11/23/16.  Meds include ASA 81 mg, Lopressor, olmesartan.   BP (!) 159/64   Pulse (!) 58   Temp 36.6 C   Resp 20   Ht 5\' 4"  (1.626 m)   Wt 149 lb 8 oz (67.8 kg)   SpO2 96%   BMI 25.66 kg/m  Patient is a very pleasant black female in NAD. Well appearing. Heart RRR. She does have a grade II SEM heard loudest right sternal border. Lungs are clear. No conversational dyspnea. She denied chest pain, SOB, edema, syncope, fever. She reports treatment for PNA last month (~ 07/19/17), but feels back to her baseline. Had a productive cough at that time which has since resolved. Was treated with 7 days of antibiotics, but did not require steroids or nebulizer treatments. Her energy level is good. 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  EKG 07/17/17: SR with PACs, LAD, right BBB.   Intra-op TEE 10/20/16: Result status: Final result   Left ventricle: Normal cavity size, wall thickness and left ventricular diastolic function. LV systolic function is mildly reduced with an EF of 45-50%. There are no obvious wall motion abnormalities.  Aortic valve: The valve is trileaflet. Mild valve thickening present. Mildly decreased leaflet separation. Moderate to severe regurgitation. Holodiastolic flow reversal in the  descending thoracic aorta. Spontaneous contrast seen in Desc aorta No AV vegetation.  Mitral valve: No leaflet thickening and calcification present. Trace regurgitation.  Right ventricle: Normal cavity size, wall thickness and ejection fraction.   Post-Op TEE AORTA Aorta unchanged from pre-bypass: Bentall procedure c replaced ascending aorta.  LEFT VENTRICLE Left ventricle unchanged from pre-bypass: Good overall contractility noted, mild LV septal dyssnergy appreciated.  RIGHT VENTRICLE Right ventricle unchanged from pre-bypass: mild diminishment RV function, mildly enlarged  AORTIC VALVE Aortic valve unchanged from pre-bypass: Aortic root and valve homograft in aortic position and functioning normally. MITRAL VALVE Mitral valve unchanged from pre-bypass: mild MR  TRICUSPID VALVE Tricuspid valve unchanged from pre-bypass: moderate TR noted after CPB , central jet . Consider dilated annulus post CPB vs functional TR.   Cardiac cath 10/11/16: 1. No angiographic evidence of CAD 2. Normal filling pressures Recommendations: Continue with planning for thoracic aortic replacement/valve replacement.   Carotid U/S 10/18/16: Summary: Bilateral: intimal wall thickening CCA. 1-39% ICA plaquing. Vertebral artery flow is antegrade.   Neck U/S 09/06/16: IMPRESSION: 1. 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 2. At least 6.1 cm ascending aortic aneurysm, incompletely visualized. Recommend semi-annual imaging followup by CTA or MRA and referral to cardiothoracic surgery if not already obtained.   CTA chest 05/24/17: IMPRESSION: - Expected postoperative changes from replacement of the aortic root, aortic valve and ascending thoracic aorta. Vascular grafts are patent without complicating features. - Fusiform aneurysm of the posterior aortic arch and descending thoracic aorta. Minimal enlargement of the proximal descending thoracic aorta, measuring up to 5.1 cm as described. Negative for an aortic  dissection. - Extensive  collateral vein formation in the upper chest appears to be secondary to a stenosis involving the left innominate vein and probably the lower left internal jugular vein.  Preoperative labs noted. Cr 0.98, glucose 92. CBC, PT/PTT WNL.   Of note, I did hear a murmur on exam which was not documented on previous post-Bentall office visits (that I see). Subsequently, I did send a staff message to Dr. Angelena Form, Dr. Cyndia Bent, and Dr. Trula Slade. She denied any acute CV/CHF symptoms. She remains active. Clinically patient appears recovered from pneumonia. I will follow-up once additional records received.  George Hugh [facility] Short Stay Center/Anesthesiology Phone [phone] 08/11/2017 12:47 PM  Addendum: In response to finding of murmur on exam, Dr. Angelena Form wrote "This is probably the murmur we hear in patients who are post AVR. If she is clinically stable, no need for a repeat echo prior to her vascular procedure."   Records from Dr. Osborne Casco received today. Patient was seen for a complete physical on 08/14/17. CXR repeated and showed near resolution of RLL pneumonia. He felt CXR look ok and was forwarding to Dr. Arita Miss. He is is aware of planned surgery.   CXR 08/14/17 ([facility]): Near total resolution of prior right lung infiltrate. No acute infiltrate, edema, mass or effusion otherwise. Heart size is normal. Marked ectasia and tortuosity of the thoracic aorta. Post sternotomy/cardiac surgery changes. Bony structure are otherwise unremarkable. Impression: Resolving right lung infiltrate. No interval change or acute process otherwise.  If no acute changes then I anticipate that she can proceed as planned.  George Hugh [facility] Short Stay Center/Anesthesiology Phone [phone] 08/16/2017 2:52 PM

## 2017-08-11 NOTE — Pre-Procedure Instructions (Signed)
Cynde Menard  08/11/2017      [id number] [place], [place] [address] [place] [address] Phone: [phone] Fax: [phone]    Your procedure is scheduled on Thursday, August 17, 2017.  Report to [facility] Admitting at 05:30 A.M.  Call this number if you have problems the morning of surgery:  [phone]   Remember:  Do not eat food or drink liquids after midnight.   Take these medicines the morning of surgery with A SIP OF WATER : Metoprolol (Lopressor)  7 days prior to surgery STOP taking any Aspirin (unless otherwise instructed by your surgeon), Aleve, Naproxen, Ibuprofen, Motrin, Advil, Goody's, BC's, all herbal medications, fish oil, and all vitamins   Do not wear jewelry, make-up or nail polish.  Do not wear lotions, powders, or perfumes, or deodorant.  Do not shave 48 hours prior to surgery.   Do not bring valuables to the hospital.   [facility] is not responsible for any belongings or valuables.  Contacts, dentures or bridgework may not be worn into surgery.  Leave your suitcase in the car.  After surgery it may be brought to your room.  For patients admitted to the hospital, discharge time will be determined by your treatment team.  Patients discharged the day of surgery will not be allowed to drive home.   Special instructions:   [place]- Preparing For Surgery  Before surgery, you can play an important role. Because skin is not sterile, your skin needs to be as free of germs as possible. You can reduce the number of germs on your skin by washing with CHG (chlorahexidine gluconate) Soap before surgery.  CHG is an antiseptic cleaner which kills germs and bonds with the skin to continue killing germs even after washing.  Please do not use if you have an allergy to CHG or antibacterial soaps. If your skin becomes reddened/irritated stop using the CHG.  Do not  shave (including legs and underarms) for at least 48 hours prior to first CHG shower.  Please follow these instructions carefully.   1. Shower the NIGHT BEFORE SURGERY and the MORNING OF SURGERY with CHG.   2. If you chose to wash your hair, wash your hair first as usual with your normal shampoo.  3. After you shampoo, rinse your hair and body thoroughly to remove the shampoo.  4. Use CHG as you would any other liquid soap. You can apply CHG directly to the skin and wash gently with a scrungie or a clean washcloth.   5. Apply the CHG Soap to your body ONLY FROM THE NECK DOWN.  Do not use on open wounds or open sores. Avoid contact with your eyes, ears, mouth and genitals (private parts). Wash Face and genitals (private parts)  with your normal soap.  6. Wash thoroughly, paying special attention to the area where your surgery will be performed.  7. Thoroughly rinse your body with warm water from the neck down.  8. DO NOT shower/wash with your normal soap after using and rinsing off the CHG Soap.  9. Pat yourself dry with a CLEAN TOWEL.  10. Wear CLEAN PAJAMAS to bed the night before surgery, wear comfortable clothes the morning of surgery  11. Place CLEAN SHEETS on your bed the night of your first shower and DO NOT SLEEP WITH PETS.    Day of Surgery: Do not apply any deodorants/lotions.  Please wear clean clothes to the hospital/surgery center.      Please read over the following fact sheets that you were given. Coughing and Deep Breathing, MRSA Information and Surgical Site Infection Prevention

## 2017-08-14 DIAGNOSIS — Z1389 Encounter for screening for other disorder: Secondary | ICD-10-CM | POA: Diagnosis not present

## 2017-08-14 DIAGNOSIS — J189 Pneumonia, unspecified organism: Secondary | ICD-10-CM | POA: Diagnosis not present

## 2017-08-14 DIAGNOSIS — Z6825 Body mass index (BMI) 25.0-25.9, adult: Secondary | ICD-10-CM | POA: Diagnosis not present

## 2017-08-14 DIAGNOSIS — M1990 Unspecified osteoarthritis, unspecified site: Secondary | ICD-10-CM | POA: Diagnosis not present

## 2017-08-14 DIAGNOSIS — E559 Vitamin D deficiency, unspecified: Secondary | ICD-10-CM | POA: Diagnosis not present

## 2017-08-14 DIAGNOSIS — I10 Essential (primary) hypertension: Secondary | ICD-10-CM | POA: Diagnosis not present

## 2017-08-14 DIAGNOSIS — I712 Thoracic aortic aneurysm, without rupture: Secondary | ICD-10-CM | POA: Diagnosis not present

## 2017-08-14 DIAGNOSIS — D692 Other nonthrombocytopenic purpura: Secondary | ICD-10-CM | POA: Diagnosis not present

## 2017-08-14 DIAGNOSIS — Z0000 Encounter for general adult medical examination without abnormal findings: Secondary | ICD-10-CM | POA: Diagnosis not present

## 2017-08-14 DIAGNOSIS — D126 Benign neoplasm of colon, unspecified: Secondary | ICD-10-CM | POA: Diagnosis not present

## 2017-08-14 DIAGNOSIS — M859 Disorder of bone density and structure, unspecified: Secondary | ICD-10-CM | POA: Diagnosis not present

## 2017-08-14 DIAGNOSIS — Z952 Presence of prosthetic heart valve: Secondary | ICD-10-CM | POA: Diagnosis not present

## 2017-08-17 ENCOUNTER — Encounter (HOSPITAL_COMMUNITY)
Admission: RE | Disposition: A | Discharge status: Home / Self Care | Payer: Self-pay | Source: Ambulatory Visit | Attending: Surgery

## 2017-08-17 ENCOUNTER — Inpatient Hospital Stay (HOSPITAL_COMMUNITY): Payer: PPO | Admitting: Vascular Surgery

## 2017-08-17 ENCOUNTER — Inpatient Hospital Stay (HOSPITAL_COMMUNITY)
Admission: RE | Admit: 2017-08-17 | Discharge: 2017-08-18 | DRG: 221 | Disposition: A | Discharge status: Home / Self Care | Payer: PPO | Source: Ambulatory Visit | Attending: Surgery | Admitting: Surgery

## 2017-08-17 ENCOUNTER — Inpatient Hospital Stay (HOSPITAL_COMMUNITY): Payer: PPO

## 2017-08-17 ENCOUNTER — Other Ambulatory Visit: Payer: Self-pay

## 2017-08-17 ENCOUNTER — Encounter (HOSPITAL_COMMUNITY): Payer: Self-pay

## 2017-08-17 ENCOUNTER — Inpatient Hospital Stay (HOSPITAL_COMMUNITY): Payer: PPO | Admitting: Certified Registered"

## 2017-08-17 DIAGNOSIS — I712 Thoracic aortic aneurysm, without rupture, unspecified: Secondary | ICD-10-CM | POA: Diagnosis present

## 2017-08-17 DIAGNOSIS — J90 Pleural effusion, not elsewhere classified: Secondary | ICD-10-CM | POA: Diagnosis not present

## 2017-08-17 DIAGNOSIS — Z87891 Personal history of nicotine dependence: Secondary | ICD-10-CM | POA: Diagnosis not present

## 2017-08-17 DIAGNOSIS — Z1212 Encounter for screening for malignant neoplasm of rectum: Secondary | ICD-10-CM | POA: Diagnosis not present

## 2017-08-17 DIAGNOSIS — J189 Pneumonia, unspecified organism: Secondary | ICD-10-CM | POA: Diagnosis not present

## 2017-08-17 DIAGNOSIS — I739 Peripheral vascular disease, unspecified: Secondary | ICD-10-CM | POA: Diagnosis not present

## 2017-08-17 DIAGNOSIS — Z7982 Long term (current) use of aspirin: Secondary | ICD-10-CM

## 2017-08-17 DIAGNOSIS — Z91040 Latex allergy status: Secondary | ICD-10-CM | POA: Diagnosis not present

## 2017-08-17 DIAGNOSIS — I10 Essential (primary) hypertension: Secondary | ICD-10-CM | POA: Diagnosis not present

## 2017-08-17 HISTORY — PX: THORACIC AORTIC ENDOVASCULAR STENT GRAFT: SHX6112

## 2017-08-17 HISTORY — PX: CAROTID-SUBCLAVIAN BYPASS GRAFT: SHX910

## 2017-08-17 LAB — BASIC METABOLIC PANEL
Anion gap: 6 (ref 5–15)
BUN: 13 mg/dL (ref 6–20)
CALCIUM: 8.6 mg/dL — AB (ref 8.9–10.3)
CO2: 24 mmol/L (ref 22–32)
Chloride: 108 mmol/L (ref 101–111)
Creatinine, Ser: 0.92 mg/dL (ref 0.44–1.00)
GFR calc non Af Amer: >60 mL/min (ref >60)
Glucose, Bld: 152 mg/dL — ABNORMAL HIGH (ref 65–99)
Potassium: 4.9 mmol/L (ref 3.5–5.1)
SODIUM: 138 mmol/L (ref 135–145)

## 2017-08-17 LAB — CBC
HEMATOCRIT: 35.1 % — AB (ref 36.0–46.0)
Hemoglobin: 11.4 g/dL — ABNORMAL LOW (ref 12.0–15.0)
MCH: 27.1 pg (ref 26.0–34.0)
MCHC: 32.5 g/dL (ref 30.0–36.0)
MCV: 83.4 fL (ref 78.0–100.0)
Platelets: 162 10*3/uL (ref 150–400)
RBC: 4.21 MIL/uL (ref 3.87–5.11)
RDW: 15.5 % (ref 11.5–15.5)
WBC: 9 10*3/uL (ref 4.0–10.5)

## 2017-08-17 LAB — POCT ACTIVATED CLOTTING TIME
Activated Clotting Time: 241 seconds
Activated Clotting Time: 224 seconds
Activated Clotting Time: 208 seconds

## 2017-08-17 LAB — APTT: APTT: 39 s — AB (ref 24–36)

## 2017-08-17 LAB — MAGNESIUM: MAGNESIUM: 1.6 mg/dL — AB (ref 1.7–2.4)

## 2017-08-17 LAB — PROTIME-INR
INR: 1.07
Prothrombin Time: 13.8 seconds (ref 11.4–15.2)

## 2017-08-17 SURGERY — INSERTION, ENDOVASCULAR STENT GRAFT, AORTA, THORACIC
Anesthesia: General | Site: Chest

## 2017-08-17 MED ORDER — OXYCODONE HCL 5 MG PO TABS
5.0000 mg | ORAL_TABLET | ORAL | Status: DC | PRN
  Administered 2017-08-17: 10 mg via ORAL
  Administered 2017-08-18: 5 mg via ORAL
  Filled 2017-08-17: qty 1

## 2017-08-17 MED ORDER — SODIUM CHLORIDE 0.9 % IV SOLN
500.0000 mL | Freq: Once | INTRAVENOUS | Status: DC | PRN
Start: 2017-08-17 — End: 2017-08-18

## 2017-08-17 MED ORDER — ALUM & MAG HYDROXIDE-SIMETH 200-200-20 MG/5ML PO SUSP: 15.0000 mL | ORAL | Status: DC | PRN

## 2017-08-17 MED ORDER — 0.9 % SODIUM CHLORIDE (POUR BTL) OPTIME
TOPICAL | Status: DC | PRN
  Administered 2017-08-17: 2000 mL

## 2017-08-17 MED ORDER — FENTANYL CITRATE (PF) 250 MCG/5ML IJ SOLN
INTRAMUSCULAR | Status: AC
  Filled 2017-08-17: qty 5

## 2017-08-17 MED ORDER — HYDRALAZINE HCL 20 MG/ML IJ SOLN: 5.0000 mg | INTRAMUSCULAR | Status: DC | PRN

## 2017-08-17 MED ORDER — METOCLOPRAMIDE HCL 5 MG/ML IJ SOLN
INTRAMUSCULAR | Status: AC
  Filled 2017-08-17: qty 2

## 2017-08-17 MED ORDER — METOPROLOL TARTRATE 50 MG PO TABS
50.0000 mg | ORAL_TABLET | Freq: Two times a day (BID) | ORAL | Status: DC
  Administered 2017-08-17 – 2017-08-18 (×2): 50 mg via ORAL
  Filled 2017-08-17 (×2): qty 1

## 2017-08-17 MED ORDER — ESMOLOL HCL 100 MG/10ML IV SOLN
INTRAVENOUS | Status: DC | PRN
  Administered 2017-08-17: 20 mg via INTRAVENOUS
  Administered 2017-08-17: 40 mg via INTRAVENOUS

## 2017-08-17 MED ORDER — OXYCODONE HCL 5 MG PO TABS
ORAL_TABLET | ORAL | Status: AC
  Filled 2017-08-17: qty 2

## 2017-08-17 MED ORDER — LABETALOL HCL 5 MG/ML IV SOLN: 10.0000 mg | INTRAVENOUS | Status: DC | PRN

## 2017-08-17 MED ORDER — HEMOSTATIC AGENTS (NO CHARGE) OPTIME
TOPICAL | Status: DC | PRN
  Administered 2017-08-17 (×2): 1 via TOPICAL

## 2017-08-17 MED ORDER — SODIUM CHLORIDE 0.9 % IV SOLN
INTRAVENOUS | Status: DC | PRN
  Administered 2017-08-17: 09:00:00

## 2017-08-17 MED ORDER — LACTATED RINGERS IV SOLN
INTRAVENOUS | Status: DC | PRN
  Administered 2017-08-17: 07:00:00 via INTRAVENOUS

## 2017-08-17 MED ORDER — ROCURONIUM BROMIDE 10 MG/ML (PF) SYRINGE
PREFILLED_SYRINGE | INTRAVENOUS | Status: DC | PRN
Start: 2017-08-17 — End: 2017-08-17
  Administered 2017-08-17: 20 mg via INTRAVENOUS
  Administered 2017-08-17: 10 mg via INTRAVENOUS
  Administered 2017-08-17: 50 mg via INTRAVENOUS
  Administered 2017-08-17: 10 mg via INTRAVENOUS

## 2017-08-17 MED ORDER — CHLORHEXIDINE GLUCONATE 4 % EX LIQD: 60.0000 mL | Freq: Once | CUTANEOUS | Status: DC

## 2017-08-17 MED ORDER — DEXTROSE 5 % IV SOLN
1.5000 g | INTRAVENOUS | Status: AC
  Administered 2017-08-17: 1.5 g via INTRAVENOUS
  Filled 2017-08-17: qty 1.5

## 2017-08-17 MED ORDER — ACETAMINOPHEN 325 MG PO TABS: 325.0000 mg | ORAL_TABLET | ORAL | Status: DC | PRN

## 2017-08-17 MED ORDER — MORPHINE SULFATE (PF) 4 MG/ML IV SOLN
INTRAVENOUS | Status: AC
  Administered 2017-08-17: 4 mg
  Filled 2017-08-17: qty 1

## 2017-08-17 MED ORDER — ONDANSETRON HCL 4 MG/2ML IJ SOLN
INTRAMUSCULAR | Status: AC
  Filled 2017-08-17: qty 2

## 2017-08-17 MED ORDER — FENTANYL CITRATE (PF) 100 MCG/2ML IJ SOLN
25.0000 ug | INTRAMUSCULAR | Status: DC | PRN
  Administered 2017-08-17 (×2): 25 ug via INTRAVENOUS
  Administered 2017-08-17: 50 ug via INTRAVENOUS

## 2017-08-17 MED ORDER — LIDOCAINE 2% (20 MG/ML) 5 ML SYRINGE
INTRAMUSCULAR | Status: DC | PRN
  Administered 2017-08-17: 80 mg via INTRAVENOUS

## 2017-08-17 MED ORDER — GUAIFENESIN-DM 100-10 MG/5ML PO SYRP: 15.0000 mL | ORAL_SOLUTION | ORAL | Status: DC | PRN

## 2017-08-17 MED ORDER — METOCLOPRAMIDE HCL 5 MG/ML IJ SOLN
10.0000 mg | Freq: Four times a day (QID) | INTRAMUSCULAR | Status: AC
  Administered 2017-08-18: 10 mg via INTRAVENOUS
  Filled 2017-08-17: qty 2

## 2017-08-17 MED ORDER — CEFUROXIME SODIUM 1.5 G IV SOLR
1.5000 g | Freq: Two times a day (BID) | INTRAVENOUS | Status: AC
  Administered 2017-08-17 – 2017-08-18 (×2): 1.5 g via INTRAVENOUS
  Filled 2017-08-17 (×2): qty 1.5

## 2017-08-17 MED ORDER — ONDANSETRON HCL 4 MG/2ML IJ SOLN
INTRAMUSCULAR | Status: DC | PRN
  Administered 2017-08-17: 4 mg via INTRAVENOUS

## 2017-08-17 MED ORDER — METOPROLOL TARTRATE 5 MG/5ML IV SOLN: 2.0000 mg | INTRAVENOUS | Status: DC | PRN

## 2017-08-17 MED ORDER — MIDAZOLAM HCL 5 MG/5ML IJ SOLN
INTRAMUSCULAR | Status: DC | PRN
  Administered 2017-08-17: 1 mg via INTRAVENOUS

## 2017-08-17 MED ORDER — ACETAMINOPHEN 325 MG RE SUPP
325.0000 mg | RECTAL | Status: DC | PRN
  Filled 2017-08-17: qty 2

## 2017-08-17 MED ORDER — MIDAZOLAM HCL 2 MG/2ML IJ SOLN
INTRAMUSCULAR | Status: AC
  Filled 2017-08-17: qty 2

## 2017-08-17 MED ORDER — HEPARIN SODIUM (PORCINE) 1000 UNIT/ML IJ SOLN
INTRAMUSCULAR | Status: DC | PRN
  Administered 2017-08-17 (×3): 1000 [IU] via INTRAVENOUS
  Administered 2017-08-17: 7000 [IU] via INTRAVENOUS

## 2017-08-17 MED ORDER — PROTAMINE SULFATE 10 MG/ML IV SOLN
INTRAVENOUS | Status: DC | PRN
  Administered 2017-08-17: 50 mg via INTRAVENOUS

## 2017-08-17 MED ORDER — ASPIRIN EC 81 MG PO TBEC
81.0000 mg | DELAYED_RELEASE_TABLET | Freq: Every day | ORAL | Status: DC
  Administered 2017-08-18: 81 mg via ORAL
  Filled 2017-08-17: qty 1

## 2017-08-17 MED ORDER — LIDOCAINE HCL (PF) 1 % IJ SOLN
INTRAMUSCULAR | Status: AC
  Filled 2017-08-17: qty 30

## 2017-08-17 MED ORDER — PROPOFOL 10 MG/ML IV BOLUS
INTRAVENOUS | Status: DC | PRN
  Administered 2017-08-17: 100 mg via INTRAVENOUS
  Administered 2017-08-17: 50 mg via INTRAVENOUS

## 2017-08-17 MED ORDER — ONDANSETRON HCL 4 MG/2ML IJ SOLN
4.0000 mg | Freq: Four times a day (QID) | INTRAMUSCULAR | Status: DC | PRN
  Administered 2017-08-17: 4 mg via INTRAVENOUS
  Filled 2017-08-17: qty 2

## 2017-08-17 MED ORDER — POTASSIUM CHLORIDE CRYS ER 20 MEQ PO TBCR: 20.0000 meq | EXTENDED_RELEASE_TABLET | Freq: Every day | ORAL | Status: DC | PRN

## 2017-08-17 MED ORDER — FENTANYL CITRATE (PF) 100 MCG/2ML IJ SOLN
INTRAMUSCULAR | Status: AC
  Administered 2017-08-17: 25 ug via INTRAVENOUS
  Filled 2017-08-17: qty 2

## 2017-08-17 MED ORDER — SODIUM CHLORIDE 0.9 % IV SOLN: INTRAVENOUS | Status: DC

## 2017-08-17 MED ORDER — LACTATED RINGERS IV SOLN
INTRAVENOUS | Status: DC | PRN
  Administered 2017-08-17 (×2): via INTRAVENOUS

## 2017-08-17 MED ORDER — PHENOL 1.4 % MT LIQD: 1.0000 | OROMUCOSAL | Status: DC | PRN

## 2017-08-17 MED ORDER — IRBESARTAN 75 MG PO TABS
37.5000 mg | ORAL_TABLET | Freq: Every day | ORAL | Status: DC
  Administered 2017-08-17 – 2017-08-18 (×2): 37.5 mg via ORAL
  Filled 2017-08-17 (×2): qty 0.5

## 2017-08-17 MED ORDER — SUGAMMADEX SODIUM 200 MG/2ML IV SOLN
INTRAVENOUS | Status: DC | PRN
  Administered 2017-08-17: 140 mg via INTRAVENOUS

## 2017-08-17 MED ORDER — MAGNESIUM SULFATE 2 GM/50ML IV SOLN
2.0000 g | Freq: Every day | INTRAVENOUS | Status: DC | PRN
  Filled 2017-08-17 (×2): qty 50

## 2017-08-17 MED ORDER — PHENYLEPHRINE HCL 10 MG/ML IJ SOLN
INTRAVENOUS | Status: DC | PRN
  Administered 2017-08-17: 20 ug/min via INTRAVENOUS

## 2017-08-17 MED ORDER — DEXAMETHASONE SODIUM PHOSPHATE 10 MG/ML IJ SOLN
INTRAMUSCULAR | Status: DC | PRN
  Administered 2017-08-17: 10 mg via INTRAVENOUS

## 2017-08-17 MED ORDER — DOCUSATE SODIUM 100 MG PO CAPS
100.0000 mg | ORAL_CAPSULE | Freq: Every day | ORAL | Status: DC
  Filled 2017-08-17: qty 1

## 2017-08-17 MED ORDER — ONDANSETRON HCL 4 MG/2ML IJ SOLN
4.0000 mg | Freq: Four times a day (QID) | INTRAMUSCULAR | Status: DC | PRN
  Administered 2017-08-17: 4 mg via INTRAVENOUS

## 2017-08-17 MED ORDER — MORPHINE SULFATE (PF) 2 MG/ML IV SOLN: 2.0000 mg | INTRAVENOUS | Status: DC | PRN

## 2017-08-17 MED ORDER — FENTANYL CITRATE (PF) 100 MCG/2ML IJ SOLN
INTRAMUSCULAR | Status: DC | PRN
  Administered 2017-08-17 (×5): 50 ug via INTRAVENOUS
  Administered 2017-08-17 (×3): 25 ug via INTRAVENOUS
  Administered 2017-08-17: 50 ug via INTRAVENOUS

## 2017-08-17 MED ORDER — PANTOPRAZOLE SODIUM 40 MG PO TBEC
40.0000 mg | DELAYED_RELEASE_TABLET | Freq: Every day | ORAL | Status: DC
  Filled 2017-08-17: qty 1

## 2017-08-17 MED ORDER — IODIXANOL 320 MG/ML IV SOLN
INTRAVENOUS | Status: DC | PRN
  Administered 2017-08-17: 150 mL via INTRAVENOUS

## 2017-08-17 MED ORDER — PROPOFOL 10 MG/ML IV BOLUS
INTRAVENOUS | Status: AC
  Filled 2017-08-17: qty 20

## 2017-08-17 SURGICAL SUPPLY — 95 items
ADH SKN CLS APL DERMABOND .7 (GAUZE/BANDAGES/DRESSINGS) ×4
BAG DECANTER FOR FLEXI CONT (MISCELLANEOUS) ×4 IMPLANT
BAG SNAP BAND KOVER 36X36 (MISCELLANEOUS) ×4 IMPLANT
CANISTER SUCT 3000ML PPV (MISCELLANEOUS) ×4 IMPLANT
CATH ACCU-VU SIZ PIG 5F 100CM (CATHETERS) ×2 IMPLANT
CATH BALLN TRILOBE 26-42 (BALLOONS) ×2 IMPLANT
CATH ROBINSON RED A/P 18FR (CATHETERS) ×2 IMPLANT
CATH SUCT 10FR WHISTLE TIP (CATHETERS) ×2 IMPLANT
CLIP VESOCCLUDE MED 24/CT (CLIP) ×4 IMPLANT
CLIP VESOCCLUDE SM WIDE 24/CT (CLIP) ×4 IMPLANT
COVER PROBE W GEL 5X96 (DRAPES) ×4 IMPLANT
CRADLE DONUT ADULT HEAD (MISCELLANEOUS) ×4 IMPLANT
DERMABOND ADVANCED (GAUZE/BANDAGES/DRESSINGS) ×4
DERMABOND ADVANCED .7 DNX12 (GAUZE/BANDAGES/DRESSINGS) ×2 IMPLANT
DEVICE CLOSURE PERCLS PRGLD 6F (VASCULAR PRODUCTS) IMPLANT
DRAIN CHANNEL 15F RND FF W/TCR (WOUND CARE) IMPLANT
DRAPE ZERO GRAVITY STERILE (DRAPES) ×2 IMPLANT
DRSG TEGADERM 2-3/8X2-3/4 SM (GAUZE/BANDAGES/DRESSINGS) ×4 IMPLANT
DRYSEAL FLEXSHEATH 24FR 33CM (SHEATH) ×2
ELECT CAUTERY BLADE 6.4 (BLADE) ×4 IMPLANT
ELECT REM PT RETURN 9FT ADLT (ELECTROSURGICAL) ×8
ELECTRODE REM PT RTRN 9FT ADLT (ELECTROSURGICAL) ×4 IMPLANT
ENDOPROSTHESIS THORAC 34X34X20 (Endovascular Graft) IMPLANT
ENDOPROSTHESIS THORAC 40X40X15 (Endovascular Graft) IMPLANT
ENDOPROSTHESIS THORAC 40X40X20 (Endovascular Graft) IMPLANT
ENDOPROTH THORACIC 34X34X20 (Endovascular Graft) ×4 IMPLANT
ENDOPROTH THORACIC 40X40X15 (Endovascular Graft) ×4 IMPLANT
ENDOPROTH THORACIC 40X40X20 (Endovascular Graft) ×4 IMPLANT
EVACUATOR SILICONE 100CC (DRAIN) IMPLANT
FELT TEFLON 1X6 (MISCELLANEOUS) ×2 IMPLANT
GAUZE SPONGE 4X4 12PLY STRL (GAUZE/BANDAGES/DRESSINGS) ×2 IMPLANT
GLOVE BIOGEL PI IND STRL 6.5 (GLOVE) IMPLANT
GLOVE BIOGEL PI IND STRL 7.0 (GLOVE) IMPLANT
GLOVE BIOGEL PI IND STRL 7.5 (GLOVE) ×2 IMPLANT
GLOVE BIOGEL PI INDICATOR 6.5 (GLOVE) ×10
GLOVE BIOGEL PI INDICATOR 7.0 (GLOVE) ×2
GLOVE BIOGEL PI INDICATOR 7.5 (GLOVE) ×4
GLOVE SURG SS PI 6.5 STRL IVOR (GLOVE) ×2 IMPLANT
GLOVE SURG SS PI 7.0 STRL IVOR (GLOVE) ×6 IMPLANT
GLOVE SURG SS PI 7.5 STRL IVOR (GLOVE) ×6 IMPLANT
GLOVE SURG SS PI 8.5 STRL IVOR (GLOVE) ×2
GLOVE SURG SS PI 8.5 STRL STRW (GLOVE) IMPLANT
GOWN STRL REUS W/ TWL LRG LVL3 (GOWN DISPOSABLE) ×4 IMPLANT
GOWN STRL REUS W/ TWL XL LVL3 (GOWN DISPOSABLE) ×2 IMPLANT
GOWN STRL REUS W/TWL LRG LVL3 (GOWN DISPOSABLE) ×4
GOWN STRL REUS W/TWL XL LVL3 (GOWN DISPOSABLE) ×16
GRAFT BALLN CATH 65CM (STENTS) IMPLANT
GRAFT CV STRG 30X7KNIT SFT (Vascular Products) IMPLANT
GRAFT HEMASHIELD 7MM (Vascular Products) ×4 IMPLANT
GRAFT HEMASHIELD 8MM (Vascular Products) IMPLANT
GRAFT VASC STRG 30X8KNIT (Vascular Products) IMPLANT
HEMOSTAT SNOW SURGICEL 2X4 (HEMOSTASIS) IMPLANT
KIT BASIN OR (CUSTOM PROCEDURE TRAY) ×4 IMPLANT
KIT ROOM TURNOVER OR (KITS) ×4 IMPLANT
LOOP VESSEL MAXI BLUE (MISCELLANEOUS) ×2 IMPLANT
LOOP VESSEL MINI RED (MISCELLANEOUS) ×2 IMPLANT
NDL PERC 18GX7CM (NEEDLE) ×2 IMPLANT
NEEDLE PERC 18GX7CM (NEEDLE) ×4 IMPLANT
NS IRRIG 1000ML POUR BTL (IV SOLUTION) ×8 IMPLANT
PACK CAROTID (CUSTOM PROCEDURE TRAY) ×2 IMPLANT
PACK ENDOVASCULAR (PACKS) ×4 IMPLANT
PAD ARMBOARD 7.5X6 YLW CONV (MISCELLANEOUS) ×8 IMPLANT
PENCIL BUTTON HOLSTER BLD 10FT (ELECTRODE) ×4 IMPLANT
PERCLOSE PROGLIDE 6F (VASCULAR PRODUCTS) ×8
PUNCH AORTIC ROTATE 5MM 8IN (MISCELLANEOUS) ×2 IMPLANT
SHEATH AVANTI 11CM 8FR (MISCELLANEOUS) IMPLANT
SHEATH DRYSEAL FLEX 24FR 33CM (SHEATH) IMPLANT
SHEATH PINNACLE 8F 10CM (SHEATH) ×2 IMPLANT
SHIELD RADPAD SCOOP 12X17 (MISCELLANEOUS) ×6 IMPLANT
SHUNT CAROTID BYPASS 10 (VASCULAR PRODUCTS) IMPLANT
SHUNT CAROTID BYPASS 12 (VASCULAR PRODUCTS) IMPLANT
STENT GRAFT BALLN CATH 65CM (STENTS)
STOPCOCK MORSE 400PSI 3WAY (MISCELLANEOUS) ×4 IMPLANT
SUT ETHILON 3 0 PS 1 (SUTURE) IMPLANT
SUT PROLENE 5 0 C 1 24 (SUTURE) ×16 IMPLANT
SUT PROLENE 6 0 BV (SUTURE) ×6 IMPLANT
SUT PROLENE 7 0 BV 1 (SUTURE) IMPLANT
SUT SILK 3 0 (SUTURE) ×8
SUT SILK 3 0 SH CR/8 (SUTURE) ×2 IMPLANT
SUT SILK 3-0 18XBRD TIE 12 (SUTURE) IMPLANT
SUT SILK 4 0 (SUTURE) ×4
SUT SILK 4-0 18XBRD TIE 12 (SUTURE) IMPLANT
SUT VIC AB 2-0 CT1 27 (SUTURE)
SUT VIC AB 2-0 CT1 TAPERPNT 27 (SUTURE) IMPLANT
SUT VIC AB 3-0 SH 27 (SUTURE) ×12
SUT VIC AB 3-0 SH 27X BRD (SUTURE) ×2 IMPLANT
SUT VICRYL 4-0 PS2 18IN ABS (SUTURE) ×4 IMPLANT
SYR 30ML LL (SYRINGE) IMPLANT
SYR 50ML LL SCALE MARK (SYRINGE) ×4 IMPLANT
SYR MEDRAD MARK V 150ML (SYRINGE) ×2 IMPLANT
TRAY FOLEY W/METER SILVER 16FR (SET/KITS/TRAYS/PACK) ×4 IMPLANT
TUBING HIGH PRESSURE 120CM (CONNECTOR) ×4 IMPLANT
WATER STERILE IRR 1000ML POUR (IV SOLUTION) ×4 IMPLANT
WIRE BENTSON .035X145CM (WIRE) ×4 IMPLANT
WIRE STIFF LUNDERQUIST 260CM (WIRE) ×2 IMPLANT

## 2017-08-17 NOTE — Anesthesia Postprocedure Evaluation (Signed)
Anesthesia Post Note  Patient: Nancy Montoya  Procedure(s) Performed: THORACIC AORTIC ENDOVASCULAR STENT GRAFT (N/A Chest) BYPASS HEMASHIELD GOLD GRAFT 13mm x 30cm CAROTID-SUBCLAVIAN (Left Chest)     Patient location during evaluation: PACU Anesthesia Type: General Level of consciousness: awake Pain management: pain level controlled Vital Signs Assessment: post-procedure vital signs reviewed and stable Respiratory status: spontaneous breathing Cardiovascular status: stable Anesthetic complications: no    Last Vitals:  Vitals:   08/17/17 1230 08/17/17 1245  BP: (!) 141/98 126/72  Pulse: 75 76  Resp: 17 16  Temp:    SpO2: 97% 96%    Last Pain:  Vitals:   08/17/17 1330  TempSrc:   PainSc: 7                  Zacarias Krauter

## 2017-08-17 NOTE — Anesthesia Preprocedure Evaluation (Addendum)
Anesthesia Evaluation  Patient identified by MRN, date of birth, ID band Patient awake    Reviewed: Allergy & Precautions, NPO status , Patient's Chart, lab work & pertinent test results  Airway Mallampati: II  TM Distance: >3 FB     Dental   Pulmonary pneumonia, former smoker,    breath sounds clear to auscultation       Cardiovascular hypertension, + Peripheral Vascular Disease   Rhythm:Regular Rate:Normal     Neuro/Psych    GI/Hepatic negative GI ROS, Neg liver ROS,   Endo/Other  negative endocrine ROS  Renal/GU negative Renal ROS     Musculoskeletal   Abdominal   Peds  Hematology   Anesthesia Other Findings   Reproductive/Obstetrics                             Anesthesia Physical Anesthesia Plan  ASA: III  Anesthesia Plan: General   Post-op Pain Management:    Induction: Intravenous  PONV Risk Score and Plan: 3 and Ondansetron, Dexamethasone and Midazolam  Airway Management Planned: Oral ETT  Additional Equipment:   Intra-op Plan:   Post-operative Plan: Possible Post-op intubation/ventilation  Informed Consent: I have reviewed the patients History and Physical, chart, labs and discussed the procedure including the risks, benefits and alternatives for the proposed anesthesia with the patient or authorized representative who has indicated his/her understanding and acceptance.     Plan Discussed with: CRNA and Anesthesiologist  Anesthesia Plan Comments:        Anesthesia Quick Evaluation

## 2017-08-17 NOTE — Anesthesia Procedure Notes (Signed)
Arterial Line Insertion Start/End11/15/2018 7:00 AM, 08/17/2017 7:10 AM Performed by: Imagene Riches, CRNA, CRNA  Patient location: Pre-op. Preanesthetic checklist: patient identified, IV checked, site marked, risks and benefits discussed, surgical consent, monitors and equipment checked, pre-op evaluation and anesthesia consent Right, radial was placed Catheter size: 20 G Hand hygiene performed  and maximum sterile barriers used  Allen's test indicative of satisfactory collateral circulation Attempts: 1 Procedure performed without using ultrasound guided technique. Following insertion, dressing applied and Biopatch. Post procedure assessment: normal  Patient tolerated the procedure well with no immediate complications.

## 2017-08-17 NOTE — Transfer of Care (Signed)
Immediate Anesthesia Transfer of Care Note  Patient: Nancy Montoya  Procedure(s) Performed: THORACIC AORTIC ENDOVASCULAR STENT GRAFT (N/A Chest) BYPASS HEMASHIELD GOLD GRAFT 58mm x 30cm CAROTID-SUBCLAVIAN (Left Chest)  Patient Location: PACU  Anesthesia Type:General  Level of Consciousness: drowsy and patient cooperative  Airway & Oxygen Therapy: Patient Spontanous Breathing and Patient connected to nasal cannula oxygen  Post-op Assessment: Report given to RN and Post -op Vital signs reviewed and stable  Post vital signs: Reviewed and stable  Last Vitals:  Vitals:   08/17/17 0554 08/17/17 0604  BP: (!) 194/78 (!) 173/81  Pulse: 62 (!) 58  Resp: 20   Temp: 36.6 C   SpO2: 98%     Last Pain:  Vitals:   08/17/17 0554  TempSrc: Oral      Patients Stated Pain Goal: 3 (40/10/27 2536)  Complications: No apparent anesthesia complications

## 2017-08-17 NOTE — Progress Notes (Signed)
Report given to jamie hart rn as caregiver 

## 2017-08-17 NOTE — Interval H&P Note (Signed)
History and Physical Interval Note:  08/17/2017 7:30 AM  Nancy Montoya  has presented today for surgery, with the diagnosis of Thoracic Aortic Aneurysm   171.2  The various methods of treatment have been discussed with the patient and family. After consideration of risks, benefits and other options for treatment, the patient has consented to  Procedure(s): THORACIC AORTIC ENDOVASCULAR STENT GRAFT (N/A) BYPASS GRAFT CAROTID-SUBCLAVIAN LEFT (Left) as a surgical intervention .  The patient's history has been reviewed, patient examined, no change in status, stable for surgery.  I have reviewed the patient's chart and labs.  Questions were answered to the patient's satisfaction.     Annamarie Major

## 2017-08-17 NOTE — Anesthesia Procedure Notes (Signed)
Procedure Name: Intubation Date/Time: 08/17/2017 7:46 AM Performed by: Imagene Riches, CRNA Pre-anesthesia Checklist: Patient identified, Emergency Drugs available, Suction available and Patient being monitored Patient Re-evaluated:Patient Re-evaluated prior to induction Oxygen Delivery Method: Circle System Utilized Preoxygenation: Pre-oxygenation with 100% oxygen Induction Type: IV induction Ventilation: Mask ventilation without difficulty Laryngoscope Size: Miller and 2 Grade View: Grade I Tube type: Oral Tube size: 7.0 mm Number of attempts: 1 Airway Equipment and Method: Stylet and Oral airway Placement Confirmation: ETT inserted through vocal cords under direct vision,  positive ETCO2 and breath sounds checked- equal and bilateral Secured at: 23 cm Tube secured with: Tape Dental Injury: Teeth and Oropharynx as per pre-operative assessment

## 2017-08-18 ENCOUNTER — Encounter (HOSPITAL_COMMUNITY): Payer: Self-pay | Admitting: Surgery

## 2017-08-18 LAB — CBC
HCT: 33.4 % — ABNORMAL LOW (ref 36.0–46.0)
Hemoglobin: 10.6 g/dL — ABNORMAL LOW (ref 12.0–15.0)
MCH: 26.4 pg (ref 26.0–34.0)
MCHC: 31.7 g/dL (ref 30.0–36.0)
MCV: 83.1 fL (ref 78.0–100.0)
PLATELETS: 145 10*3/uL — AB (ref 150–400)
RBC: 4.02 MIL/uL (ref 3.87–5.11)
RDW: 15.2 % (ref 11.5–15.5)
WBC: 11 10*3/uL — ABNORMAL HIGH (ref 4.0–10.5)

## 2017-08-18 LAB — BASIC METABOLIC PANEL
Anion gap: 5 (ref 5–15)
BUN: 12 mg/dL (ref 6–20)
CO2: 25 mmol/L (ref 22–32)
CREATININE: 0.82 mg/dL (ref 0.44–1.00)
Calcium: 8.7 mg/dL — ABNORMAL LOW (ref 8.9–10.3)
Chloride: 107 mmol/L (ref 101–111)
GFR calc Af Amer: >60 mL/min (ref >60)
GLUCOSE: 114 mg/dL — AB (ref 65–99)
POTASSIUM: 3.9 mmol/L (ref 3.5–5.1)
SODIUM: 137 mmol/L (ref 135–145)

## 2017-08-18 MED ORDER — ORAL CARE MOUTH RINSE: 15.0000 mL | Freq: Two times a day (BID) | OROMUCOSAL | Status: DC

## 2017-08-18 MED ORDER — OXYCODONE HCL 5 MG PO TABS: 5.0000 mg | ORAL_TABLET | Freq: Four times a day (QID) | ORAL | 0 refills | Status: DC | PRN

## 2017-08-18 MED ORDER — OXYCODONE HCL 5 MG PO TABS: 5.0000 mg | ORAL_TABLET | ORAL | 0 refills | Status: DC | PRN

## 2017-08-18 MED ORDER — ATORVASTATIN CALCIUM 10 MG PO TABS: 10.0000 mg | ORAL_TABLET | Freq: Every day | ORAL | 4 refills | Status: DC

## 2017-08-18 NOTE — Progress Notes (Signed)
  Progress Note    08/18/2017 7:05 AM 1 Day Post-Op  Subjective:  "When can I go home"  Tm 99 now afebrile HR 70's-80's ST 387'F-643'P systolic (cuff) 295'J-884'Z systolic (a-line) 66% [id number]   Vitals:   08/18/17 0439 08/18/17 0636  BP: 132/80 100/85  Pulse:    Resp: 18 19  Temp: 98.7 F (37.1 C)   SpO2: 99% 98%    Physical Exam: Cardiac:  regular Lungs:  Non labored Incisions:  Left chest incision is clean and dry; right groin is soft without hematoma Extremities:  Easily palpable bilateral DP pulses and left radial pulse Abdomen:  Soft, NT/ND  CBC    Component Value Date/Time   WBC 11.0 (H) 08/18/2017 0500   RBC 4.02 08/18/2017 0500   HGB 10.6 (L) 08/18/2017 0500   HGB 12.9 10/06/2016 1014   HGB 9.4 (L) 09/19/2007 0943   HCT 33.4 (L) 08/18/2017 0500   HCT 39.5 10/06/2016 1014   HCT 28.1 (L) 09/19/2007 0943   PLT 145 (L) 08/18/2017 0500   PLT 243 10/06/2016 1014   MCV 83.1 08/18/2017 0500   MCV 84 10/06/2016 1014   MCV 78.7 (L) 09/19/2007 0943   MCH 26.4 08/18/2017 0500   MCHC 31.7 08/18/2017 0500   RDW 15.2 08/18/2017 0500   RDW 14.6 10/06/2016 1014   RDW 15.9 (H) 09/19/2007 0943   LYMPHSABS 1.3 10/06/2016 1014   LYMPHSABS 1.1 09/19/2007 0943   MONOABS 0.6 04/07/2008 0954   MONOABS 0.7 09/19/2007 0943   EOSABS 0.2 10/06/2016 1014   BASOSABS 0.1 10/06/2016 1014   BASOSABS 0.0 09/19/2007 0943    BMET    Component Value Date/Time   NA 137 08/18/2017 0500   NA 142 08/04/2017 1057   K 3.9 08/18/2017 0500   CL 107 08/18/2017 0500   CO2 25 08/18/2017 0500   GLUCOSE 114 (H) 08/18/2017 0500   BUN 12 08/18/2017 0500   BUN 13 08/04/2017 1057   CREATININE 0.82 08/18/2017 0500   CALCIUM 8.7 (L) 08/18/2017 0500   GFRNONAA >60 08/18/2017 0500   GFRAA >60 08/18/2017 0500    INR    Component Value Date/Time   INR 1.07 08/17/2017 2030     Intake/Output Summary (Last 24 hours) at 08/18/2017 0705 Last data filed at 08/18/2017 0651 Gross per 24  hour  Intake 2471.66 ml  Output 1350 ml  Net 1121.66 ml     Assessment:  71 y.o. female is s/p:  Left carotid-subclavian bypass graft and TEVAR  1 Day Post-Op  Plan: -pt doing well this am-she has easily palpable DP pulses and left radial pulse -she still has a-line-dc -OOB to chair and walk -anticipate discharge later this morning -f/u with Dr. Trula Slade in 2-3 weeks. -pt not on a statin - will d/w Dr. Trula Slade if she needs to be on one  Leontine Locket, [place] [facility] [phone] 08/18/2017 7:05 AM  I agree with the above Wants to go home  Palpable bilateral pedal pulses Can left legs off bed.  No neuro deficits Neck incision is soft.  Palpable radial pulses  She will be started on a low dose statin, to be further managed by her PCP D/c aline Ambulate in halls If doing well later today, can d/c home.  Discussed risk of delayed paraparesis and to call if she has symptopms  Wells Brabham

## 2017-08-18 NOTE — Discharge Instructions (Signed)
   [facility]   Discharge Instructions  Endovascular Aortic Aneurysm Repair  Please refer to the following instructions for your post-procedure care. Your surgeon or Physician Assistant will discuss any changes with you.  Activity  You are encouraged to walk as much as you can. You can slowly return to normal activities but must avoid strenuous activity and heavy lifting until your doctor tells you it's OK. Avoid activities such as vacuuming or swinging a gold club. It is normal to feel tired for several weeks after your surgery. Do not drive until your doctor gives the OK and you are no longer taking prescription pain medications. It is also normal to have difficulty with sleep habits, eating, and bowel movements after surgery. These will go away with time.  Bathing/Showering  You may shower after you go home. If you have an incision, do not soak in a bathtub, hot tub, or swim until the incision heals completely.  Incision Care  Shower every day. Clean your incision with mild soap and water. Pat the area dry with a clean towel. You do not need a bandage unless otherwise instructed. Do not apply any ointments or creams to your incision. If you clothing is irritating, you may cover your incision with a dry gauze pad.  Diet  Resume your normal diet. There are no special food restrictions following this procedure. A low fat/low cholesterol diet is recommended for all patients with vascular disease. In order to heal from your surgery, it is CRITICAL to get adequate nutrition. Your body requires vitamins, minerals, and protein. Vegetables are the best source of vitamins and minerals. Vegetables also provide the perfect balance of protein. Processed food has little nutritional value, so try to avoid this.  Medications  Resume taking all of your medications unless your doctor or nurse practitioner tells you not to. If your incision is causing pain, you may take  over-the-counter pain relievers such as acetaminophen (Tylenol). If you were prescribed a stronger pain medication, please be aware these medications can cause nausea and constipation. Prevent nausea by taking the medication with a snack or meal. Avoid constipation by drinking plenty of fluids and eating foods with a high amount of fiber, such as fruits, vegetables, and grains. Do not take Tylenol if you are taking prescription pain medications.   Follow up  Our office will schedule a follow-up appointment with a C.T. scan 3-4 weeks after your surgery.  Please call us immediately for any of the following conditions  Severe or worsening pain in your legs or feet or in your abdomen back or chest. Increased pain, redness, drainage (pus) from your incision sit. Increased abdominal pain, bloating, nausea, vomiting or persistent diarrhea. Fever of 101 degrees or higher. Swelling in your leg (s),  Reduce your risk of vascular disease  Stop smoking. If you would like help call [facility] at 1-800-QUIT-NOW (1-800-784-8669) or [place] at 336-586-4000. Manage your cholesterol Maintain a desired weight Control your diabetes Keep your blood pressure down  If you have questions, please call the office at [phone].   

## 2017-08-18 NOTE — Progress Notes (Signed)
Pt ambulated 470 ft using RW.  Expressed "It feels great" to be up and moving.  To recliner after walk, no complaints.  Will con't plan of care.

## 2017-08-18 NOTE — Discharge Summary (Signed)
[facility] Discharge Summary   Nancy Montoya 1946-03-16 71 y.o. female  MRN: [id number]  Admission Date: 08/17/2017  Discharge Date: 08/18/17  Physician: Serafina Mitchell, MD  Admission Diagnosis: Thoracic Aortic Aneurysm   171.2   HPI:   This is a 71 y.o. female who is Referred for evaluation of a descending thoracic aortic aneurysm.  The patient is status post replacement of her ascending aorta with an aorto to innominate and aorto left carotid bypass graft.  She has had progressive enlargement of her descending thoracic aorta and is here today to discuss repair.  The patient has had worsening control of her hypertension.  She is a former smoker.  She is not on a statin.    Hospital Course:  The patient was admitted to the hospital and taken to the operating room on 08/17/2017 and underwent: Left carotid subclavian bypass grafting and TEVAR.    The pt tolerated the procedure well and was transported to the PACU in good condition.   By POD 1, she was doing well.  She had palpable DP pulses bilaterally as well as easily palpable left radial pulse.  Her a-line was removed.  She was able to lift her legs off the bed and did not have any neuro deficits.  She was started on a statin.  Dr. Trula Slade discussed the risk of delayed paraparesis and advised pt to call if she has any sx.  The remainder of the hospital course consisted of increasing mobilization and increasing intake of solids without difficulty.  CBC    Component Value Date/Time   WBC 11.0 (H) 08/18/2017 0500   RBC 4.02 08/18/2017 0500   HGB 10.6 (L) 08/18/2017 0500   HGB 12.9 10/06/2016 1014   HGB 9.4 (L) 09/19/2007 0943   HCT 33.4 (L) 08/18/2017 0500   HCT 39.5 10/06/2016 1014   HCT 28.1 (L) 09/19/2007 0943   PLT 145 (L) 08/18/2017 0500   PLT 243 10/06/2016 1014   MCV 83.1 08/18/2017 0500   MCV 84 10/06/2016 1014   MCV 78.7 (L) 09/19/2007 0943   MCH 26.4 08/18/2017 0500   MCHC 31.7 08/18/2017 0500   RDW  15.2 08/18/2017 0500   RDW 14.6 10/06/2016 1014   RDW 15.9 (H) 09/19/2007 0943   LYMPHSABS 1.3 10/06/2016 1014   LYMPHSABS 1.1 09/19/2007 0943   MONOABS 0.6 04/07/2008 0954   MONOABS 0.7 09/19/2007 0943   EOSABS 0.2 10/06/2016 1014   BASOSABS 0.1 10/06/2016 1014   BASOSABS 0.0 09/19/2007 0943    BMET    Component Value Date/Time   NA 137 08/18/2017 0500   NA 142 08/04/2017 1057   K 3.9 08/18/2017 0500   CL 107 08/18/2017 0500   CO2 25 08/18/2017 0500   GLUCOSE 114 (H) 08/18/2017 0500   BUN 12 08/18/2017 0500   BUN 13 08/04/2017 1057   CREATININE 0.82 08/18/2017 0500   CALCIUM 8.7 (L) 08/18/2017 0500   GFRNONAA >60 08/18/2017 0500   GFRAA >60 08/18/2017 0500         Discharge Diagnosis:  Thoracic Aortic Aneurysm   171.2  Secondary Diagnosis: Patient Active Problem List   Diagnosis Date Noted  . Thoracic aortic aneurysm, without rupture ([place]) 08/17/2017  . Elevated serum creatinine 07/25/2017  . S/P aortic arch reconstruction 10/20/2016  . Thoracic aortic aneurysm without rupture ([place])   . EPSTEIN-BARR VIRUS 09/21/2007  . FATIGUE 09/21/2007  . LIVER FUNCTION TESTS, ABNORMAL 09/21/2007  . HYPERTENSION, HX OF 09/21/2007  . LIPOMA 09/05/2007  .  CARDIOMEGALY, MILD 08/10/2007   Past Medical History:  Diagnosis Date  . Blood transfusion without reported diagnosis 2009   anemia  . Hypertension   . Pneumonia    hx recent  . Thoracic aortic aneurysm without rupture ([facility])      Allergies as of 08/18/2017      Reactions   Codeine Other (See Comments)   hallucinations   Latex Rash      Medication List    TAKE these medications   aspirin EC 81 MG tablet Take 81 mg by mouth daily.   atorvastatin 10 MG tablet Commonly known as:  LIPITOR Take 1 tablet (10 mg total) daily by mouth.   metoprolol tartrate 25 MG tablet Commonly known as:  LOPRESSOR Take 50 mg 2 (two) times daily by mouth.   olmesartan 40 MG tablet Commonly known as:  BENICAR Take 1 tablet  (40 mg total) by mouth daily.   oxyCODONE 5 MG immediate release tablet Commonly known as:  Oxy IR/ROXICODONE Take 1 tablet (5 mg total) every 6 (six) hours as needed by mouth for moderate pain.       Discharge Instructions:  [facility]  Discharge Instructions Endovascular Aortic Aneurysm Repair  Please refer to the following instructions for your post-procedure care. Your surgeon or Physician Assistant will discuss any changes with you.  Activity  You are encouraged to walk as much as you can. You can slowly return to normal activities but must avoid strenuous activity and heavy lifting until your doctor tells you it's OK. Avoid activities such as vacuuming or swinging a gold club. It is normal to feel tired for several weeks after your surgery. Do not drive until your doctor gives the OK and you are no longer taking prescription pain medications. It is also normal to have difficulty with sleep habits, eating, and bowel movements after surgery. These will go away with time.  Bathing/Showering  You may shower after you go home. If you have an incision, do not soak in a bathtub, hot tub, or swim until the incision heals completely.  Incision Care  Shower every day. Clean your incision with mild soap and water. Pat the area dry with a clean towel. You do not need a bandage unless otherwise instructed. Do not apply any ointments or creams to your incision. If you clothing is irritating, you may cover your incision with a dry gauze pad.  Diet  Resume your normal diet. There are no special food restrictions following this procedure. A low fat/low cholesterol diet is recommended for all patients with vascular disease. In order to heal from your surgery, it is CRITICAL to get adequate nutrition. Your body requires vitamins, minerals, and protein. Vegetables are the best source of vitamins and minerals. Vegetables also provide the perfect balance of protein.  Processed food has little nutritional value, so try to avoid this.  Medications  Resume taking all of your medications unless your doctor or Physician Assistnat tells you not to. If your incision is causing pain, you may take over-the-counter pain relievers such as acetaminophen (Tylenol). If you were prescribed a stronger pain medication, please be aware these medications can cause nausea and constipation. Prevent nausea by taking the medication with a snack or meal. Avoid constipation by drinking plenty of fluids and eating foods with a high amount of fiber, such as fruits, vegetables, and grains. Do not take Tylenol if you are taking prescription pain medications.   Follow up  [place] office  will schedule a follow-up appointment with a C.T. scan 3-4 weeks after your surgery.  Please call us immediately for any of the following conditions  Severe or worsening pain in your legs or feet or in your abdomen back or chest. Increased pain, redness, drainage (pus) from your incision sit. Increased abdominal pain, bloating, nausea, vomiting or persistent diarrhea. Fever of 101 degrees or higher. Swelling in your leg (s),  Reduce your risk of vascular disease  .Stop smoking. If you would like help call [facility] at 1-800-QUIT-NOW 3153252321) or [place] at (530) [phone]. .Manage your cholesterol .Maintain a desired weight .Control your diabetes .Keep your blood pressure down  If you have questions, please call the office at [phone].    Prescriptions given: Roxicodone #10 No Refill Lipitor 10mg  daily #30 4RF  Disposition: home  Patient's condition: is Good  Follow up: 1. Dr. Arita Miss in 4 weeks with CTA protocol   Leontine Locket, PA-C [facility] [phone] 08/18/2017  8:40 AM   - For VQI Registry use - Post-op:  Time to Extubation: [x]  In OR, [ ]  < 12 hrs, [ ]  12-24 hrs, [ ]  >=24 hrs Vasopressors Req. Post-op: No MI: No., [ ]  Troponin only, [  ] EKG or Clinical New Arrhythmia: No CHF: No ICU Stay: 1 day in stepdown Transfusion: No     If yes, n/a units given  Complications: Resp failure: No., [ ]  Pneumonia, [ ]  Ventilator Chg in renal function: No., [ ]  Inc. Cr > 0.5, [ ]  Temp. Dialysis,  [ ]  Permanent dialysis Leg ischemia: No., no Surgery needed, [ ]  Yes, Surgery needed,  [ ]  Amputation Bowel ischemia: No., [ ]  Medical Rx, [ ]  Surgical Rx Wound complication: No., [ ]  Superficial separation/infection, [ ]  Return to OR Return to OR: No  Return to OR for bleeding: No Stroke: No., [ ]  Minor, [ ]  Major  Discharge medications: Statin use:  Yes  ASA use:  Yes  Plavix use:  No  Beta blocker use:  Yes  ARB use:  Yes ACEI use:  No CCB use:  No

## 2017-08-18 NOTE — Progress Notes (Signed)
Pt discharging home with husband.  All instructions and prescriptions given and reviewed. Follow up appts in place.  All questions answered 

## 2017-08-19 NOTE — Op Note (Signed)
Nancy Montoya  [id number]  CARDIOVASCULAR SURGERY OPERATIVE NOTE   08/17/2017  Surgeon: Gaye Pollack, MD   Co-Surgeon: Theotis Burrow IV   Preoperative Diagnosis: Descending thoracic aortic aneurysm  Postoperative Diagnosis: Same   Procedure:  1: Left carotid to subclavian artery bypass 2. Percutaneous access 3: Endovascular repair of descending thoracic aortic aneurysm with coverage of the left subclavian artery 4: Distal extension x 2  5: Catheter in the thoracic aorta   6: Aortic arch angiogram  7: Thoracic aortogram   Anesthesia: General Endotracheal   Clinical History/Surgical Indication:   The patient is a 71 year old woman who underwent Bentall procedure using a 21 mm Medtronic Freestyle Porcine Root and replacement of the ascending aorta and aortic arch with aorto-innominate and aorto-left carotid bypass using a 28 x 10 x 8 x 8 x 10 mm Hemashield graft, right axillary artery cannulation on 10/20/2016. The aorta narrowed to a reasonable caliber between the left carotid and subclavian arteries so the anastomosis was done there. She also had a descending thoracic aortic aneurysm  which I felt would require repair once she recovered from her initial surgery. The descending aorta measured 4.8 cm on her CT in 09/2016 and now was 5.1 cm. With her worsening HTN and continued enlargement we felt that surgical repair was indicated.   I discussed the procedure with the patient and her husband including alternatives, benefits and risks including but not limited to bleeding, blood transfusion, infection, vascular complications, need for open repair, paresis or paralysis, need for further surgery due to endoleak or progression or aortic disease, organ dysfunction, and death. She  understands and agrees to proceed.    Preparation:   The patient was seen in the preoperative holding area and the correct patient, correct operation were confirmed with the patient after reviewing the  medical record and catheterization. The consent was signed by me. Preoperative antibiotics were given. A right internal jugular central line and radial arterial line were placed by the anesthesia team. The patient was taken back to the operating room and positioned supine on the operating room table. After being placed under general endotracheal anesthesia by the anesthesia team a foley catheter was placed. The neck, chest, abdomen, and both legs were prepped with betadine soap and solution and draped in the usual sterile manner. A surgical time-out was taken and the correct patient and operative procedure were confirmed with the nursing and anesthesia staff.   Left carotid to subclavian artery bypass:   A transverse incision was made in the lower left neck. The clavicular head of the SCM muscle was divided. The left common carotid artery was identified and encircled with a silastic loop. The phrenic nerve was identified along the anterior border of the anterior scalene muscle and carefully preserved. The anterior scalene muscle was divided to expose the left subclavian artery. The vertebral artery was identified and preserved. The subclavian artery was aneurysmal proximally but after the thyrocervical trunk it was normal sized and therefore we felt that a bypass to this area would be better than a transposition of the aneurysmal artery. The branches of the thyrocervical trunk were controled. The thoracic duct was identified and ligated with sutures and divided. The patient was heparinized and the distal portion of the left subclavian artery controlled with vascular clamps. A 7 mm Hemashield graft was anastomosed to the subclavian artery in an end to side manner using continuous 5-0 prolene suture. The clamps were removed. The graft was passed anterior  to the phrenic nerve and cut to the appropriate length. The left carotid artery was controlled with vascular clamps and a short lateral arteriotomy was performed  and a 5 mm punch used to create a site for anastomosis. The graft was then anastomosed to the carotid using continuous 5-0 prolene suture. The graft was flushed and then the sutures tied. The left subclavian artery flow was established and then the left carotid clamps removed. There was a good doppler signal in the carotid and subclavian arteries distal to the graft. There was good hemostasis. The wound was packed with gauze.    Insertion of endovascular graft:    The right common femoral artery was visualized with ultrasound and cannulated with an 18-gauge needle followed by a Britta Mccreedy wire. Two ProGlide closure devices were placed at 10:00 and 2:00. An 8-F sheath was placed and a pigtail catheter was advanced with the wire into the ascending aorta. The [facility] wire was exchanged for a double curved Lunderquist wire. A 24 F dry seal sheath was inserted over the wire into the aorta. The proximal graft was a Gore TAG 34 x 34 x 20 cm. This was advanced into the descending thoracic aorta. Then a Britta Mccreedy wire was inserted through the dry seal sheath beside the device and a pigtail catheter was inserted over the wire and advanced into the aorta. An aortic arch arteriogram was performed to localize grafts to the innominate and left common carotid arteries. The proximal graft was positioned and deployed in excellent position. The pigtail was pulled back and advanced through the graft and another aortogram confirmed excellent position. Then the first extension graft was prepared. This was a 40 x 40 x 20 cm Gore TAG. This was advanced inside the proximal graft with a 5 cm overlap. The extension graft was deployed. The pigtail was re-inserted into the distal thoracic aorta and a thoracic aortogram performed to confirm position of the celiac axis. Then the second extension graft was prepared. This was a 40 x 40 x 15 cm Gore TAG. This was advanced inside the proximal graft so that the distal end was just proximal to the  celiac axis. The extension graft was deployed. The pigtail was then advanced into the ascending aorta and an aortogram showed no endoleak with the proximal end of the graft just distal to the left carotid graft. The left subclavian was covered with no flow in the stump. There was good flow in the left carotid subclavian bypass graft. The distal end of the endograft was just proximal to the celiac axis. The catheters were removed followed by the sheath, leaving the wire. The ProGlide closure devices were tied and there was good hemostasis. Protamine was given. The skin was closed with Vicryl suture. The neck incision was examined, hemostasis confirmed and it was closed in layers.  Dermabond was applied. All sponge, needle, and instrument counts were reported correct at the end of the case. The patient was awakened, moved her lower extremities to command and was transported to the PACU in hemodynamically stable and satisfactory condition.    Gaye Pollack, MD

## 2017-08-20 DIAGNOSIS — I712 Thoracic aortic aneurysm, without rupture: Secondary | ICD-10-CM | POA: Diagnosis not present

## 2017-08-20 NOTE — Op Note (Signed)
Patient name: Nancy Montoya MRN: [id number] DOB: January 23, 1946 Sex: female  08/17/2017 Pre-operative Diagnosis: Descending thoracic aortic aneurysm Post-operative diagnosis:  Same Surgeon:  Annamarie Major Co-surgeon:  Gilford Raid Procedure:   #1: Left carotid to left subclavian bypass graft with a 7 mm Dacron graft   #2: Endovascular repair of descending thoracic aortic aneurysm with coverage of left subclavian artery   #3: Percutaneous ultrasound-guided access, right femoral artery   #4: Catheter in the order x1   #5: Aortic arch angiogram   #6: Abdominal aortogram   #7: Distal extension Anesthesia: General Blood Loss: 100 cc Specimens: None  Findings: Complete exclusion Devices used: Proximal piece was a Gore CYA 34 x 20.G extension was a Gore 40 x 20, distal extension was a Sport and exercise psychologist CTA 40 x 15G   Indications: A Bentall procedure and a sending aorta to innominate and left carotid bypass graft.  She has had progressive enlargement of her descending thoracic aorta and is here today for repair.  The patient has previously undergone a dental procedure with aorto innominate and aorto left carotid bypass graft.  She has had progressive enlargement of her descending thoracic aorta and is here today for repair.  Procedure:  The patient was identified in the holding area and taken to [facility]  The patient was then placed supine on the table. general anesthesia was administered.  The patient was prepped and draped in the usual sterile fashion.  A time out was called and antibiotics were administered.  A left supraclavicular incision was made beginning at the midline and extending out approximately 7 cm.  Through this incision, the platysma muscle was divided with cautery.  The left carotid artery was identified and fully mobilized.  The internal jugular vein appeared hypoplastic.  Dissection was then carried down in between the carotid and left subclavian artery.  The vagus nerve was identified  and protected.  The subclavian artery was then identified within the incision and dissected proximally.  We identified the vertebral artery and the mammary artery.  The base of the subclavian artery appeared to be very aneurysmal and was not suitable for transposition.  Therefore dissection was then carried out lateral to the internal jugular vein multiple branches and the main thoracic duct were visualized and divided.  The phrenic nerve was visualized on the medial side of the anterior scalene.  It was mobilized and protected.  The anterior scalene muscle was then divided with Bovie cautery and the subclavian artery was circumferentially mobilized.  The artery at this level was not aneurysmal.  The level of the elected anastomosis was near the thyrocervical trunk.  At this point, the patient was fully heparinized.  After the heparin circulated the subclavian artery was clamped proximally near the origin and distally at the level of the vertebral artery.  The subclavian artery was then divided.  The stump was oversewn with 2 layers of 5-0 Prolene and then additional pledgeted sutures.  The distal end was then oversewn in between 2 strips of felt using a 5-0 Prolene.  Once the clamps were released, the anastomosis was found to be hemostatic.  The clamps were then repositioned distal to the thyrocervical trunk.  A longitudinal arteriotomy was made with an 11 blade and extended with Potts scissors.  A 7 mm [place] graft was then brought onto the field and beveled to fit the size of the arteriotomy.  A running anastomosis was then created with 5-0 Prolene.  Prior to completion the  appropriate flushing maneuvers were performed and the anastomosis was completed.  The graft was then brought posterior to the hypoplastic internal jugular vein.  A site was selected on the posterior lateral side of the common carotid artery for the anastomosis.  The carotid artery was then occluded with vascular clamps.  A #11 blade was used to  make an arteriotomy which was opened further with a #5 punch.  The graft was cut to the appropriate length and a end-to-side anastomosis was created with 5-0 Prolene.  Prior to completion the appropriate flushing maneuvers were performed and the anastomosis was completed.  There were good Doppler signals within the subclavian and common carotid and distal carotid artery.  The area was then packed with a Ray-Tec and attention was turned towards the groin.  Ultrasound was used to evaluate the right femoral artery which is widely patent without significant calcification.  A #11 blade was used to make a skin nick.  The right common femoral artery was then cannulated under ultrasound guidance with an 18-gauge needle.  No 3 5 wire was advanced without resistance.  The subcutaneous tract was dilated with an 8 [place] dilator.  Probe glide devices were deployed at the 11:00 and 1 o'clock position for pre-closure.  An 8 French sheath was placed.  Additional heparin was administered.  A pigtail catheter was advanced over the Bentson wire into the ascending aorta.  The Bentson wire was removed and a Lunderquist double curved wire was placed.  A 24 French sheath was then inserted into the aorta.  The primary device was a Gore CTAG 34 x 20 device.  It was advanced into the descending thoracic aorta.  A second access in the dry seal sheath was obtained and a pigtail catheter was advanced into the ascending aorta and an aortogram of the aortic arch was performed locating the innominate and left carotid artery.  This did show that the carotid subclavian bypass was widely patent.  The device was then advanced around the aortic arch and positioned at the origin of the left common carotid artery.  It was then deployed.  The delivery system was removed and the pigtail catheter was advanced into the graft and an abdominal aortogram was performed.  A second device was then prepared on the back table and inserted.  This was a Gore CTAG  40 x 20 device.  It was deployed with approximately 5 cm of overlap.  The delivery system was removed and again the pigtail catheter was advanced into the device.  An abdominal aortogram was then performed.  This showed the origin of the celiac artery superior mesenteric artery and renal arteries all which were patent.  A final distal extension was prepared and inserted.  This was a Gore CTAG 40 x 15 device.  It was deployed landing just proximal to the celiac artery.  A trilobed balloon was then used to mold the device overlap.  Next a completion arteriogram was performed which showed successful exclusion of the aneurysm and continued patency of the innominate left carotid and left carotid to left subclavian bypass.  The celiac artery also remained patent as were the branch vessels of the abdominal aorta.  The Lunderquist wire was removed and a Bentson wire was placed.  The sheath was then removed and a probe glide devices were the deployed for closure of the arteriotomy.  Next attention was turned back towards the neck.  The wound was irrigated.  Hemostasis was excellent.  The sternocleidomastoid was reapproximated with  Vicryl.  The platysma muscles reapproximated 3-0 Vicryl and skin was closed with 4-0 Vicryl followed by Dermabond.  Dermabond was placed on the groin cannulation site.  The patient was then successfully extubated and taken to recovery room in stable condition.  There were no immediate complications.   Disposition: To PACU stable, moving all 4 extremities.   Theotis Burrow, M.D. [facility] Office: [phone] Pager:  [phone]

## 2017-08-21 ENCOUNTER — Telehealth: Payer: Self-pay | Admitting: Surgery

## 2017-08-21 NOTE — Telephone Encounter (Signed)
-----   Message from Mena Goes, RN sent at 08/18/2017  1:43 PM EST ----- Regarding: 4 weeks post TEVAR   ----- Message ----- From: Gabriel Earing, PA-C Sent: 08/18/2017   7:23 AM To: Vvs Charge Pool  Pt is s/p TEVAR and carotid subclavian bypass graft.  F/u with Dr. Trula Slade in 4 weeks.  Thanks

## 2017-08-21 NOTE — Telephone Encounter (Signed)
Sched appt 09/18/17 at 11:30. Spoke to pt.

## 2017-08-22 ENCOUNTER — Telehealth: Payer: Self-pay | Admitting: *Deleted

## 2017-08-22 NOTE — Telephone Encounter (Signed)
Patient called to c/o "abdominal pain and constipation" Advised Milk of Mag, prune juice and plenty of water. If any worsening of symptoms of abdominal pain not related to constipation, advised to go to the Emergency Room for treatment.

## 2017-09-18 ENCOUNTER — Ambulatory Visit (INDEPENDENT_AMBULATORY_CARE_PROVIDER_SITE_OTHER): Payer: PPO | Admitting: Pharmacist

## 2017-09-18 ENCOUNTER — Ambulatory Visit (INDEPENDENT_AMBULATORY_CARE_PROVIDER_SITE_OTHER): Payer: PPO | Admitting: Surgery

## 2017-09-18 ENCOUNTER — Encounter: Payer: Self-pay | Admitting: Surgery

## 2017-09-18 VITALS — BP 149/77 | HR 70 | Temp 97.9°F | Resp 16 | Ht 64.0 in | Wt 140.0 lb

## 2017-09-18 VITALS — BP 136/70 | HR 64

## 2017-09-18 DIAGNOSIS — I10 Essential (primary) hypertension: Secondary | ICD-10-CM | POA: Diagnosis not present

## 2017-09-18 DIAGNOSIS — I712 Thoracic aortic aneurysm, without rupture, unspecified: Secondary | ICD-10-CM

## 2017-09-18 NOTE — Patient Instructions (Signed)
It was nice to meet you today  Continue to take your current medications and drink plenty of water  Monitor your blood pressure at home and record your readings  Please bring your cuff with you to your next visit in

## 2017-09-18 NOTE — Progress Notes (Signed)
   Patient name: Nancy Montoya MRN: [id number] DOB: 1946-05-16 Sex: female  REASON FOR VISIT:     post op  HISTORY OF PRESENT ILLNESS:   Nancy Montoya is a 71 y.o. female who returns today for her first postoperative visit.  She is status post left carotid to left subclavian bypass graft with a 7 mm dacryon graft as well as endovascular repair of a descending thoracic aortic aneurysm.  Her postoperative course was uncomplicated.  She is back today for follow-up.  She reports that she has had a cough for the past 5 days and will get dizzy when she stands up and sometimes describes the room as spinning around.  She feels like her dizziness is related to her blood pressure.  CURRENT MEDICATIONS:    Current Outpatient Medications  Medication Sig Dispense Refill  . aspirin EC 81 MG tablet Take 81 mg by mouth daily.    Marland Kitchen atorvastatin (LIPITOR) 10 MG tablet Take 1 tablet (10 mg total) daily by mouth. 30 tablet 4  . metoprolol tartrate (LOPRESSOR) 25 MG tablet Take 50 mg 2 (two) times daily by mouth.     . olmesartan (BENICAR) 40 MG tablet Take 1 tablet (40 mg total) by mouth daily. 30 tablet 11   No current facility-administered medications for this visit.     REVIEW OF SYSTEMS:   [X]  denotes positive finding, [ ]  denotes negative finding Cardiac  Comments:  Chest pain or chest pressure:    Shortness of breath upon exertion:    Short of breath when lying flat:    Irregular heart rhythm:    Constitutional    Fever or chills:      PHYSICAL EXAM:   Vitals:   09/18/17 1143 09/18/17 1145  BP: 140/77 (!) 149/77  Pulse: 70   Resp: 16   Temp: 97.9 F (36.6 C)   TempSrc: Oral   SpO2: 98%   Weight: 140 lb (63.5 kg)   Height: 5\' 4"  (1.626 m)     GENERAL: The patient is a well-nourished female, in no acute distress. The vital signs are documented above. CARDIOVASCULAR: There is a regular rate and rhythm. PULMONARY: Non-labored  respirations Palpable radial pulse bilaterally.  Palpable dorsalis pedis pulse bilateral  Right groin incision has healed nicely.  Left neck incision is healed nicely  STUDIES:   Unfortunately the patient does not have a CT angiogram in the postoperative period   MEDICAL ISSUES:   Status post repair of descending thoracic aortic aneurysm: Unfortunately, the patient does not have a CT angiogram for review.  I am going to order this and have her scheduled to see Dr. Mohammed Kindle in 2-3 weeks.  She is scheduled to see cardiology today for follow-up of her blood pressure management.  The patient's symptoms do not appear to be consistent with vertebrobasilar insufficiency, given the fact that she has a higher blood pressure in the left arm, and a palpable radial pulse.  I suspect this is either related to orthostasis, or potentially vertigo.  I have her a follow-up for myself in 6 months with a CT angiogram.  Annamarie Major, MD [facility] [phone] Pager [phone]

## 2017-09-18 NOTE — Progress Notes (Signed)
Patient ID: Nancy Montoya                 DOB: 04/07/1946                      MRN: [id number]     HPI: Nancy Montoya is a 71 y.o. female referred by Dr. Angelena Form to HTN clinic. PMH is significant for HTN and thoracic aortic aneurysm s/p aortic arch replacement and porcine aortic valve replacement Pt was last seen in the office 2 months ago. At that time, BP was elevated to 170/82 and olmesartan dose was increased to 40mg  daily. F/u SCr increased from 0.95 to 1.3 however normalized with subsequent checks. Pt presents today for further management.  Pt presents today in good spirits. She has been adherent to her medications. She has been experiencing dizziness and headaches starting 1-2 weeks after her olmesartan dose was increased. Her dizziness occurs about once a day, particularly when she goes from laying down to sitting up. She experiences vertigo occasionally. Her home systolic BP readings have mostly been in the upper 110s with a low of 101. She has not experienced any systolic readings > 631. She had an appt earlier today where her BP in the office was 149/70. She takes her medications at 11am each day. Once she returns to work, she will take her medications at 8am.  BP in clinic today initially 150/72. When checked 5 minutes later, BP had improved to 136/70.  Current HTN meds: metoprolol tartrate 50mg  BID, olmesartan 40mg  daily BP goal: <130/59mmHg  Family History: No significant family history.  Social History: Former smoker 1/2 PPD for 40 years, quit in 2009. Denies alcohol and illicit drug use.  Diet: 1/2 cup of coffee each day. Drinks plenty of water. Does go out to [employer] or [employer] frequently - likes fried chicken tenders, okra, potatoes, and hamburgers.    Wt Readings from Last 3 Encounters:  08/17/17 150 lb 12.7 oz (68.4 kg)  08/11/17 149 lb 8 oz (67.8 kg)  07/19/17 152 lb 4.8 oz (69.1 kg)   BP Readings from Last 3 Encounters:  08/18/17 100/85  08/11/17 (!) 159/64    07/19/17 (!) 190/97   Pulse Readings from Last 3 Encounters:  08/17/17 85  08/11/17 (!) 58  07/19/17 (!) 54    Renal function: CrCl cannot be calculated (Patient's most recent lab result is older than the maximum 21 days allowed.).  Past Medical History:  Diagnosis Date  . Blood transfusion without reported diagnosis 2009   anemia  . Hypertension   . Pneumonia    hx recent  . Thoracic aortic aneurysm without rupture [facility])     Current Outpatient Medications on File Prior to Visit  Medication Sig Dispense Refill  . aspirin EC 81 MG tablet Take 81 mg by mouth daily.    Marland Kitchen atorvastatin (LIPITOR) 10 MG tablet Take 1 tablet (10 mg total) daily by mouth. 30 tablet 4  . metoprolol tartrate (LOPRESSOR) 25 MG tablet Take 50 mg 2 (two) times daily by mouth.     . olmesartan (BENICAR) 40 MG tablet Take 1 tablet (40 mg total) by mouth daily. 30 tablet 11  . oxyCODONE (OXY IR/ROXICODONE) 5 MG immediate release tablet Take 1 tablet (5 mg total) every 6 (six) hours as needed by mouth for moderate pain. 10 tablet 0   No current facility-administered medications on file prior to visit.     Allergies  Allergen Reactions  . Codeine  Other (See Comments)    hallucinations  . Latex Rash     Assessment/Plan:  1. Hypertension - BP remains above goal <130/18mmHg  in clinic however home readings are at goal and pt has been symptomatic with dizziness. She is agreeable to continuing current doses of metoprolol and olmesartan, however I have advised her to monitor her BP over the next few weeks and bring in her home readings and BP cuff. If home cuff is accurate, pt is likely experiencing white coat hypertension and could cut back on HTN medications. Systolic BP did improve by ~86mmHg when checked 5 minutes later during office visit. Also advised pt to stay well hydrated with water and try to reduce salt intake. F/u in HTN clinic in 3 weeks and advised pt to call clinic sooner with any concerns or if  dizziness worsens.   Megan E. Supple, PharmD, CPP, [place] [address] [place], [place] [address] Phone: [phone]; Fax: [phone] 09/18/2017 2:05 PM

## 2017-10-04 NOTE — Addendum Note (Signed)
Addended by: Lianne Cure A on: 10/04/2017 04:15 PM   Modules accepted: Orders

## 2017-10-12 ENCOUNTER — Ambulatory Visit (INDEPENDENT_AMBULATORY_CARE_PROVIDER_SITE_OTHER): Payer: PPO | Admitting: Pharmacist

## 2017-10-12 VITALS — BP 144/80 | HR 61

## 2017-10-12 DIAGNOSIS — I10 Essential (primary) hypertension: Secondary | ICD-10-CM | POA: Diagnosis not present

## 2017-10-12 MED ORDER — METOPROLOL TARTRATE 50 MG PO TABS: 50.0000 mg | ORAL_TABLET | Freq: Two times a day (BID) | ORAL | 3 refills | Status: DC

## 2017-10-12 MED ORDER — OLMESARTAN MEDOXOMIL 40 MG PO TABS: 40.0000 mg | ORAL_TABLET | Freq: Every day | ORAL | 3 refills | Status: DC

## 2017-10-12 MED ORDER — ATORVASTATIN CALCIUM 10 MG PO TABS: 10.0000 mg | ORAL_TABLET | Freq: Every day | ORAL | 3 refills | Status: DC

## 2017-10-12 NOTE — Patient Instructions (Signed)
It was nice to see you today  Your blood pressure is excellent and at goal <130/36mmHg  Continue taking your current medications  Make sure you take your time when you go from sitting to standing. Call clinic if you notice frequent dizziness

## 2017-10-12 NOTE — Progress Notes (Signed)
Patient ID: Nancy Montoya                 DOB: [birth year]                      MRN: [id number]     HPI: Nancy Montoya is a 72 y.o. female referred by Dr. Angelena Form to HTN clinic. PMH is significant for HTN and thoracic aortic aneurysm s/p aortic arch replacement and porcine aortic valve replacement Pt was last seen in the office 2 months ago. At that time, BP was elevated to 170/82 and olmesartan dose was increased to 40mg  daily. F/u SCr increased from 0.95 to 1.3 however normalized with subsequent checks. At her last visit, BP was slightly elevated at 136/70, however home readings were all at goal and pt reported some dizziness. She was advised to monitor her BP at home and bring in her home cuff.  Pt presents today in good spirits. Her BP readings at home have been excellent - consistently in the 120s/80. Low of 117/76 and high of 137/81. She has only felt dizzy once in the last month and it was this morning when she stood up too quickly. Overall she has been feeling very well. Pt brings in her home cuff for calibration today. BP readings continue to trend down as they are checked throughout her visit. Home readings match clinic readings and both are higher than all of patient's home readings. Suspect white coat HTN.  Current HTN meds: metoprolol tartrate 50mg  BID, olmesartan 40mg  daily BP goal: <130/56mmHg  Family History: No significant family history.  Social History: Former smoker 1/2 PPD for 40 years, quit in 2009. Denies alcohol and illicit drug use.  Diet: 1/2 cup of coffee each day. Drinks plenty of water. Does go out to [employer] or [employer] frequently - likes fried chicken tenders, okra, potatoes, and hamburgers.   Home BP readings: consistently 120s/80. Low of 117/76, high of 137/81. HR 60-80s  Wt Readings from Last 3 Encounters:  09/18/17 140 lb (63.5 kg)  08/17/17 150 lb 12.7 oz (68.4 kg)  08/11/17 149 lb 8 oz (67.8 kg)   BP Readings from Last 3 Encounters:  09/18/17 136/70    09/18/17 (!) 149/77  08/18/17 100/85   Pulse Readings from Last 3 Encounters:  09/18/17 64  09/18/17 70  08/17/17 85    Renal function: CrCl cannot be calculated (Patient's most recent lab result is older than the maximum 21 days allowed.).  Past Medical History:  Diagnosis Date  . Blood transfusion without reported diagnosis 2009   anemia  . Hypertension   . Pneumonia    hx recent  . Thoracic aortic aneurysm without rupture [facility])     Current Outpatient Medications on File Prior to Visit  Medication Sig Dispense Refill  . aspirin EC 81 MG tablet Take 81 mg by mouth daily.    Marland Kitchen atorvastatin (LIPITOR) 10 MG tablet Take 1 tablet (10 mg total) daily by mouth. 30 tablet 4  . metoprolol tartrate (LOPRESSOR) 25 MG tablet Take 50 mg 2 (two) times daily by mouth.     . olmesartan (BENICAR) 40 MG tablet Take 1 tablet (40 mg total) by mouth daily. 30 tablet 11   No current facility-administered medications on file prior to visit.     Allergies  Allergen Reactions  . Codeine Other (See Comments)    hallucinations  . Latex Rash     Assessment/Plan:  1. Hypertension - Clinic BP readings  remains slightly above goal <130/30mmHg, however strongly suspect white coat HTN. Pt's home cuff readings match clinic reading (both of which are high today), however all home BP readings are controlled with most readings in the 120s/80. Will continue metoprolol tartrate 50mg  BID and olmesartan 40mg  daily. Advised pt to call if she notices frequent dizziness or if her BP readings start trending up. Advised her to let future health care providers know that she has white coat HTN to avoid overmedicating patient. F/u in HTN clinic as needed.   Nancy Montoya E. Supple, PharmD, CPP, [facility] [address] [place], [place] [address] Phone: [phone]; Fax: [phone] 10/12/2017 11:46 AM

## 2018-01-12 DIAGNOSIS — Z6824 Body mass index (BMI) 24.0-24.9, adult: Secondary | ICD-10-CM | POA: Diagnosis not present

## 2018-01-12 DIAGNOSIS — L304 Erythema intertrigo: Secondary | ICD-10-CM | POA: Diagnosis not present

## 2018-03-12 ENCOUNTER — Ambulatory Visit
Admission: RE | Admit: 2018-03-12 | Discharge: 2018-03-12 | Disposition: A | Discharge status: Home / Self Care | Payer: PPO | Source: Ambulatory Visit | Attending: Surgery | Admitting: Surgery

## 2018-03-12 DIAGNOSIS — I712 Thoracic aortic aneurysm, without rupture, unspecified: Secondary | ICD-10-CM

## 2018-03-12 MED ORDER — IOPAMIDOL (ISOVUE-370) INJECTION 76%
75.0000 mL | Freq: Once | INTRAVENOUS | Status: AC | PRN
  Administered 2018-03-12: 75 mL via INTRAVENOUS

## 2018-03-19 ENCOUNTER — Ambulatory Visit: Payer: PPO | Admitting: Surgery

## 2018-04-09 ENCOUNTER — Other Ambulatory Visit: Payer: Self-pay

## 2018-04-09 ENCOUNTER — Ambulatory Visit: Payer: PPO | Admitting: Surgery

## 2018-04-09 ENCOUNTER — Encounter: Payer: Self-pay | Admitting: Surgery

## 2018-04-09 VITALS — BP 173/77 | HR 59 | Resp 18 | Ht 64.0 in | Wt 140.3 lb

## 2018-04-09 DIAGNOSIS — I712 Thoracic aortic aneurysm, without rupture, unspecified: Secondary | ICD-10-CM

## 2018-04-09 NOTE — Progress Notes (Signed)
[facility] [place]  Patient name: Nancy Montoya MRN: [id number] DOB: 16-Sep-1946 Sex: female   REASON FOR VISIT:    Follow up  [place]:    Nancy Montoya is a 72 y.o. female who returns today for her first postoperative visit.  She is status post left carotid to left subclavian bypass graft with a 7 mm dacryon graft as well as endovascular repair of a descending thoracic aortic aneurysm.  Her postoperative course was uncomplicated.  She is back today for follow-up.  She has no complaints   PAST MEDICAL HISTORY:   Past Medical History:  Diagnosis Date  . Blood transfusion without reported diagnosis 2009   anemia  . Hypertension   . Pneumonia    hx recent  . Thoracic aortic aneurysm without rupture ([place])      FAMILY HISTORY:   Family History  Problem Relation Age of Onset  . Colon cancer Neg Hx     SOCIAL HISTORY:   Social History   Tobacco Use  . Smoking status: Former Smoker    Packs/day: 0.50    Years: 40.00    Pack years: 20.00    Types: Cigarettes    Start date: 10/06/1968    Last attempt to quit: 06/22/2008    Years since quitting: 9.8  . Smokeless tobacco: Never Used  Substance Use Topics  . Alcohol use: No     ALLERGIES:   Allergies  Allergen Reactions  . Codeine Other (See Comments)    hallucinations  . Latex Rash     CURRENT MEDICATIONS:   Current Outpatient Medications  Medication Sig Dispense Refill  . aspirin EC 81 MG tablet Take 81 mg by mouth daily.    Marland Kitchen atorvastatin (LIPITOR) 10 MG tablet Take 1 tablet (10 mg total) by mouth daily. 90 tablet 3  . metoprolol tartrate (LOPRESSOR) 50 MG tablet Take 1 tablet (50 mg total) by mouth 2 (two) times daily. 180 tablet 3  . olmesartan (BENICAR) 40 MG tablet Take 1 tablet (40 mg total) by mouth daily. 90 tablet 3   No current facility-administered medications for this visit.     REVIEW OF SYSTEMS:   [X]   denotes positive finding, [ ]  denotes negative finding Cardiac  Comments:  Chest pain or chest pressure:    Shortness of breath upon exertion:    Short of breath when lying flat:    Irregular heart rhythm:        Vascular    Pain in calf, thigh, or hip brought on by ambulation:    Pain in feet at night that wakes you up from your sleep:     Blood clot in your veins:    Leg swelling:         Pulmonary    Oxygen at home:    Productive cough:     Wheezing:         Neurologic    Sudden weakness in arms or legs:     Sudden numbness in arms or legs:     Sudden onset of difficulty speaking or slurred speech:    Temporary loss of vision in one eye:     Problems with dizziness:         Gastrointestinal    Blood in stool:     Vomited blood:         Genitourinary    Burning when urinating:     Blood in urine:  Psychiatric    Major depression:         Hematologic    Bleeding problems:    Problems with blood clotting too easily:        Skin    Rashes or ulcers:        Constitutional    Fever or chills:      PHYSICAL EXAM:   Vitals:   04/09/18 1119 04/09/18 1120  BP: (!) 171/80 (!) 173/77  Pulse: (!) 59   Resp: 18   SpO2: 98%   Weight: 140 lb 4.8 oz (63.6 kg)   Height: 5\' 4"  (1.626 m)     GENERAL: The patient is a well-nourished female, in no acute distress. The vital signs are documented above. CARDIAC: There is a regular rate and rhythm.  VASCULAR: palpable radial pulses PULMONARY: Non-labored respirations ABDOMEN: Soft and non-tender with normal pitched bowel sounds.  MUSCULOSKELETAL: There are no major deformities or cyanosis. NEUROLOGIC: No focal weakness or paresthesias are detected. SKIN: There are no ulcers or rashes noted. PSYCHIATRIC: The patient has a normal affect.  STUDIES:   I have reviewed the CT scan with the following findings: 1. Interval surgical changes of thoracic endovascular aortic repair of a transverse and descending thoracic  aortic aneurysm with de-branching of the left subclavian artery and a widely patent left carotid to subclavian bypass. 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 2. Mild left lower lobe atelectasis. 3. Additional ancillary findings as above including coronary artery calcifications without significant interval change.  MEDICAL ISSUES:   TAAA: The patient is recovering nicely from her operation.  Her CT scan today shows that her carotid subclavian bypass is widely patent and that her stent graft is in excellent position.  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  I discussed this with Dr. Laurence Ferrari.  This does appear to be accessible for percutaneous intervention.  We will plan on observing this for now and repeating her CT scan in 6 months.    Annamarie Major, MD [facility], [place] [phone] Pager [phone]

## 2018-04-12 ENCOUNTER — Other Ambulatory Visit: Payer: Self-pay

## 2018-04-12 DIAGNOSIS — I712 Thoracic aortic aneurysm, without rupture, unspecified: Secondary | ICD-10-CM

## 2018-05-17 DIAGNOSIS — H40053 Ocular hypertension, bilateral: Secondary | ICD-10-CM | POA: Diagnosis not present

## 2018-05-17 DIAGNOSIS — H40013 Open angle with borderline findings, low risk, bilateral: Secondary | ICD-10-CM | POA: Diagnosis not present

## 2018-06-20 ENCOUNTER — Other Ambulatory Visit: Payer: Self-pay | Admitting: Internal Medicine

## 2018-06-20 DIAGNOSIS — Z1231 Encounter for screening mammogram for malignant neoplasm of breast: Secondary | ICD-10-CM

## 2018-06-28 ENCOUNTER — Encounter: Payer: Self-pay | Admitting: Cardiovascular Disease

## 2018-07-02 ENCOUNTER — Ambulatory Visit (INDEPENDENT_AMBULATORY_CARE_PROVIDER_SITE_OTHER): Payer: PPO | Admitting: Cardiovascular Disease

## 2018-07-02 ENCOUNTER — Encounter: Payer: Self-pay | Admitting: Cardiovascular Disease

## 2018-07-02 VITALS — BP 170/88 | HR 63 | Ht 64.0 in | Wt 142.8 lb

## 2018-07-02 DIAGNOSIS — I10 Essential (primary) hypertension: Secondary | ICD-10-CM

## 2018-07-02 DIAGNOSIS — I359 Nonrheumatic aortic valve disorder, unspecified: Secondary | ICD-10-CM | POA: Diagnosis not present

## 2018-07-02 DIAGNOSIS — I712 Thoracic aortic aneurysm, without rupture, unspecified: Secondary | ICD-10-CM

## 2018-07-02 NOTE — Addendum Note (Signed)
Addended by: Mendel Ryder on: 07/02/2018 05:00 PM   Modules accepted: Orders

## 2018-07-02 NOTE — Patient Instructions (Addendum)
Medication Instructions: Your physician recommends that you continue on your current medications as directed. Please refer to the Current Medication list given to you today.   Labwork: None  Procedures/Testing: Your physician has requested that you have an echocardiogram. Echocardiography is a painless test that uses sound waves to create images of your heart. It provides your doctor with information about the size and shape of your heart and how well your heart's chambers and valves are working. This procedure takes approximately one hour. There are no restrictions for this procedure.    Follow-Up: Your physician wants you to follow-up in 1 year with Dr. Shirley Friar will receive a reminder letter in the mail two months in advance. If you don't receive a letter, please call our office to schedule the follow-up appointment.   Any Additional Special Instructions Will Be Listed Below (If Applicable).     If you need a refill on your cardiac medications before your next appointment, please call your pharmacy.

## 2018-07-02 NOTE — Progress Notes (Signed)
Chief Complaint  Patient presents with  . Follow-up    HTN    History of Present Illness: 72 yo female with history of HTN and thoracic aortic aneurysm who is here today for cardiac follow up. I met her in January 2018 to discuss a cardiac cath which was required as part of her pre-operative workup before planned replacement of her aorta. Cardiac cath 10/11/16 with no evidence of CAD. She underwent replacement of the aortic arch with aorto-innominate and aorto-left carotid bypass with Bentall procedure using Medtronic 21 mm Freestyle porcine root on 10/20/16. She then underwent left carotid to left subclavian bypass with endovascular repair of the descending thoracic aortic aneurysm in November 2018.   She is here today for follow up. The patient denies any chest pain, dyspnea, palpitations, lower extremity edema, orthopnea, PND, dizziness, near syncope or syncope. Her BP has been well controlled at home.    Primary Care Physician: Haywood Pao, MD   Past Medical History:  Diagnosis Date  . Blood transfusion without reported diagnosis 2009   anemia  . Hypertension   . Pneumonia    hx recent  . Thoracic aortic aneurysm without rupture [facility])     Past Surgical History:  Procedure Laterality Date  . BENTALL PROCEDURE N/A 10/20/2016   Procedure: BIOLOGIC BENTALL PROCEDURE -21 mm Medtronic Freestyle Porcine Valve Conduit;  Surgeon: Gaye Pollack, MD;  Location: [place];  Service: Open Heart Surgery;  Laterality: N/A;  RIGHT AXILLARY ARTERY CANNULATION  BILATERAL RADIAL ARTERIAL LINES  . CARDIAC CATHETERIZATION N/A 10/11/2016   Procedure: Right/Left Heart Cath and Coronary Angiography;  Surgeon: Burnell Blanks, MD;  Location: [place] CV LAB;  Service: Cardiovascular;  Laterality: N/A;  . CAROTID-SUBCLAVIAN BYPASS GRAFT Left 08/17/2017   Procedure: BYPASS HEMASHIELD GOLD GRAFT 41m x 30cm CAROTID-SUBCLAVIAN;  Surgeon: BSerafina Mitchell MD;  [place] [facility]  Service: Vascular;   Laterality: Left;  . COLONOSCOPY    . REPLACEMENT ASCENDING AORTA N/A 10/20/2016   Procedure: REPLACEMENT ASCENDING AORTA WITH ARCH RECONSTRUCTION-Aorto-Inominate bypass, Aorto-Carotid Bypass.. With Hypothermic circulatory arrest;  Surgeon: BGaye Pollack MD;  [place] [facility]  Service: Open Heart Surgery;  Laterality: N/A;  . TEE WITHOUT CARDIOVERSION N/A 10/20/2016   Procedure: TRANSESOPHAGEAL ECHOCARDIOGRAM (TEE);  Surgeon: BGaye Pollack MD;  Location[place] [facility]  Service: Open Heart Surgery;  Laterality: N/A;  . THORACIC AORTIC ENDOVASCULAR STENT GRAFT N/A 08/17/2017   Procedure: THORACIC AORTIC ENDOVASCULAR STENT GRAFT;  Surgeon: BSerafina Mitchell MD;  Location[place] [facility]  Service: Vascular;  Laterality: N/A;  . TUBAL LIGATION  1976    Current Outpatient Medications  Medication Sig Dispense Refill  . aspirin EC 81 MG tablet Take 81 mg by mouth daily.    .Marland Kitchenatorvastatin (LIPITOR) 10 MG tablet Take 1 tablet (10 mg total) by mouth daily. 90 tablet 3  . metoprolol tartrate (LOPRESSOR) 50 MG tablet Take 1 tablet (50 mg total) by mouth 2 (two) times daily. 180 tablet 3  . olmesartan (BENICAR) 40 MG tablet Take 1 tablet (40 mg total) by mouth daily. 90 tablet 3   No current facility-administered medications for this visit.     Allergies  Allergen Reactions  . Codeine Other (See Comments)    hallucinations  . Latex Rash    Social History   Socioeconomic History  . Marital status: Married    Spouse name: Not on file  . Number of children: 1  . Years of education: Not on file  .  Highest education level: Not on file  Occupational History  . Occupation: Semi Retired-housework  Social Needs  . Financial resource strain: Not on file  . Food insecurity:    Worry: Not on file    Inability: Not on file  . Transportation needs:    Medical: Not on file    Non-medical: Not on file  Tobacco Use  . Smoking status: Former Smoker    Packs/day: 0.50    Years: 40.00    Pack years: 20.00     Types: Cigarettes    Start date: 10/06/1968    Last attempt to quit: 06/22/2008    Years since quitting: 10.0  . Smokeless tobacco: Never Used  Substance and Sexual Activity  . Alcohol use: No  . Drug use: No  . Sexual activity: Not on file  Lifestyle  . Physical activity:    Days per week: Not on file    Minutes per session: Not on file  . Stress: Not on file  Relationships  . Social connections:    Talks on phone: Not on file    Gets together: Not on file    Attends religious service: Not on file    Active member of club or organization: Not on file    Attends meetings of clubs or organizations: Not on file    Relationship status: Not on file  . Intimate partner violence:    Fear of current or ex partner: Not on file    Emotionally abused: Not on file    Physically abused: Not on file    Forced sexual activity: Not on file  Other Topics Concern  . Not on file  Social History Narrative  . Not on file    Family History  Problem Relation Age of Onset  . Colon cancer Neg Hx     Review of Systems:  As stated in the HPI and otherwise negative.   BP (!) 170/88   Pulse 63   Ht _0  (1.626 m)   Wt 142 lb 12.8 oz (64.8 kg)   SpO2 98%   BMI 24.51 kg/m   Physical Examination:  General: Well developed, well nourished, NAD  HEENT: OP clear, mucus membranes moist  SKIN: warm, dry. No rashes. Neuro: No focal deficits  Musculoskeletal: Muscle strength 5/5 all ext  Psychiatric: Mood and affect normal  Neck: No JVD, no carotid bruits, no thyromegaly, no lymphadenopathy.  Lungs:Clear bilaterally, no wheezes, rhonci, crackles Cardiovascular: Regular rate and rhythm. No murmurs, gallops or rubs. Abdomen:Soft. Bowel sounds present. Non-tender.  Extremities: No lower extremity edema. Pulses are 2 + in the bilateral DP/PT.  CTA chest 09/28/16: 1. Interval aneurysmal dilatation of the entirety of the thoracic aorta with the ascending thoracic aorta measuring 62 mm in  diameter, previously, 38 mm. No evidence of thoracic aortic dissection or periaortic stranding. 2. The thoracic aortic aneurysm extends to involve the suprarenal abdominal aorta, measuring approximately 3.3 cm between the takeoff of the SMA and celiac arteries. The abdominal aorta tapers to a normal caliber below the level of the renal arteries. 3. Coronary artery calcifications. Aortic Atherosclerosis (ICD10-170.0)  Echo 09/21/16: Left ventricle: The cavity size was normal. Wall thickness was   increased in a pattern of mild LVH. Systolic function was normal.   The estimated ejection fraction was in the range of 60% to 65%.   Wall motion was normal; there were no regional wall motion   abnormalities. Doppler parameters are consistent with abnormal   left ventricular  relaxation (grade 1 diastolic dysfunction). The   E/e&' ratio is between 8-15, suggesting indeterminate LV filling   pressure. - Aortic valve: Trileaflet. There was moderate regurgitation.   Regurgitation pressure half-time: 342 ms. - Aorta: Severely dilated ascending aorta up to 6.2 cm. No evidence   for dissection. The proximal descending aorta measures 4.4 cm.   The mid-descending aorta measures 3.1 cm. - Mitral valve: Mildly thickened leaflets . There was mild   regurgitation. - Left atrium: The atrium was normal in size. - Tricuspid valve: There was trivial regurgitation. - Pulmonic valve: There was mild regurgitation. - Pulmonary arteries: PA peak pressure: 22 mm Hg (S). - Inferior vena cava: The IVC measures <2.1 cm, but does not   collapse >50%, suggesting an elevated RA pressure of 8 mmHg.  Impressions:  - LVEF 60-65%, mild LVH, normal wall motion, diastolic dyfunction,   indeterminate LV filling pressure, severely dilated ascending   aorta up to 6.2 cm, the proximal descending aorta measures 4.4 cm   and the mid-descending aorta measures 3.1 cm, normal LA size,   trivial TR, mild PI, normal RVSP,  elevated RA pressure of 8   mmHg,. no pericardial effusion. The patient reports she has been   seen by Dr. Cyndia Bent with CT surgery and has a follow-up next week   - findings are similar to that seen by CT of the neck.  EKG:  EKG is ordered today. The ekg ordered today demonstrates NSR, rate 63 bpm. RBBB  Recent Labs: 08/11/2017: ALT 16 08/17/2017: Magnesium 1.6 08/18/2017: BUN 12; Creatinine, Ser 0.82; Hemoglobin 10.6; Platelets 145; Potassium 3.9; Sodium 137   Lipid Panel No results found for: CHOL, TRIG, HDL, CHOLHDL, VLDL, LDLCALC, LDLDIRECT   Wt Readings from Last 3 Encounters:  07/02/18 142 lb 12.8 oz (64.8 kg)  04/09/18 140 lb 4.8 oz (63.6 kg)  09/18/17 140 lb (63.5 kg)     Other studies Reviewed: Additional studies/ records that were reviewed today include: . Review of the above records demonstrates:    Assessment and Plan:   1. Thoracic aortic aneurysm She is s/p aortic arch replacement with Bentall procedure and porcine aortic valve replacement in January 2018. She is doing well. Continue ASA and beta blocker.     2. HTN: BP has been well controlled at home. She has white coat hypertension. Her BP is elevated here in our office today. NO changes  3. Aortic valve disease: She is s/p AVR with a bioprosthetic AVR (Bentall procedure). There is a systolic murmur which is expected post AVR. Will arrange echo today to assess bioprosthetic valve.   Current medicines are reviewed at length with the patient today.  The patient does not have concerns regarding medicines.  The following changes have been made:  no change  Labs/ tests ordered today include:   Orders Placed This Encounter  Procedures  . ECHOCARDIOGRAM COMPLETE    Disposition:   FU with me in one year.   Signed, Lauree Chandler, MD 07/02/2018 2:33 PM    [address], [place], [place]  [address] Phone: [phone]; Fax: [phone]

## 2018-07-06 ENCOUNTER — Other Ambulatory Visit: Payer: Self-pay

## 2018-07-06 ENCOUNTER — Ambulatory Visit (HOSPITAL_COMMUNITY): Payer: PPO | Attending: Cardiology

## 2018-07-06 DIAGNOSIS — I712 Thoracic aortic aneurysm, without rupture, unspecified: Secondary | ICD-10-CM

## 2018-07-06 DIAGNOSIS — I878 Other specified disorders of veins: Secondary | ICD-10-CM | POA: Insufficient documentation

## 2018-07-06 DIAGNOSIS — Z953 Presence of xenogenic heart valve: Secondary | ICD-10-CM | POA: Insufficient documentation

## 2018-07-06 DIAGNOSIS — Z87891 Personal history of nicotine dependence: Secondary | ICD-10-CM | POA: Diagnosis not present

## 2018-07-06 DIAGNOSIS — I088 Other rheumatic multiple valve diseases: Secondary | ICD-10-CM | POA: Diagnosis not present

## 2018-07-06 DIAGNOSIS — I081 Rheumatic disorders of both mitral and tricuspid valves: Secondary | ICD-10-CM | POA: Diagnosis not present

## 2018-07-06 DIAGNOSIS — I119 Hypertensive heart disease without heart failure: Secondary | ICD-10-CM | POA: Insufficient documentation

## 2018-07-23 DIAGNOSIS — H40013 Open angle with borderline findings, low risk, bilateral: Secondary | ICD-10-CM | POA: Diagnosis not present

## 2018-07-25 ENCOUNTER — Ambulatory Visit
Admission: RE | Admit: 2018-07-25 | Discharge: 2018-07-25 | Disposition: A | Discharge status: Home / Self Care | Payer: PPO | Source: Ambulatory Visit | Attending: Internal Medicine | Admitting: Internal Medicine

## 2018-07-25 DIAGNOSIS — Z1231 Encounter for screening mammogram for malignant neoplasm of breast: Secondary | ICD-10-CM

## 2018-08-05 IMAGING — CT CT NECK W/ CM
4 of 5 series · 14 of 33 positions shown, 16 images · IV contrast (agent unspecified)
Comparison: Neck ultrasound 08/19/2016

CLINICAL DATA: Right anterior neck mass.

EXAM:
CT NECK WITH CONTRAST
TECHNIQUE: Multidetector CT imaging of the neck was performed using the
standard protocol following the bolus administration of intravenous
contrast.
CONTRAST:  75 mL Usovue-WNN

[Series 2: neck · axial · 0.40mm/px · z∈[-197,-47]mm · 4 of 126 slices shown, 5 images]
[im 26/126  soft-tissue]
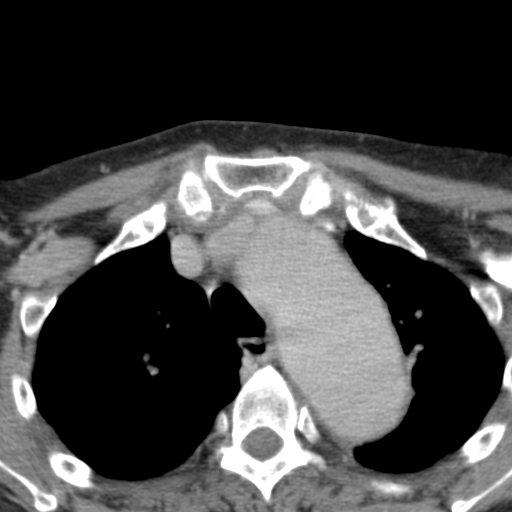
[im 26/126  bone]
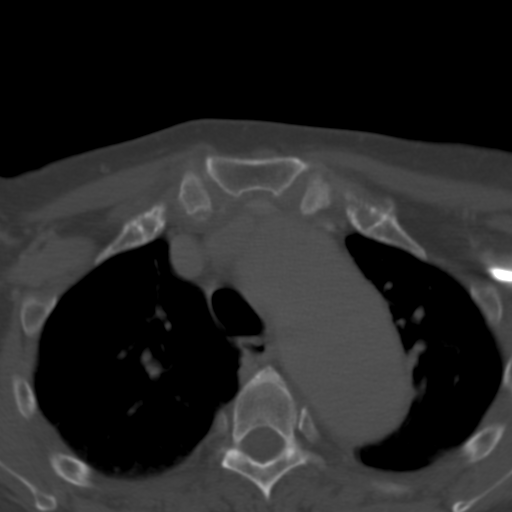
[im 51/126  bone]
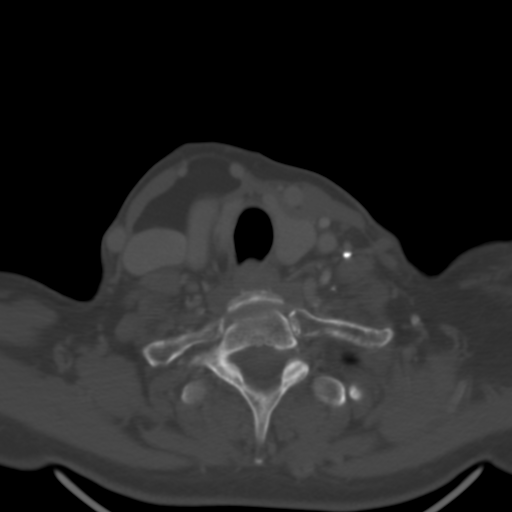
[im 76/126  bone]
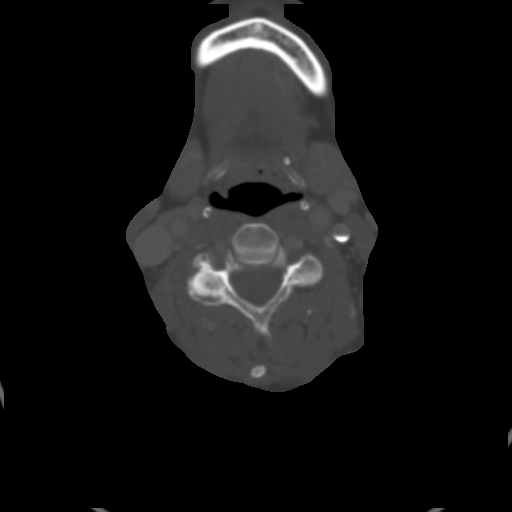
[im 101/126  bone]
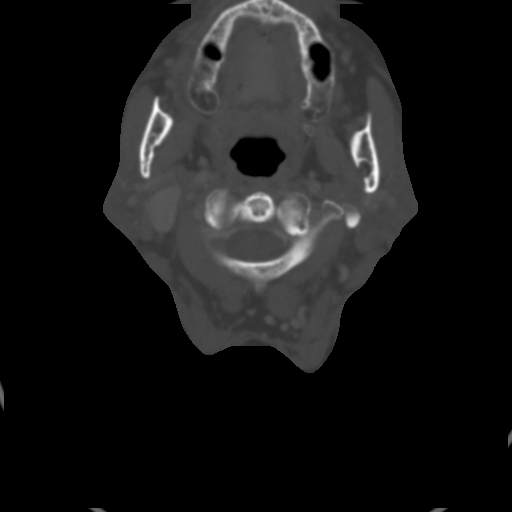

[Series 5: sag · sagittal · 0.38mm/px · 5 of 101 slices shown, 6 images]
[im 34/101  bone]
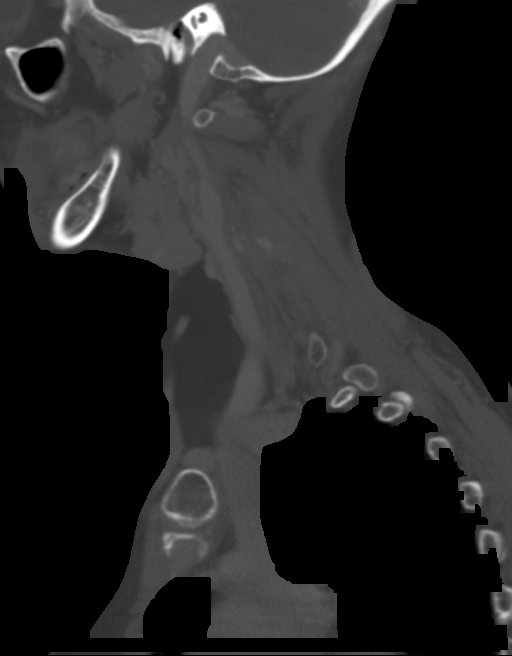
[im 42/101  bone]
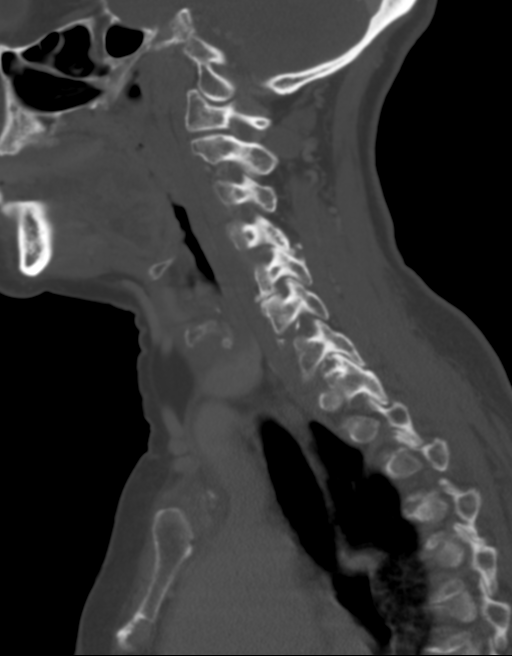
[im 51/101  soft-tissue]
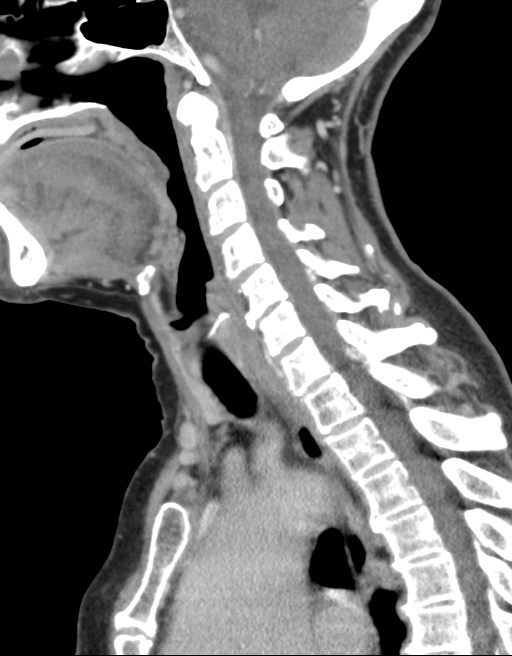
[im 51/101  bone]
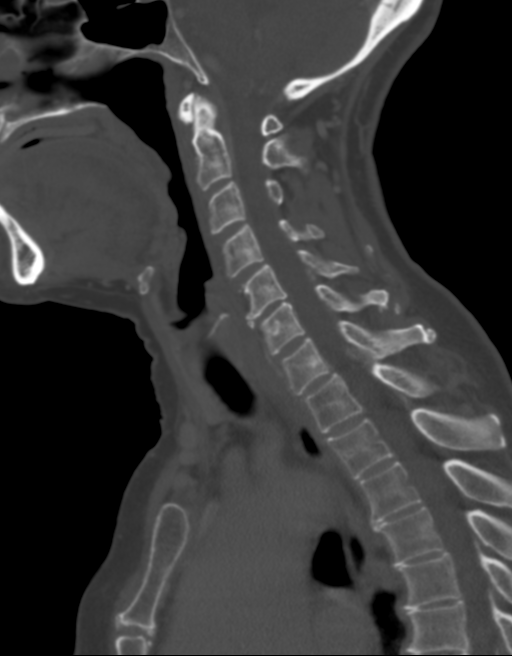
[im 59/101  bone]
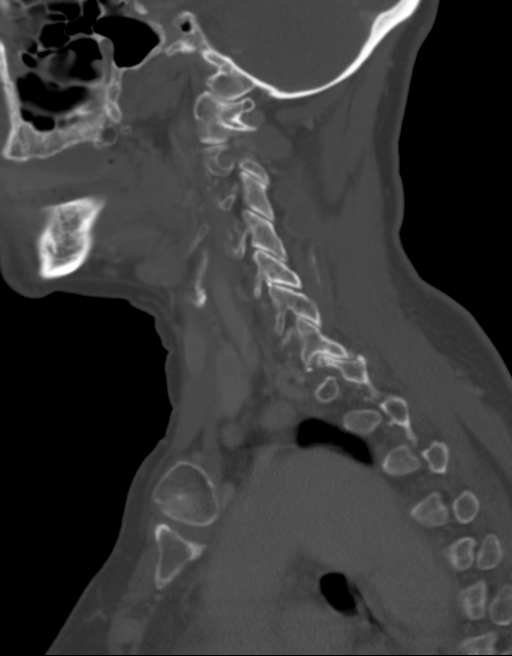
[im 67/101  bone]
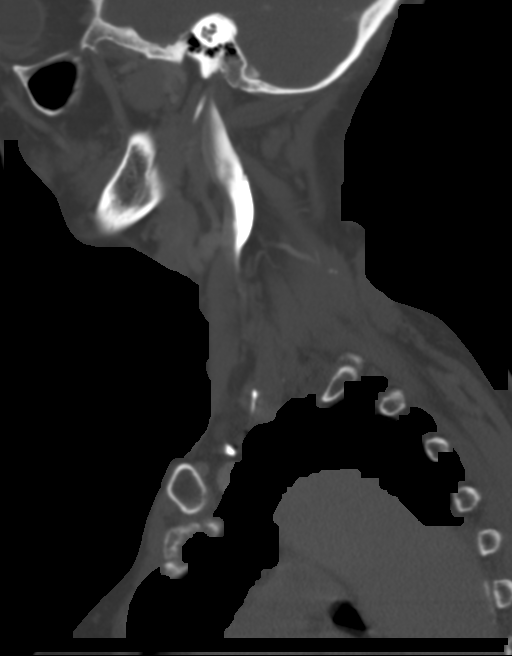

[Series 6: cor · coronal · 0.40mm/px · 3 of 100 slices shown]
[im 30/100  bone]
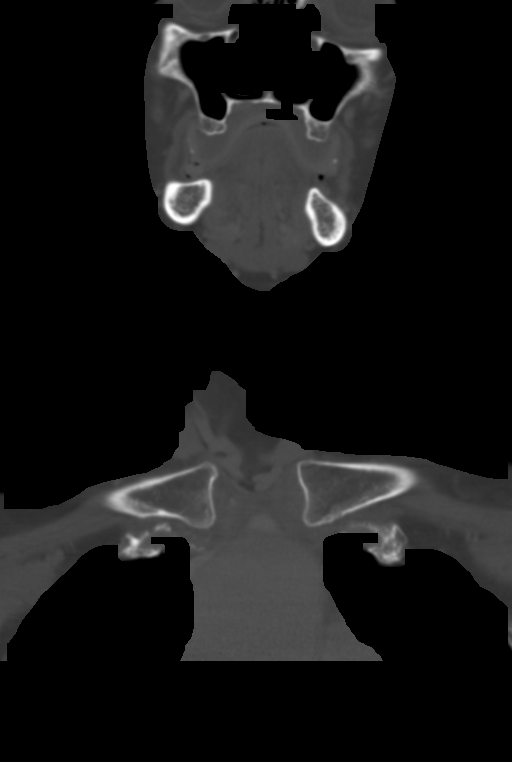
[im 43/100  bone]
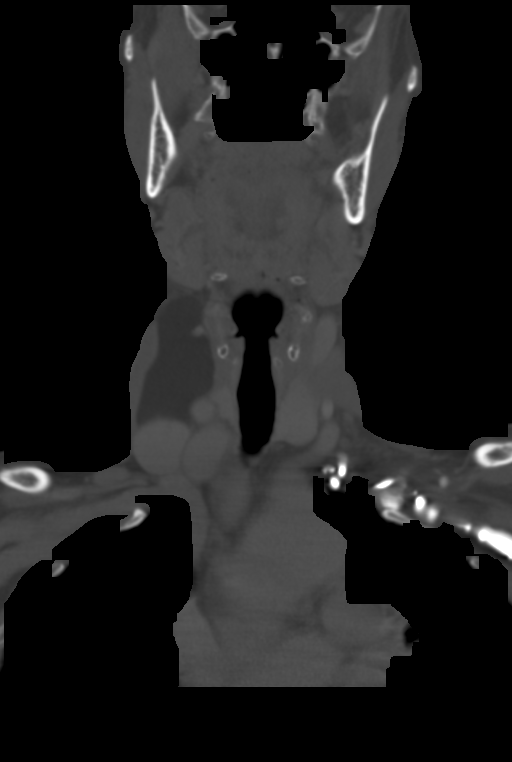
[im 57/100  bone]
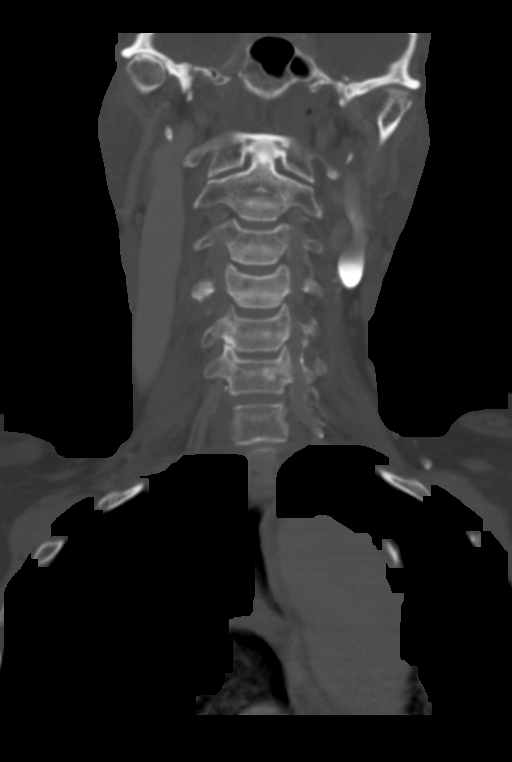

[Series 7: angled axial-oropharynx · axial · 0.39mm/px · z∈[-235,-184]mm · 2 of 142 slices shown]
[im 29/142  bone]
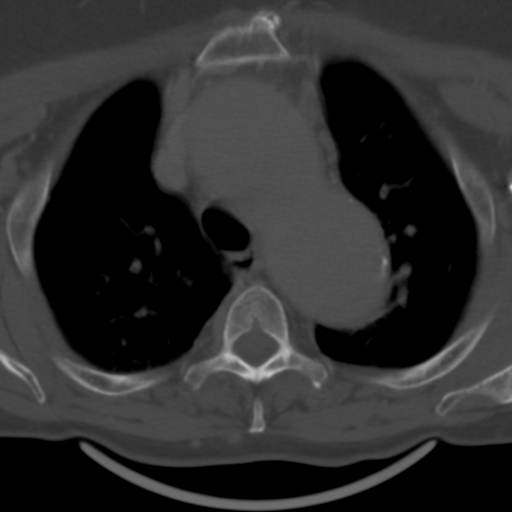
[im 57/142  bone]
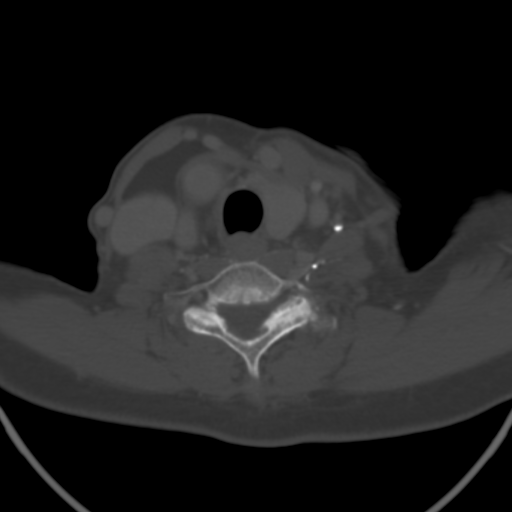

[14 of 33 positions shown; findings below may reference images not displayed]

FINDINGS: Pharynx and larynx: No pharyngeal mass or inflammatory change.
Unremarkable larynx.

Salivary glands: No inflammation, mass, or stone.

Thyroid: Unremarkable.

Lymph nodes: No enlarged lymph nodes are identified in the neck.

Vascular: Major vascular structures of the neck appear patent.

Limited intracranial: Unremarkable.

Visualized orbits: Unremarkable.

Mastoids and visualized paranasal sinuses: Clear.

Skeleton: Advanced right facet arthrosis at C4-5 with trace
anterolisthesis. Mild cervical disc degeneration greatest at C5-6.

Upper chest: Partially visualized thoracic aortic aneurysm. The
ascending aorta measures at least 6.1 cm in diameter. The transverse
aorta measures up to 4.5 cm, and the proximal descending aorta
measures 4.5 cm.

Other: Corresponding to the palpable abnormality in the right neck
is a homogeneous fat density mass. This measures 5.0 x 2.9 x 7.2 cm
and is located deep/medial to the right sternocleidomastoid muscle
and anterior to the right thyroid lobe and carotid space vessels.
There is mild mass effect on the adjacent structures. No enhancing
mass component is seen. A vein courses through the mass.
IMPRESSION: 1. 7.2 cm right neck lipoma.
2. At least 6.1 cm ascending aortic aneurysm, incompletely
visualized. Recommend semi-annual imaging followup by CTA or MRA and
referral to cardiothoracic surgery if not already obtained. This
recommendation follows 3323
ACCF/AHA/AATS/ACR/ASA/SCA/MICHALE/ERRA/AUJLA/SOGORB Guidelines for the
Diagnosis and Management of Patients With Thoracic Aortic Disease.
Circulation. 3323; 121: [id number]

## 2018-08-13 DIAGNOSIS — R82998 Other abnormal findings in urine: Secondary | ICD-10-CM | POA: Diagnosis not present

## 2018-08-13 DIAGNOSIS — I10 Essential (primary) hypertension: Secondary | ICD-10-CM | POA: Diagnosis not present

## 2018-08-13 DIAGNOSIS — E559 Vitamin D deficiency, unspecified: Secondary | ICD-10-CM | POA: Diagnosis not present

## 2018-08-16 DIAGNOSIS — Z95828 Presence of other vascular implants and grafts: Secondary | ICD-10-CM | POA: Insufficient documentation

## 2018-08-16 DIAGNOSIS — Z1389 Encounter for screening for other disorder: Secondary | ICD-10-CM | POA: Diagnosis not present

## 2018-08-16 DIAGNOSIS — E7800 Pure hypercholesterolemia, unspecified: Secondary | ICD-10-CM | POA: Diagnosis not present

## 2018-08-16 DIAGNOSIS — Z23 Encounter for immunization: Secondary | ICD-10-CM | POA: Diagnosis not present

## 2018-08-16 DIAGNOSIS — I10 Essential (primary) hypertension: Secondary | ICD-10-CM | POA: Diagnosis not present

## 2018-08-16 DIAGNOSIS — I6529 Occlusion and stenosis of unspecified carotid artery: Secondary | ICD-10-CM | POA: Insufficient documentation

## 2018-08-16 DIAGNOSIS — D692 Other nonthrombocytopenic purpura: Secondary | ICD-10-CM | POA: Diagnosis not present

## 2018-08-16 DIAGNOSIS — Z952 Presence of prosthetic heart valve: Secondary | ICD-10-CM | POA: Diagnosis not present

## 2018-08-16 DIAGNOSIS — M859 Disorder of bone density and structure, unspecified: Secondary | ICD-10-CM | POA: Diagnosis not present

## 2018-08-16 DIAGNOSIS — I712 Thoracic aortic aneurysm, without rupture: Secondary | ICD-10-CM | POA: Diagnosis not present

## 2018-08-16 DIAGNOSIS — Z6824 Body mass index (BMI) 24.0-24.9, adult: Secondary | ICD-10-CM | POA: Diagnosis not present

## 2018-08-16 DIAGNOSIS — Z0000 Encounter for general adult medical examination without abnormal findings: Secondary | ICD-10-CM | POA: Diagnosis not present

## 2018-08-16 DIAGNOSIS — E559 Vitamin D deficiency, unspecified: Secondary | ICD-10-CM | POA: Diagnosis not present

## 2018-08-17 DIAGNOSIS — Z1212 Encounter for screening for malignant neoplasm of rectum: Secondary | ICD-10-CM | POA: Diagnosis not present

## 2018-10-01 ENCOUNTER — Ambulatory Visit
Admission: RE | Admit: 2018-10-01 | Discharge: 2018-10-01 | Disposition: A | Discharge status: Home / Self Care | Payer: PPO | Source: Ambulatory Visit | Attending: Surgery | Admitting: Surgery

## 2018-10-01 DIAGNOSIS — I712 Thoracic aortic aneurysm, without rupture, unspecified: Secondary | ICD-10-CM

## 2018-10-01 DIAGNOSIS — I719 Aortic aneurysm of unspecified site, without rupture: Secondary | ICD-10-CM | POA: Diagnosis not present

## 2018-10-01 MED ORDER — IOPAMIDOL (ISOVUE-370) INJECTION 76%
75.0000 mL | Freq: Once | INTRAVENOUS | Status: AC | PRN
  Administered 2018-10-01: 75 mL via INTRAVENOUS

## 2018-10-22 ENCOUNTER — Ambulatory Visit: Payer: PPO | Admitting: Surgery

## 2018-10-22 ENCOUNTER — Encounter: Payer: Self-pay | Admitting: Surgery

## 2018-10-22 ENCOUNTER — Other Ambulatory Visit: Payer: Self-pay

## 2018-10-22 ENCOUNTER — Other Ambulatory Visit: Payer: Self-pay | Admitting: Surgery

## 2018-10-22 VITALS — BP 187/84 | HR 57 | Temp 98.4°F | Resp 16 | Ht 64.0 in | Wt 142.0 lb

## 2018-10-22 DIAGNOSIS — I712 Thoracic aortic aneurysm, without rupture, unspecified: Secondary | ICD-10-CM

## 2018-10-22 DIAGNOSIS — T82330A Leakage of aortic (bifurcation) graft (replacement), initial encounter: Secondary | ICD-10-CM

## 2018-10-22 DIAGNOSIS — IMO0002 Reserved for concepts with insufficient information to code with codable children: Secondary | ICD-10-CM

## 2018-10-22 NOTE — Progress Notes (Signed)
Vascular and Vein Specialist of [place]  Patient name: Nancy Montoya MRN: [id number] DOB: 28-Jan-1946 Sex: female   REASON FOR VISIT:    Follow up  Post [place]:    Nancy Montoya a 73 y.o.femalewho returns today for follow up.. She is status post left carotid to left subclavian bypass graft with a 7 mm dacryon graft as well as endovascular repair of a 5.1 descending thoracic aortic aneurysm on 09-18-2017. Her postoperative course was uncomplicated. She is back today for follow-up.  She has a known type II endoleak.  The patient has a history of hypertension which has been difficult to control.  She is a former smoker.  She is on a statin for hypercholesterolemia.  She has a history of vertigo but has not had an episode in a while.  This did flareup after her operation.  PAST MEDICAL HISTORY:   Past Medical History:  Diagnosis Date  . Blood transfusion without reported diagnosis 2009   anemia  . Hypertension   . Pneumonia    hx recent  . Thoracic aortic aneurysm without rupture ([place])      FAMILY HISTORY:   Family History  Problem Relation Age of Onset  . Colon cancer Neg Hx     SOCIAL HISTORY:   Social History   Tobacco Use  . Smoking status: Former Smoker    Packs/day: 0.50    Years: 40.00    Pack years: 20.00    Types: Cigarettes    Start date: 10/06/1968    Last attempt to quit: 06/22/2008    Years since quitting: 10.3  . Smokeless tobacco: Never Used  Substance Use Topics  . Alcohol use: No     ALLERGIES:   Allergies  Allergen Reactions  . Codeine Other (See Comments)    hallucinations  . Latex Rash     CURRENT MEDICATIONS:   Current Outpatient Medications  Medication Sig Dispense Refill  . aspirin EC 81 MG tablet Take 81 mg by mouth daily.    Marland Kitchen atorvastatin (LIPITOR) 10 MG tablet Take 1 tablet (10 mg total) by mouth daily. (Patient taking differently: Take 20 mg by mouth daily. ) 90  tablet 3  . metoprolol tartrate (LOPRESSOR) 50 MG tablet Take 1 tablet (50 mg total) by mouth 2 (two) times daily. 180 tablet 3  . olmesartan (BENICAR) 40 MG tablet Take 1 tablet (40 mg total) by mouth daily. 90 tablet 3   No current facility-administered medications for this visit.     REVIEW OF SYSTEMS:   [X]  denotes positive finding, [ ]  denotes negative finding Cardiac  Comments:  Chest pain or chest pressure:    Shortness of breath upon exertion:    Short of breath when lying flat:    Irregular heart rhythm:        Vascular    Pain in calf, thigh, or hip brought on by ambulation:    Pain in feet at night that wakes you up from your sleep:     Blood clot in your veins:    Leg swelling:         Pulmonary    Oxygen at home:    Productive cough:     Wheezing:         Neurologic    Sudden weakness in arms or legs:     Sudden numbness in arms or legs:     Sudden onset of difficulty speaking or slurred speech:    Temporary loss of  vision in one eye:     Problems with dizziness:         Gastrointestinal    Blood in stool:     Vomited blood:         Genitourinary    Burning when urinating:     Blood in urine:        Psychiatric    Major depression:         Hematologic    Bleeding problems:    Problems with blood clotting too easily:        Skin    Rashes or ulcers:        Constitutional    Fever or chills:      PHYSICAL EXAM:   Vitals:   10/22/18 1049 10/22/18 1053  BP: (!) 194/89 (!) 187/84  Pulse: (!) 57   Resp: 16   Temp: 98.4 F (36.9 C)   TempSrc: Oral   SpO2: 97%   Weight: 142 lb (64.4 kg)   Height: 5\' 4"  (1.626 m)     GENERAL: The patient is a well-nourished female, in no acute distress. The vital signs are documented above. CARDIAC: There is a regular rate and rhythm.  PULMONARY: Non-labored respirations.  MUSCULOSKELETAL: There are no major deformities or cyanosis. NEUROLOGIC: No focal weakness or paresthesias are detected. SKIN: There  are no ulcers or rashes noted. PSYCHIATRIC: The patient has a normal affect.  STUDIES:   I have reviewed her CTA with the following findings:  Redemonstration of hybrid surgical and endovascular repair of ascending and arch aneurysm, with reimplantation of innominate artery, left common carotid artery, left carotid subclavian bypass, and stent graft from the left carotid ostium to just above the celiac artery origin.  Redemonstration of a type 2 endoleak of a segmental artery at the level of T4, with the current CT demonstrating slight interval growth of the excluded aneurysm sac from a prior diameter of 5.5 cm, to a current diameter of approximately 5.8 cm-5.9 cm. Diameter at the hiatus has not changed.  Redemonstration of mild coronary atherosclerosis.  MEDICAL ISSUES:   Status post TEVAR: The patient has a known type II endoleak from a segmental artery.  She continues to have expansion of her aneurysm sac.  Maximum diameter today is 5.9 cm.  At this point, I think it is necessary to refer her to interventional radiology for further discussions regarding embolization of her endoleak.  I have her scheduled follow-up with me in 6 months.    Annamarie Major, MD Vascular and Vein Specialists of [facility] [phone] Pager [phone]

## 2018-10-28 ENCOUNTER — Other Ambulatory Visit: Payer: Self-pay | Admitting: Cardiovascular Disease

## 2018-11-05 ENCOUNTER — Other Ambulatory Visit: Payer: Self-pay | Admitting: Cardiovascular Disease

## 2018-11-08 ENCOUNTER — Encounter: Payer: Self-pay | Admitting: *Deleted

## 2018-11-08 ENCOUNTER — Telehealth: Payer: Self-pay | Admitting: Cardiovascular Disease

## 2018-11-08 ENCOUNTER — Ambulatory Visit
Admission: RE | Admit: 2018-11-08 | Discharge: 2018-11-08 | Disposition: A | Discharge status: Home / Self Care | Payer: PPO | Source: Ambulatory Visit | Attending: Surgery | Admitting: Surgery

## 2018-11-08 DIAGNOSIS — T82330A Leakage of aortic (bifurcation) graft (replacement), initial encounter: Secondary | ICD-10-CM

## 2018-11-08 DIAGNOSIS — IMO0002 Reserved for concepts with insufficient information to code with codable children: Secondary | ICD-10-CM

## 2018-11-08 HISTORY — PX: IR RADIOLOGIST EVAL & MGMT: IMG5224

## 2018-11-08 NOTE — Consult Note (Signed)
Chief Complaint: Patient was seen in consultation today for  Chief Complaint  Patient presents with  . Consult    Consult for Type 2 Endoleak Leak post EVAR of Thoracic Aortic Aneurysm    at the request of Brabham,Vance W  Referring Physician(s): Brabham,Vance W  History of Present Illness: Nancy Montoya is a 73 y.o. female with a history of aortic valvular and thoracic aortic aneurysmal disease status post combined open surgical and endovascular aortic repair including aortic valve replacement, ascending aorta and arch replacement with reconstruction and reimplantation of the innominate artery as well as open carotid subclavian bypass followed by thoracic endovascular aortic repair using a Gore endoprosthesis.  She has done exceptionally well, however has an asymptomatic type II endoleak arising from a bronchial artery with evidence of enlargement of the native sac over the past 6 months.  She is currently asymptomatic and feeling great.  She denies chest pain, shortness of breath, upper extremity claudication, weakness or other symptoms.  Past Medical History:  Diagnosis Date  . Blood transfusion without reported diagnosis 2009   anemia  . Hypertension   . Pneumonia    hx recent  . Thoracic aortic aneurysm without rupture [facility])     Past Surgical History:  Procedure Laterality Date  . BENTALL PROCEDURE N/A 10/20/2016   Procedure: BIOLOGIC BENTALL PROCEDURE -21 mm Medtronic Freestyle Porcine Valve Conduit;  Surgeon: Gaye Pollack, MD;  Location: [place];  Service: Open Heart Surgery;  Laterality: N/A;  RIGHT AXILLARY ARTERY CANNULATION  BILATERAL RADIAL ARTERIAL LINES  . CARDIAC CATHETERIZATION N/A 10/11/2016   Procedure: Right/Left Heart Cath and Coronary Angiography;  Surgeon: Burnell Blanks, MD;  Location: [place] [facility];  Service: Cardiovascular;  Laterality: N/A;  . CAROTID-SUBCLAVIAN BYPASS GRAFT Left 08/17/2017   Procedure: BYPASS HEMASHIELD GOLD GRAFT 51mm  x 30cm CAROTID-SUBCLAVIAN;  Surgeon: Serafina Mitchell, MD;  Location: [place];  Service: Vascular;  Laterality: Left;  . COLONOSCOPY    . REPLACEMENT ASCENDING AORTA N/A 10/20/2016   Procedure: REPLACEMENT ASCENDING AORTA WITH ARCH RECONSTRUCTION-Aorto-Inominate bypass, Aorto-Carotid Bypass.. With Hypothermic circulatory arrest;  Surgeon: Gaye Pollack, MD;  Location: [place];  Service: Open Heart Surgery;  Laterality: N/A;  . TEE WITHOUT CARDIOVERSION N/A 10/20/2016   Procedure: TRANSESOPHAGEAL ECHOCARDIOGRAM (TEE);  Surgeon: Gaye Pollack, MD;  Location: [place];  Service: Open Heart Surgery;  Laterality: N/A;  . THORACIC AORTIC ENDOVASCULAR STENT GRAFT N/A 08/17/2017   Procedure: THORACIC AORTIC ENDOVASCULAR STENT GRAFT;  Surgeon: Serafina Mitchell, MD;  Location: [facility];  Service: Vascular;  Laterality: N/A;  . TUBAL LIGATION  1976    Allergies: Codeine and Latex  Medications: Prior to Admission medications   Medication Sig Start Date End Date Taking? Authorizing Provider  aspirin EC 81 MG tablet Take 81 mg by mouth daily.   Yes [provider]  atorvastatin (LIPITOR) 10 MG tablet Take 1 tablet (10 mg total) by mouth daily. Patient taking differently: Take 20 mg by mouth daily.  10/12/17  Yes Burnell Blanks, MD  metoprolol tartrate (LOPRESSOR) 50 MG tablet TAKE 1 TABLET BY MOUTH TWICE A DAY 10/30/18  Yes Burnell Blanks, MD  olmesartan (BENICAR) 40 MG tablet TAKE 1 TABLET BY MOUTH EVERY DAY 11/05/18  Yes Burnell Blanks, MD     Family History  Problem Relation Age of Onset  . Colon cancer Neg Hx     Social History   Socioeconomic History  . Marital status: Married  Spouse name: Not on file  . Number of children: 1  . Years of education: Not on file  . Highest education level: Not on file  Occupational History  . Occupation: Semi Retired-housework  Social Needs  . Financial resource strain: Not on file  . Food insecurity:    Worry: Not on file     Inability: Not on file  . Transportation needs:    Medical: Not on file    Non-medical: Not on file  Tobacco Use  . Smoking status: Former Smoker    Packs/day: 0.50    Years: 40.00    Pack years: 20.00    Types: Cigarettes    Start date: 10/06/1968    Last attempt to quit: 06/22/2008    Years since quitting: 10.3  . Smokeless tobacco: Never Used  Substance and Sexual Activity  . Alcohol use: No  . Drug use: No  . Sexual activity: Not on file  Lifestyle  . Physical activity:    Days per week: Not on file    Minutes per session: Not on file  . Stress: Not on file  Relationships  . Social connections:    Talks on phone: Not on file    Gets together: Not on file    Attends religious service: Not on file    Active member of club or organization: Not on file    Attends meetings of clubs or organizations: Not on file    Relationship status: Not on file  Other Topics Concern  . Not on file  Social History Narrative  . Not on file    Review of Systems: A 12 point ROS discussed and pertinent positives are indicated in the HPI above.  All other systems are negative.  Review of Systems  Vital Signs: BP (!) 193/77   Pulse 66   Temp 98.2 F (36.8 C) (Oral)   Resp 15   Ht 5\' 4"  (1.626 m)   Wt 63.5 kg   SpO2 97%   BMI 24.03 kg/m   Physical Exam Vitals signs and nursing note reviewed.  Constitutional:      General: She is not in acute distress.    Appearance: Normal appearance.  HENT:     Head: Normocephalic and atraumatic.  Eyes:     General: No scleral icterus. Cardiovascular:     Rate and Rhythm: Normal rate.  Pulmonary:     Effort: Pulmonary effort is normal.  Abdominal:     General: Abdomen is flat.     Palpations: Abdomen is soft.  Skin:    General: Skin is warm and dry.  Neurological:     Mental Status: She is alert and oriented to person, place, and time.  Psychiatric:        Mood and Affect: Mood normal.        Behavior: Behavior normal.      Imaging: No results found.  Labs:  CBC: No results for input(s): WBC, HGB, HCT, PLT in the last 8760 hours.  COAGS: No results for input(s): INR, APTT in the last 8760 hours.  BMP: No results for input(s): NA, K, CL, CO2, GLUCOSE, BUN, CALCIUM, CREATININE, GFRNONAA, GFRAA in the last 8760 hours.  Invalid input(s): CMP  LIVER FUNCTION TESTS: No results for input(s): BILITOT, AST, ALT, ALKPHOS, PROT, ALBUMIN in the last 8760 hours.  TUMOR MARKERS: No results for input(s): AFPTM, CEA, CA199, CHROMGRNA in the last 8760 hours.  Assessment and Plan:  Nancy Montoya is an extremely pleasant 73 year old  female with a history of thoracic aortic aneurysmal disease status post TEVAR which is complicated by a persistent type II endoleak which appears to be arising from 1 of the bronchial arteries.  Unfortunately, she has experienced some enlargement of the native sac over the past 6 months.    Her anatomy appears to be amenable to a direct sac puncture repair of this type II endoleak.  We should be able to occlude the space and hopefully the origin of the bronchial artery with a combination of liquid embolic (Onyx) and potentially coils.  We discussed the risks, benefits and alternatives to proceeding with treatment versus continued observation.  I was able to answer all of her questions.  At this time, she understands the procedure well and would like to proceed with treatment.  1.)  Please schedule for direct sac puncture type II endoleak repair with general anesthesia at [facility]. 2.)  I will ask my staff to check in with her cardiologist, Dr. Angelena Form for cardiac clearance.  Or procedures should be relatively short at 1-2 hours with no expected hemodynamic compromise outside of that associated with normal anesthesia.  Thank you for this interesting consult.  I greatly enjoyed meeting Nancy Montoya and look forward to participating in their care.  A copy of this report was  sent to the requesting provider on this date.  Electronically Signed: Jacqulynn Cadet 11/08/2018, 10:06 AM   I spent a total of  40 Minutes  in face to face in clinical consultation, greater than 50% of which was counseling/coordinating care for type 2 thoracic endoleak.

## 2018-11-08 NOTE — Telephone Encounter (Signed)
° ° °  ° °  [facility] Pre-operative Risk Assessment    Request for surgical clearance:  1. What type of surgery is being performed? Type 2 Endoleak Leak post EVAR of Thoracic Aortic Aneurysm   2. When is this surgery scheduled? TBD  3. What type of clearance is required (medical clearance vs. Pharmacy clearance to hold med vs. Both)? BOTH  4. Are there any medications that need to be held prior to surgery and how long?    Practice name and name of physician performing surgery? Jacqulynn Cadet 5.   6. What is your office phone number [phone]   7.   What is your office fax number [phone]  8.   Anesthesia type (None, local, MAC, general) ? [place] [facility] 11/08/2018, 11:35 AM  _________________________________________________________________   (provider comments below)

## 2018-11-08 NOTE — Progress Notes (Signed)
Pat, Can you check with her and make sure she is feeling ok? Last echo was ok. She has no CAD. I suspect she can move forward with her planned procedure if she is feeling well. Gerald Stabs

## 2018-11-12 NOTE — Telephone Encounter (Signed)
Although vascular surgery is considered high risk procedure, however patient has been quite stable based on phone conversation with me today. She says she has no problem complete 4 METS of activity such as climbing 2 flights of stairs or walk 2-4 blocks away from her house and back. Previous cath in 10/2016 showed clean coronaries. Last echo in 07/2018 showed stable EF and bioprosthetic aortic valve.   Dr. Angelena Form, ok with you to clear without further workup or return office visit?

## 2018-11-12 NOTE — Telephone Encounter (Signed)
Yes. I am ok with that. Thanks, chris

## 2018-11-12 NOTE — Telephone Encounter (Signed)
   Primary Cardiologist: Lauree Chandler, MD  Chart reviewed as part of pre-operative protocol coverage. Patient was contacted 11/12/2018 in reference to pre-operative risk assessment for pending surgery as outlined below.  Zasha Adiba Fargnoli was last seen on 07/02/2018 by Dr. Angelena Form.  Since that day, Kiyanna Makaley Storts has done well without any chest pain or shortness of breath. She is able to complete at least 4 METS of activity without any issue.   Therefore, based on ACC/AHA guidelines, the patient would be at acceptable risk for the planned procedure without further cardiovascular testing.   I will route this recommendation to the requesting party via Epic fax function and remove from pre-op pool.  Please call with questions. Case has been discussed with Dr. Angelena Form who is also agreeable with the clearance.   [place], [place] 11/12/2018, 3:21 PM

## 2018-11-15 ENCOUNTER — Other Ambulatory Visit (HOSPITAL_COMMUNITY): Payer: Self-pay | Admitting: Interventional Radiology

## 2018-11-15 DIAGNOSIS — I712 Thoracic aortic aneurysm, without rupture, unspecified: Secondary | ICD-10-CM

## 2018-11-26 ENCOUNTER — Ambulatory Visit (HOSPITAL_COMMUNITY): Payer: PPO

## 2018-11-26 ENCOUNTER — Ambulatory Visit (HOSPITAL_COMMUNITY): Admission: RE | Admit: 2018-11-26 | Payer: PPO | Source: Home / Self Care | Admitting: Interventional Radiology

## 2018-11-26 ENCOUNTER — Encounter (HOSPITAL_COMMUNITY): Admission: RE | Payer: Self-pay | Source: Home / Self Care

## 2018-11-26 SURGERY — IR WITH ANESTHESIA
Anesthesia: General

## 2018-12-26 ENCOUNTER — Telehealth (HOSPITAL_COMMUNITY): Payer: Self-pay | Admitting: Radiology

## 2018-12-26 NOTE — Telephone Encounter (Signed)
Called pt to let her know that I am still planning on scheduling her Type II Endoleak with Dr. Laurence Ferrari. Just wanted to update her as to why it cannot be scheduled this month as originally planned. Due to the COVID-19 we are not scheduling her procedure at present. I told the patient I would call her back in mid-April to let her know where we were in the process of rescheduling procedures. She states that she is happy to wait due to the current conditions and that she is staying home and inside in order to avoid this virus. She agrees with this plan of care. JM

## 2019-02-02 ENCOUNTER — Other Ambulatory Visit: Payer: Self-pay | Admitting: Cardiovascular Disease

## 2019-02-19 ENCOUNTER — Telehealth (HOSPITAL_COMMUNITY): Payer: Self-pay | Admitting: Radiology

## 2019-02-19 NOTE — Telephone Encounter (Signed)
Called pt to update and discuss scheduling her Type II Endoleak. I will call her back when I have a date that Laurence Ferrari will be at [facility] to set her up. She agrees with this plan of care. JM

## 2019-02-28 ENCOUNTER — Telehealth (HOSPITAL_COMMUNITY): Payer: Self-pay | Admitting: Radiology

## 2019-02-28 NOTE — Telephone Encounter (Signed)
Called pt, left VM for her to call to set up endoleak type II with Laurence Ferrari. JM

## 2019-03-05 ENCOUNTER — Other Ambulatory Visit (HOSPITAL_COMMUNITY)
Admission: RE | Admit: 2019-03-05 | Discharge: 2019-03-05 | Disposition: A | Discharge status: Home / Self Care | Payer: PPO | Source: Ambulatory Visit | Attending: Interventional Radiology | Admitting: Interventional Radiology

## 2019-03-05 ENCOUNTER — Other Ambulatory Visit: Payer: Self-pay

## 2019-03-05 DIAGNOSIS — Z1159 Encounter for screening for other viral diseases: Secondary | ICD-10-CM | POA: Insufficient documentation

## 2019-03-06 LAB — NOVEL CORONAVIRUS, NAA (HOSP ORDER, SEND-OUT TO REF LAB; TAT 18-24 HRS): SARS-CoV-2, NAA: NOT DETECTED

## 2019-03-07 ENCOUNTER — Other Ambulatory Visit: Payer: Self-pay

## 2019-03-07 ENCOUNTER — Other Ambulatory Visit: Payer: Self-pay | Admitting: Radiology

## 2019-03-07 ENCOUNTER — Encounter (HOSPITAL_COMMUNITY): Payer: Self-pay | Admitting: *Deleted

## 2019-03-07 DIAGNOSIS — Z952 Presence of prosthetic heart valve: Secondary | ICD-10-CM | POA: Diagnosis not present

## 2019-03-07 DIAGNOSIS — Z95828 Presence of other vascular implants and grafts: Secondary | ICD-10-CM | POA: Diagnosis not present

## 2019-03-07 DIAGNOSIS — D692 Other nonthrombocytopenic purpura: Secondary | ICD-10-CM | POA: Diagnosis not present

## 2019-03-07 DIAGNOSIS — I712 Thoracic aortic aneurysm, without rupture: Secondary | ICD-10-CM | POA: Diagnosis not present

## 2019-03-07 DIAGNOSIS — E559 Vitamin D deficiency, unspecified: Secondary | ICD-10-CM | POA: Diagnosis not present

## 2019-03-07 DIAGNOSIS — M8580 Other specified disorders of bone density and structure, unspecified site: Secondary | ICD-10-CM | POA: Diagnosis not present

## 2019-03-07 DIAGNOSIS — E7800 Pure hypercholesterolemia, unspecified: Secondary | ICD-10-CM | POA: Diagnosis not present

## 2019-03-07 DIAGNOSIS — I6529 Occlusion and stenosis of unspecified carotid artery: Secondary | ICD-10-CM | POA: Diagnosis not present

## 2019-03-07 DIAGNOSIS — I10 Essential (primary) hypertension: Secondary | ICD-10-CM | POA: Diagnosis not present

## 2019-03-07 NOTE — Progress Notes (Signed)
Spoke with pt for pre-op call. Pt has hx of Aortic Valve replacement, no CAD. Dr. Angelena Form is her cardiologist, clearance given in February, 2020. Procedure had to be postponed due to Covid 19. Pt is not diabetic.  Pt was tested yesterday for Covid 19 and is negative. Pt states she is in [place].    Coronavirus Screening  Have you experienced the following symptoms:  Cough NO Fever (>100.57F)  NO Runny nose NO Sore throat NO Difficulty breathing/shortness of breath  NO  Have you or a family member traveled in the last 14 days and where? NO    Patient reminded that hospital visitation restrictions are in effect and the importance of the restrictions.

## 2019-03-08 ENCOUNTER — Ambulatory Visit (HOSPITAL_COMMUNITY): Payer: PPO | Admitting: Certified Registered"

## 2019-03-08 ENCOUNTER — Encounter (HOSPITAL_COMMUNITY)
Admission: RE | Disposition: A | Discharge status: Home / Self Care | Payer: Self-pay | Source: Home / Self Care | Attending: Interventional Radiology

## 2019-03-08 ENCOUNTER — Observation Stay (HOSPITAL_COMMUNITY)
Admission: RE | Admit: 2019-03-08 | Discharge: 2019-03-09 | Disposition: A | Discharge status: Home / Self Care | Payer: PPO | Attending: Interventional Radiology | Admitting: Interventional Radiology

## 2019-03-08 ENCOUNTER — Encounter (HOSPITAL_COMMUNITY): Payer: Self-pay | Admitting: Interventional Radiology

## 2019-03-08 ENCOUNTER — Ambulatory Visit (HOSPITAL_COMMUNITY)
Admission: RE | Admit: 2019-03-08 | Discharge: 2019-03-08 | Disposition: A | Discharge status: Home / Self Care | Payer: PPO | Source: Ambulatory Visit | Attending: Interventional Radiology | Admitting: Interventional Radiology

## 2019-03-08 DIAGNOSIS — I712 Thoracic aortic aneurysm, without rupture, unspecified: Secondary | ICD-10-CM

## 2019-03-08 DIAGNOSIS — M1990 Unspecified osteoarthritis, unspecified site: Secondary | ICD-10-CM | POA: Insufficient documentation

## 2019-03-08 DIAGNOSIS — Z87891 Personal history of nicotine dependence: Secondary | ICD-10-CM | POA: Insufficient documentation

## 2019-03-08 DIAGNOSIS — Z7982 Long term (current) use of aspirin: Secondary | ICD-10-CM | POA: Insufficient documentation

## 2019-03-08 DIAGNOSIS — I10 Essential (primary) hypertension: Secondary | ICD-10-CM | POA: Diagnosis not present

## 2019-03-08 DIAGNOSIS — R5383 Other fatigue: Secondary | ICD-10-CM | POA: Diagnosis not present

## 2019-03-08 DIAGNOSIS — T82330A Leakage of aortic (bifurcation) graft (replacement), initial encounter: Secondary | ICD-10-CM | POA: Diagnosis present

## 2019-03-08 DIAGNOSIS — Y832 Surgical operation with anastomosis, bypass or graft as the cause of abnormal reaction of the patient, or of later complication, without mention of misadventure at the time of the procedure: Secondary | ICD-10-CM | POA: Diagnosis not present

## 2019-03-08 DIAGNOSIS — Z9851 Tubal ligation status: Secondary | ICD-10-CM | POA: Insufficient documentation

## 2019-03-08 DIAGNOSIS — Z95820 Peripheral vascular angioplasty status with implants and grafts: Secondary | ICD-10-CM | POA: Insufficient documentation

## 2019-03-08 DIAGNOSIS — Z885 Allergy status to narcotic agent status: Secondary | ICD-10-CM | POA: Insufficient documentation

## 2019-03-08 DIAGNOSIS — Z952 Presence of prosthetic heart valve: Secondary | ICD-10-CM | POA: Insufficient documentation

## 2019-03-08 DIAGNOSIS — Z91040 Latex allergy status: Secondary | ICD-10-CM | POA: Diagnosis not present

## 2019-03-08 DIAGNOSIS — Z79899 Other long term (current) drug therapy: Secondary | ICD-10-CM | POA: Diagnosis not present

## 2019-03-08 DIAGNOSIS — I517 Cardiomegaly: Secondary | ICD-10-CM | POA: Diagnosis not present

## 2019-03-08 DIAGNOSIS — IMO0002 Reserved for concepts with insufficient information to code with codable children: Secondary | ICD-10-CM

## 2019-03-08 HISTORY — PX: IR EMBO VENOUS NOT HEMORR HEMANG  INC GUIDE ROADMAPPING: IMG5447

## 2019-03-08 HISTORY — PX: RADIOLOGY WITH ANESTHESIA: SHX6223

## 2019-03-08 HISTORY — DX: Constipation, unspecified: K59.00

## 2019-03-08 HISTORY — PX: EMBOLIZATION: SHX1496

## 2019-03-08 HISTORY — DX: Anemia, unspecified: D64.9

## 2019-03-08 HISTORY — PX: IR CT SPINE LTD: IMG2387

## 2019-03-08 HISTORY — PX: IR AORTA/THORACIC: IMG634

## 2019-03-08 HISTORY — DX: Unspecified osteoarthritis, unspecified site: M19.90

## 2019-03-08 LAB — CBC
HCT: 36.7 % (ref 36.0–46.0)
Hemoglobin: 11.2 g/dL — ABNORMAL LOW (ref 12.0–15.0)
MCH: 26.5 pg (ref 26.0–34.0)
MCHC: 30.5 g/dL (ref 30.0–36.0)
MCV: 86.8 fL (ref 80.0–100.0)
Platelets: 172 10*3/uL (ref 150–400)
RBC: 4.23 MIL/uL (ref 3.87–5.11)
RDW: 14.9 % (ref 11.5–15.5)
WBC: 6.2 10*3/uL (ref 4.0–10.5)
nRBC: 0 % (ref 0.0–0.2)

## 2019-03-08 LAB — BASIC METABOLIC PANEL
Anion gap: 8 (ref 5–15)
BUN: 21 mg/dL (ref 8–23)
CO2: 24 mmol/L (ref 22–32)
Calcium: 9.3 mg/dL (ref 8.9–10.3)
Chloride: 110 mmol/L (ref 98–111)
Creatinine, Ser: 1.08 mg/dL — ABNORMAL HIGH (ref 0.44–1.00)
GFR calc Af Amer: 59 mL/min — ABNORMAL LOW (ref >60)
GFR calc non Af Amer: 51 mL/min — ABNORMAL LOW (ref >60)
Glucose, Bld: 93 mg/dL (ref 70–99)
Potassium: 3.8 mmol/L (ref 3.5–5.1)
Sodium: 142 mmol/L (ref 135–145)

## 2019-03-08 LAB — PROTIME-INR
INR: 1.1 (ref 0.8–1.2)
Prothrombin Time: 14 seconds (ref 11.4–15.2)

## 2019-03-08 SURGERY — RADIOLOGY WITH ANESTHESIA
Anesthesia: General

## 2019-03-08 MED ORDER — IRBESARTAN 300 MG PO TABS
300.0000 mg | ORAL_TABLET | Freq: Every day | ORAL | Status: DC
  Administered 2019-03-08: 300 mg via ORAL
  Filled 2019-03-08: qty 1

## 2019-03-08 MED ORDER — HYDRALAZINE HCL 20 MG/ML IJ SOLN
10.0000 mg | Freq: Once | INTRAMUSCULAR | Status: AC
  Administered 2019-03-08: 10 mg via INTRAVENOUS

## 2019-03-08 MED ORDER — LACTATED RINGERS IV SOLN
INTRAVENOUS | Status: DC | PRN
  Administered 2019-03-08: 08:00:00 via INTRAVENOUS

## 2019-03-08 MED ORDER — FENTANYL CITRATE (PF) 250 MCG/5ML IJ SOLN
INTRAMUSCULAR | Status: DC | PRN
  Administered 2019-03-08: 75 ug via INTRAVENOUS

## 2019-03-08 MED ORDER — SODIUM CHLORIDE 0.9 % IV SOLN: INTRAVENOUS | Status: DC

## 2019-03-08 MED ORDER — ONDANSETRON HCL 4 MG/2ML IJ SOLN: 4.0000 mg | Freq: Four times a day (QID) | INTRAMUSCULAR | Status: DC | PRN

## 2019-03-08 MED ORDER — IOHEXOL 300 MG/ML  SOLN
150.0000 mL | Freq: Once | INTRAMUSCULAR | Status: AC | PRN
  Administered 2019-03-08: 20 mL via INTRAVENOUS

## 2019-03-08 MED ORDER — SODIUM CHLORIDE 0.9 % IV SOLN
INTRAVENOUS | Status: DC | PRN
  Administered 2019-03-08: 25 ug/min via INTRAVENOUS

## 2019-03-08 MED ORDER — SODIUM CHLORIDE 0.9% IV SOLUTION: Freq: Once | INTRAVENOUS | Status: DC

## 2019-03-08 MED ORDER — CEFAZOLIN SODIUM-DEXTROSE 2-4 GM/100ML-% IV SOLN
2.0000 g | Freq: Once | INTRAVENOUS | Status: AC
  Administered 2019-03-08: 2 g via INTRAVENOUS

## 2019-03-08 MED ORDER — ROCURONIUM BROMIDE 10 MG/ML (PF) SYRINGE
PREFILLED_SYRINGE | INTRAVENOUS | Status: DC | PRN
  Administered 2019-03-08: 50 mg via INTRAVENOUS
  Administered 2019-03-08: 20 mg via INTRAVENOUS

## 2019-03-08 MED ORDER — HYDRALAZINE HCL 20 MG/ML IJ SOLN
5.0000 mg | Freq: Once | INTRAMUSCULAR | Status: DC
  Filled 2019-03-08: qty 0.25

## 2019-03-08 MED ORDER — VITAMIN D 25 MCG (1000 UNIT) PO TABS
1000.0000 [IU] | ORAL_TABLET | Freq: Every day | ORAL | Status: DC
  Administered 2019-03-08: 15:00:00 1000 [IU] via ORAL
  Filled 2019-03-08: qty 1

## 2019-03-08 MED ORDER — ASPIRIN EC 81 MG PO TBEC: 81.0000 mg | DELAYED_RELEASE_TABLET | Freq: Every day | ORAL | Status: DC

## 2019-03-08 MED ORDER — METOPROLOL TARTRATE 50 MG PO TABS
50.0000 mg | ORAL_TABLET | Freq: Two times a day (BID) | ORAL | Status: DC
  Administered 2019-03-08: 50 mg via ORAL
  Filled 2019-03-08: qty 1

## 2019-03-08 MED ORDER — LIDOCAINE 2% (20 MG/ML) 5 ML SYRINGE
INTRAMUSCULAR | Status: DC | PRN
  Administered 2019-03-08: 60 mg via INTRAVENOUS

## 2019-03-08 MED ORDER — PHENYLEPHRINE 40 MCG/ML (10ML) SYRINGE FOR IV PUSH (FOR BLOOD PRESSURE SUPPORT)
PREFILLED_SYRINGE | INTRAVENOUS | Status: DC | PRN
  Administered 2019-03-08: 40 ug via INTRAVENOUS

## 2019-03-08 MED ORDER — HYDROCODONE-ACETAMINOPHEN 5-325 MG PO TABS
1.0000 | ORAL_TABLET | ORAL | Status: DC | PRN
  Administered 2019-03-08: 1 via ORAL
  Filled 2019-03-08: qty 1

## 2019-03-08 MED ORDER — DEXAMETHASONE SODIUM PHOSPHATE 10 MG/ML IJ SOLN
INTRAMUSCULAR | Status: DC | PRN
  Administered 2019-03-08: 5 mg via INTRAVENOUS

## 2019-03-08 MED ORDER — DOCUSATE SODIUM 100 MG PO CAPS
100.0000 mg | ORAL_CAPSULE | Freq: Two times a day (BID) | ORAL | Status: DC
  Administered 2019-03-08: 22:00:00 100 mg via ORAL
  Filled 2019-03-08: qty 1

## 2019-03-08 MED ORDER — ONDANSETRON HCL 4 MG/2ML IJ SOLN
INTRAMUSCULAR | Status: DC | PRN
  Administered 2019-03-08: 4 mg via INTRAVENOUS

## 2019-03-08 MED ORDER — HYDRALAZINE HCL 20 MG/ML IJ SOLN
INTRAMUSCULAR | Status: AC
  Filled 2019-03-08: qty 1

## 2019-03-08 MED ORDER — HYDRALAZINE HCL 20 MG/ML IJ SOLN
INTRAMUSCULAR | Status: AC
  Administered 2019-03-08: 5 mg
  Filled 2019-03-08: qty 1

## 2019-03-08 MED ORDER — ACETAMINOPHEN 500 MG PO TABS: 1000.0000 mg | ORAL_TABLET | Freq: Once | ORAL | Status: DC

## 2019-03-08 MED ORDER — ATORVASTATIN CALCIUM 10 MG PO TABS: 20.0000 mg | ORAL_TABLET | Freq: Every day | ORAL | Status: DC

## 2019-03-08 MED ORDER — GELATIN ABSORBABLE 12-7 MM EX MISC
CUTANEOUS | Status: AC
  Filled 2019-03-08: qty 1

## 2019-03-08 MED ORDER — CEFAZOLIN SODIUM-DEXTROSE 2-4 GM/100ML-% IV SOLN
INTRAVENOUS | Status: AC
  Filled 2019-03-08: qty 100

## 2019-03-08 MED ORDER — SUGAMMADEX SODIUM 200 MG/2ML IV SOLN
INTRAVENOUS | Status: DC | PRN
  Administered 2019-03-08: 150 mg via INTRAVENOUS

## 2019-03-08 MED ORDER — PROPOFOL 10 MG/ML IV BOLUS
INTRAVENOUS | Status: DC | PRN
  Administered 2019-03-08: 20 mg via INTRAVENOUS
  Administered 2019-03-08: 100 mg via INTRAVENOUS

## 2019-03-08 MED ORDER — FENTANYL CITRATE (PF) 100 MCG/2ML IJ SOLN: 25.0000 ug | INTRAMUSCULAR | Status: DC | PRN

## 2019-03-08 MED ORDER — LACTATED RINGERS IV SOLN
INTRAVENOUS | Status: DC
  Administered 2019-03-08 (×2): via INTRAVENOUS

## 2019-03-08 MED ORDER — ESMOLOL HCL 100 MG/10ML IV SOLN
INTRAVENOUS | Status: DC | PRN
  Administered 2019-03-08: 30 mg via INTRAVENOUS

## 2019-03-08 NOTE — Transfer of Care (Signed)
Immediate Anesthesia Transfer of Care Note  Patient: Nancy Montoya  Procedure(s) Performed: RADIOLOGY WITH ANESTHESIA EMBOLIZATION (N/A )  Patient Location: PACU  Anesthesia Type:General  Level of Consciousness: awake, alert  and oriented  Airway & Oxygen Therapy: Patient Spontanous Breathing and Patient connected to face mask oxygen  Post-op Assessment: Report given to RN and Post -op Vital signs reviewed and stable  Post vital signs: Reviewed and stable  Last Vitals:  Vitals Value Taken Time  BP 185/76 03/08/2019 12:48 PM  Temp    Pulse 63 03/08/2019 12:56 PM  Resp 16 03/08/2019 12:56 PM  SpO2 100 % 03/08/2019 12:56 PM  Vitals shown include unvalidated device data.  Last Pain:  Vitals:   03/08/19 0639  TempSrc: Oral         Complications: No apparent anesthesia complications

## 2019-03-08 NOTE — Anesthesia Procedure Notes (Signed)
Arterial Line Insertion Start/End6/02/2019 8:20 AM, 03/08/2019 8:30 AM Performed by: Imagene Riches, CRNA, CRNA  Patient location: Pre-op. Preanesthetic checklist: patient identified, IV checked, site marked, risks and benefits discussed, surgical consent, monitors and equipment checked, pre-op evaluation, timeout performed and anesthesia consent Left, radial was placed Catheter size: 20 G Hand hygiene performed , maximum sterile barriers used  and Seldinger technique used  Attempts: 1 Procedure performed without using ultrasound guided technique. Following insertion, dressing applied and Biopatch. Post procedure assessment: normal  Patient tolerated the procedure well with no immediate complications.

## 2019-03-08 NOTE — Anesthesia Preprocedure Evaluation (Addendum)
Anesthesia Evaluation  Patient identified by MRN, date of birth, ID band Patient awake    Reviewed: Allergy & Precautions, NPO status , Patient's Chart, lab work & pertinent test results, reviewed documented beta blocker date and time   Airway Mallampati: I  TM Distance: >3 FB Neck ROM: Full    Dental no notable dental hx. (+) Edentulous Upper, Dental Advisory Given   Pulmonary neg pulmonary ROS, former smoker,    Pulmonary exam normal breath sounds clear to auscultation       Cardiovascular hypertension, Pt. on home beta blockers and Pt. on medications Normal cardiovascular exam Rhythm:Regular Rate:Normal  Thoracic aortic aneurysm s/p endovascular graft 2018  LHC 2018 1. No angiographic evidence of CAD 2. Normal filling pressures  TTE 2019 - Left ventricle: The cavity size was normal. There was moderate focal basal hypertrophy. Systolic function was normal. The estimated ejection fraction was in the range of 55% to 60%. Wall motion was normal; there were no regional wall motion abnormalities. - Aortic valve: A bioprosthesis was present and functioning normally. Mean gradient (S): 11 mm Hg. - Mitral valve: There was mild regurgitation. - Left atrium: The atrium was mildly dilated. - Right atrium: The atrium was mildly to moderately dilated. - Tricuspid valve: There was mild-moderate regurgitation. - Pulmonic valve: There was mild regurgitation. - Pulmonary arteries: PA peak pressure: 36 mm Hg (S). - Inferior vena cava: The vessel was mildly dilated - The right ventricular systolic pressure was increased consistent with mild pulmonary hypertension.   Neuro/Psych negative neurological ROS  negative psych ROS   GI/Hepatic negative GI ROS, Neg liver ROS,   Endo/Other  negative endocrine ROS  Renal/GU negative Renal ROS  negative genitourinary   Musculoskeletal  (+) Arthritis ,   Abdominal   Peds  Hematology negative  hematology ROS (+)   Anesthesia Other Findings Type 2 Endoleak Leak post EVAR of Thoracic Aortic Aneurysm   Reproductive/Obstetrics                           Anesthesia Physical Anesthesia Plan  ASA: III  Anesthesia Plan: General   Post-op Pain Management:    Induction: Intravenous  PONV Risk Score and Plan: 3 and Ondansetron, Dexamethasone and Treatment may vary due to age or medical condition  Airway Management Planned: Oral ETT  Additional Equipment: Arterial line  Intra-op Plan:   Post-operative Plan: Extubation in OR  Informed Consent: I have reviewed the patients History and Physical, chart, labs and discussed the procedure including the risks, benefits and alternatives for the proposed anesthesia with the patient or authorized representative who has indicated his/her understanding and acceptance.     Dental advisory given  Plan Discussed with: CRNA  Anesthesia Plan Comments:         Anesthesia Quick Evaluation

## 2019-03-08 NOTE — Anesthesia Procedure Notes (Signed)
Procedure Name: Intubation Date/Time: 03/08/2019 10:01 AM Performed by: Imagene Riches, CRNA Pre-anesthesia Checklist: Patient identified, Emergency Drugs available, Suction available and Patient being monitored Patient Re-evaluated:Patient Re-evaluated prior to induction Oxygen Delivery Method: Circle System Utilized Preoxygenation: Pre-oxygenation with 100% oxygen Induction Type: IV induction Ventilation: Mask ventilation without difficulty Laryngoscope Size: Miller and 2 Grade View: Grade I Tube type: Oral Tube size: 7.0 mm Number of attempts: 1 Airway Equipment and Method: Stylet and Oral airway Placement Confirmation: ETT inserted through vocal cords under direct vision,  positive ETCO2 and breath sounds checked- equal and bilateral Secured at: 20 cm Tube secured with: Tape Dental Injury: Teeth and Oropharynx as per pre-operative assessment

## 2019-03-08 NOTE — Progress Notes (Signed)
Patient's IV catheter is leaking at the multiple outlet connections but unable to removed the said leaking connection since it is OR installed.  IVF stopped, patient taking POs well and does not want a new IV site to be able to put a new IV catheter tubing since pt. Is going home tomorrow.  MD made aware.

## 2019-03-08 NOTE — H&P (Deleted)
  The note originally documented on this encounter has been moved the the encounter in which it belongs.  

## 2019-03-08 NOTE — Procedures (Signed)
Interventional Radiology Procedure Note  Procedure: Attempted endoleak repair  Complications: None immediate.   Estimated Blood Loss: None  Recommendations: - Admit for observation.  - Bedrest 6 hours - ADAT   Signed,  Criselda Peaches, MD

## 2019-03-08 NOTE — Sedation Documentation (Signed)
Report given to PACU RN.  Puncture site to Left thoracic posterior, dressing CDI, raised (possible hematoma).  Outlined the raised part.  Notified Dr. Laurence Ferrari.  Pt care transferred to PACU RN

## 2019-03-08 NOTE — H&P (Addendum)
Chief Complaint: Patient was seen in consultation today for Type II endoleak  Supervising Physician: Jacqulynn Cadet  Patient Status: [facility] - Out-pt  History of Present Illness: Nancy Montoya is a 73 y.o. female with past medical history of aortic valvular and thoracic aortic aneurysmal disease status post combined open surgical and endovascular aortic repair including aortic valve replacement, ascending aorta and arch replacement with reconstruction and reimplantation of the innominate artery as well as open carotid subclavian bypass followed by thoracic endovascular aortic repair with Dr. Cyndia Bent in 2018.  She now has a persistent type II endoleak of which she is asymptomatic.  Surveillance imaging has demonstrated growth of the aneurysmal sac of the past 6 months.   She was referred to Dr. Laurence Ferrari for evaluation and management who has discussed the merits of a direct sac puncture repair.  She is agreeable and presents to [facility] Radiology for procedure today.   She has been NPO.  She does not take blood thinners.  She denies fever, chills, nausea, vomiting, abdominal pain, back pain, dysuria.   Past Medical History:  Diagnosis Date   Anemia    Arthritis    Blood transfusion without reported diagnosis 2009   anemia   Constipation    Hypertension    Pneumonia    hx recent   Thoracic aortic aneurysm without rupture [facility])     Past Surgical History:  Procedure Laterality Date   BENTALL PROCEDURE N/A 10/20/2016   Procedure: BIOLOGIC BENTALL PROCEDURE -21 mm Medtronic Freestyle Porcine Valve Conduit;  Surgeon: Gaye Pollack, MD;  Location: [place];  Service: Open Heart Surgery;  Laterality: N/A;  RIGHT AXILLARY ARTERY CANNULATION  BILATERAL RADIAL ARTERIAL LINES   CARDIAC CATHETERIZATION N/A 10/11/2016   Procedure: Right/Left Heart Cath and Coronary Angiography;  Surgeon: Burnell Blanks, MD;  Location: [place] [facility];  Service: Cardiovascular;  Laterality: N/A;     CAROTID-SUBCLAVIAN BYPASS GRAFT Left 08/17/2017   Procedure: BYPASS HEMASHIELD GOLD GRAFT 41m x 30cm CAROTID-SUBCLAVIAN;  Surgeon: BSerafina Mitchell MD;  Location: [facility] OR;  Service: Vascular;  Laterality: Left;   COLONOSCOPY     IR RADIOLOGIST EVAL & MGMT  11/08/2018   REPLACEMENT ASCENDING AORTA N/A 10/20/2016   Procedure: REPLACEMENT ASCENDING AORTA WITH ARCH RECONSTRUCTION-Aorto-Inominate bypass, Aorto-Carotid Bypass.. With Hypothermic circulatory arrest;  Surgeon: BGaye Pollack MD;  [place]  Service: Open Heart Surgery;  Laterality: N/A;   TEE WITHOUT CARDIOVERSION N/A 10/20/2016   Procedure: TRANSESOPHAGEAL ECHOCARDIOGRAM (TEE);  Surgeon: BGaye Pollack MD;  [place]  Service: Open Heart Surgery;  Laterality: N/A;   THORACIC AORTIC ENDOVASCULAR STENT GRAFT N/A 08/17/2017   Procedure: THORACIC AORTIC ENDOVASCULAR STENT GRAFT;  Surgeon: BSerafina Mitchell MD;  Location: [facility] OR;  Service: Vascular;  Laterality: N/A;   TUBAL LIGATION  1976    Allergies: Codeine and Latex  Medications: Prior to Admission medications   Medication Sig Start Date End Date Taking? Authorizing Provider  aspirin EC 81 MG tablet Take 81 mg by mouth daily.    [provider]  atorvastatin (LIPITOR) 20 MG tablet Take 20 mg by mouth daily.    [provider]  cholecalciferol (VITAMIN D3) 25 MCG (1000 UT) tablet Take 1,000 Units by mouth daily.    [provider]  metoprolol tartrate (LOPRESSOR) 50 MG tablet TAKE 1 TABLET BY MOUTH TWICE A DAY 02/04/19   MBurnell Blanks MD  olmesartan (BENICAR) 40 MG tablet TAKE 1 TABLET BY MOUTH EVERY  DAY 11/05/18   Burnell Blanks, MD     Family History  Problem Relation Age of Onset   Colon cancer Neg Hx     Social History   Socioeconomic History   Marital status: Married    Spouse name: Not on file   Number of children: 1   Years of education: Not on file   Highest education level: Not on file   Occupational History   Occupation: Semi Retired-housework  Scientist, product/process development strain: Not on file   Food insecurity:    Worry: Not on file    Inability: Not on file   Transportation needs:    Medical: Not on file    Non-medical: Not on file  Tobacco Use   Smoking status: Former Smoker    Packs/day: 0.50    Years: 40.00    Pack years: 20.00    Types: Cigarettes    Start date: 10/06/1968    Last attempt to quit: 06/22/2008    Years since quitting: 10.7   Smokeless tobacco: Never Used  Substance and Sexual Activity   Alcohol use: No   Drug use: No   Sexual activity: Not on file  Lifestyle   Physical activity:    Days per week: Not on file    Minutes per session: Not on file   Stress: Not on file  Relationships   Social connections:    Talks on phone: Not on file    Gets together: Not on file    Attends religious service: Not on file    Active member of club or organization: Not on file    Attends meetings of clubs or organizations: Not on file    Relationship status: Not on file  Other Topics Concern   Not on file  Social History Narrative   Not on file     Review of Systems: A 12 point ROS discussed and pertinent positives are indicated in the HPI above.  All other systems are negative.  Review of Systems  Constitutional: Negative for fatigue and fever.  Respiratory: Negative for cough and shortness of breath.   Cardiovascular: Negative for chest pain.  Gastrointestinal: Negative for abdominal pain, nausea and vomiting.  Genitourinary: Negative for dysuria.  Musculoskeletal: Negative for back pain.  Psychiatric/Behavioral: Negative for behavioral problems and confusion.    Vital Signs: There were no vitals taken for this visit.  Physical Exam Vitals signs and nursing note reviewed.  Constitutional:      Appearance: Normal appearance. She is normal weight.  Cardiovascular:     Rate and Rhythm: Normal rate and regular rhythm.      Heart sounds: No murmur. No friction rub. No gallop.   Pulmonary:     Effort: Pulmonary effort is normal.     Breath sounds: Normal breath sounds.  Skin:    General: Skin is warm and dry.  Neurological:     General: No focal deficit present.     Mental Status: She is alert and oriented to person, place, and time. Mental status is at baseline.  Psychiatric:        Mood and Affect: Mood normal.        Behavior: Behavior normal.        Thought Content: Thought content normal.        Judgment: Judgment normal.      MD Evaluation Airway: WNL Heart: WNL Abdomen: WNL Chest/ Lungs: WNL ASA  Classification: 3 Mallampati/Airway Score: One   Imaging:  No results found.  Labs:  CBC: Recent Labs    03/08/19 0742  WBC 6.2  HGB 11.2*  HCT 36.7  PLT 172    COAGS: Recent Labs    03/08/19 0742  INR 1.1    BMP: Recent Labs    03/08/19 0742  NA 142  K 3.8  CL 110  CO2 24  GLUCOSE 93  BUN 21  CALCIUM 9.3  CREATININE 1.08*  GFRNONAA 51*  GFRAA 59*    LIVER FUNCTION TESTS: No results for input(s): BILITOT, AST, ALT, ALKPHOS, PROT, ALBUMIN in the last 8760 hours.  TUMOR MARKERS: No results for input(s): AFPTM, CEA, CA199, CHROMGRNA in the last 8760 hours.  Assessment and Plan: Patient with past medical history of aortic valvular and thoracic aortic aneurysmal disease presents with complaint of persistent, enlarging type II endoleak.  Patient has met with Dr. Laurence Ferrari in consulation to discuss management strategies and intervention.  He approves patient for embolization procedure.  Patient presents today in their usual state of health.  She has been NPO and is not currently on blood thinners.  Her blood pressure is significantly elevated today with SBP 210-230.  Anesthesiology assisting with management.  She did take her metoprolol this AM.  Given hydralazine in pre-op. Will discuss with IR MD.  Risks and benefits of direct sac puncture repair of type II  endoleak were discussed with the patient including, but not limited to bleeding, infection, vascular injury or contrast induced renal failure.  This interventional procedure involves the use of X-rays and because of the nature of the planned procedure, it is possible that we will have prolonged use of X-ray fluoroscopy.  Potential radiation risks to you include (but are not limited to) the following: - A slightly elevated risk for cancer  several years later in life. This risk is typically less than 0.5% percent. This risk is low in comparison to the normal incidence of human cancer, which is 33% for women and 50% for men according to the [place]. - Radiation induced injury can include skin redness, resembling a rash, tissue breakdown / ulcers and hair loss (which can be temporary or permanent).   The likelihood of either of these occurring depends on the difficulty of the procedure and whether you are sensitive to radiation due to previous procedures, disease, or genetic conditions.   IF your procedure requires a prolonged use of radiation, you will be notified and given written instructions for further action.  It is your responsibility to monitor the irradiated area for the 2 weeks following the procedure and to notify your physician if you are concerned that you have suffered a radiation induced injury.    All of the patient's questions were answered, patient is agreeable to proceed.  Consent signed and in chart.  Thank you for this interesting consult.  I greatly enjoyed meeting Beena Naylea Wigington and look forward to participating in their care.  A copy of this report was sent to the requesting provider on this date.  Electronically Signed: Docia Barrier, PA 03/08/2019, 8:53 AM   I spent a total of    15 Minutes in face to face in clinical consultation, greater than 50% of which was counseling/coordinating care for type II endoleak.

## 2019-03-08 NOTE — Sedation Documentation (Addendum)
Pt under the care of anesthesia  

## 2019-03-08 NOTE — Anesthesia Postprocedure Evaluation (Signed)
Anesthesia Post Note  Patient: Nancy Montoya  Procedure(s) Performed: RADIOLOGY WITH ANESTHESIA EMBOLIZATION (N/A )     Patient location during evaluation: PACU Anesthesia Type: General Level of consciousness: awake and alert Pain management: pain level controlled Vital Signs Assessment: post-procedure vital signs reviewed and stable Respiratory status: spontaneous breathing, nonlabored ventilation, respiratory function stable and patient connected to nasal cannula oxygen Cardiovascular status: blood pressure returned to baseline and stable Postop Assessment: no apparent nausea or vomiting Anesthetic complications: no    Last Vitals:  Vitals:   03/08/19 1348 03/08/19 1400  BP: 130/64   Pulse: 80 80  Resp: 18 (!) 21  Temp: (!) 36.1 C   SpO2: 96% 96%    Last Pain:  Vitals:   03/08/19 1348  TempSrc:   PainSc: 0-No pain                 Rykar Lebleu COKER

## 2019-03-09 DIAGNOSIS — T82330A Leakage of aortic (bifurcation) graft (replacement), initial encounter: Secondary | ICD-10-CM | POA: Diagnosis not present

## 2019-03-09 NOTE — Discharge Summary (Signed)
Patient ID: Nancy Montoya MRN: [id number] DOB/AGE: [birth year] 73 y.o.  Admit date: 03/08/2019 Discharge date: 03/09/2019  Supervising Physician: Jacqulynn Cadet  Patient Status: [facility] - In-pt  Admission Diagnoses: Type 2 endoleak; thoracic aortic endograft  Discharge Diagnoses:  Active Problems:   Endoleak of aortic graft [facility])   Discharged Condition: stable  Hospital Course: 73 year old female with a history of thoracic aortic aneurysm status post endovascular repair with a thoracic aortic endograft. On surveillance imaging, it was documented that the patient has a type 2 endoleak  Direct sac puncture of excluded thoracic aneurysm sac arteriogram--- No arterialized channel identified No endoleak embolization was performed. Pt was kept overnight for observation secondary direct sac puncture. Slept well; has eaten little but denies N/V. Urine is clean and yellow Ambulating in halls Dressed and ready to go home  I have informed Dr Laurence Ferrari of pt status Plan for DC home asap.   Consults: None  Significant Diagnostic Studies:  Successful direct sac puncture of excluded thoracic aneurysm sac.  Treatments: There is no arterialized vascular channel that could be identified. I suspect that the patient's prior endoleak may have spontaneously resolved. Embolization was therefore aborted.  Discharge Exam: Blood pressure (!) 157/71, pulse 66, temperature 98 F (36.7 C), temperature source Oral, resp. rate 18, height 5\' 6"  (1.676 m), weight 139 lb 15.9 oz (63.5 kg), SpO2 96 %.  A/O Appropriate [place]: RRR Lungs: CTA Abd: Soft; +BS; NT Extr: FROM ambulating in halls- no assistance Left back site: clean and dry; NT No bleeding; no hematoma New dressing placed. UOP: clear yellow  Results for orders placed or performed during the hospital encounter of 49/17/91  Basic metabolic panel  Result Value Ref Range   Sodium 142 135 - 145 mmol/L   Potassium 3.8 3.5 -  5.1 mmol/L   Chloride 110 98 - 111 mmol/L   CO2 24 22 - 32 mmol/L   Glucose, Bld 93 70 - 99 mg/dL   BUN 21 8 - 23 mg/dL   Creatinine, Ser 1.08 (H) 0.44 - 1.00 mg/dL   Calcium 9.3 8.9 - 10.3 mg/dL   GFR calc non Af Amer 51 (L) >60 mL/min   GFR calc Af Amer 59 (L) >60 mL/min   Anion gap 8 5 - 15  CBC  Result Value Ref Range   WBC 6.2 4.0 - 10.5 K/uL   RBC 4.23 3.87 - 5.11 MIL/uL   Hemoglobin 11.2 (L) 12.0 - 15.0 g/dL   HCT 36.7 36.0 - 46.0 %   MCV 86.8 80.0 - 100.0 fL   MCH 26.5 26.0 - 34.0 pg   MCHC 30.5 30.0 - 36.0 g/dL   RDW 14.9 11.5 - 15.5 %   Platelets 172 150 - 400 K/uL   nRBC 0.0 0.0 - 0.2 %  Protime-INR  Result Value Ref Range   Prothrombin Time 14.0 11.4 - 15.2 seconds   INR 1.1 0.8 - 1.2  Type and screen [place]  Result Value Ref Range   ABO/RH(D) O POS    Antibody Screen NEG    Sample Expiration 03/11/2019,2359    Unit Number [id number]    Blood Component Type RED CELLS,LR    Unit division 00    Status of Unit ALLOCATED    Transfusion Status OK TO TRANSFUSE    Crossmatch Result      Compatible Performed at [facility], [address] [place], [place] [address]    Unit Number [id number]  Blood Component Type RED CELLS,LR    Unit division 00    Status of Unit ALLOCATED    Transfusion Status OK TO TRANSFUSE    Crossmatch Result Compatible   BPAM RBC  Result Value Ref Range   Blood Product Unit Number [id number]    PRODUCT CODE E0336V00    Unit Type and Rh 5100    Blood Product Expiration Date 662947654650    Blood Product Unit Number [id number]    PRODUCT CODE E0382V00    Unit Type and Rh 5100    Blood Product Expiration Date 001749449675      Disposition: Type 2 Endoleak; Thoracic aortic endograft No leak identified; possible resolution of same No endoleak embolization was performed Pt has had an uneventful overnight stay Plan to DC to home Continue home meds No additional meds needed Plan for 6 mo  follow up in IR OP Clinic with Dr Laurence Ferrari Pt has good understanding of plan   Discharge Instructions    Call MD for:   Complete by:  As directed    Call MD for:  difficulty breathing, headache or visual disturbances   Complete by:  As directed    Call MD for:  extreme fatigue   Complete by:  As directed    Call MD for:  hives   Complete by:  As directed    Call MD for:  persistant dizziness or light-headedness   Complete by:  As directed    Call MD for:  persistant nausea and vomiting   Complete by:  As directed    Call MD for:  redness, tenderness, or signs of infection (pain, swelling, redness, odor or green/yellow discharge around incision site)   Complete by:  As directed    Call MD for:  severe uncontrolled pain   Complete by:  As directed    Call MD for:  temperature >100.4   Complete by:  As directed    Diet - low sodium heart healthy   Complete by:  As directed    Discharge instructions   Complete by:  As directed    Restful today-- increase activity slowly   Discharge wound care:   Complete by:  As directed    May shower tomorrow; keep band aid on site daily for 5-7 days   Increase activity slowly   Complete by:  As directed    Lifting restrictions   Complete by:  As directed    No lifting over 10 lbs x 5 days     Allergies as of 03/09/2019      Reactions   Codeine Other (See Comments)   hallucinations   Latex Rash      Medication List    TAKE these medications   aspirin EC 81 MG tablet Take 81 mg by mouth daily. Notes to patient:  Per home use   atorvastatin 20 MG tablet Commonly known as:  LIPITOR Take 20 mg by mouth daily. Notes to patient:  Per home use   cholecalciferol 25 MCG (1000 UT) tablet Commonly known as:  VITAMIN D3 Take 1,000 Units by mouth daily. Notes to patient:  Per home use   metoprolol tartrate 50 MG tablet Commonly known as:  LOPRESSOR TAKE 1 TABLET BY MOUTH TWICE A DAY Notes to patient:  Per home use   olmesartan 40 MG  tablet Commonly known as:  BENICAR TAKE 1 TABLET BY MOUTH EVERY DAY Notes to patient:  Per home use  Discharge Care Instructions  (From admission, onward)         Start     Ordered   03/09/19 0000  Discharge wound care:    Comments:  May shower tomorrow; keep band aid on site daily for 5-7 days   03/09/19 1228         Follow-up Information    Jacqulynn Cadet, MD Follow up in 6 month(s).   Specialties:  Interventional Radiology, Radiology Why:  clinic will call pt with time and date for 6 mo follow up; call [phone] if questions or concerns Contact information: [address] [place] [address] [phone]            Electronically Signed: Lavonia Drafts, PA-C 03/09/2019, 12:29 PM   I have spent Greater Than 30 Minutes discharging [place].

## 2019-03-09 NOTE — Progress Notes (Signed)
Dannetta Jaci Carrel to be D/C'd  per MD order. Discussed with the patient and all questions fully answered.  VSS, Skin clean, dry and intact without evidence of skin break down, no evidence of skin tears noted.  IV catheter discontinued intact. Site without signs and symptoms of complications. Dressing and pressure applied.  An After Visit Summary was printed and given to the patient.  D/c education completed with patient/family including follow up instructions, medication list, d/c activities limitations if indicated, with other d/c instructions as indicated by MD - patient able to verbalize understanding, all questions fully answered.   Patient instructed to return to ED, call 911, or call MD for any changes in condition.   Patient to be escorted via [place], and D/C home via private auto.

## 2019-03-10 LAB — TYPE AND SCREEN
ABO/RH(D): O POS
Antibody Screen: NEGATIVE
Unit division: 0
Unit division: 0

## 2019-03-10 LAB — BPAM RBC
Blood Product Expiration Date: 202006242359
Blood Product Expiration Date: 202007072359
Unit Type and Rh: 5100
Unit Type and Rh: 5100

## 2019-03-11 ENCOUNTER — Other Ambulatory Visit (HOSPITAL_COMMUNITY): Payer: Self-pay | Admitting: Interventional Radiology

## 2019-03-11 ENCOUNTER — Encounter (HOSPITAL_COMMUNITY): Payer: Self-pay

## 2019-03-11 DIAGNOSIS — I712 Thoracic aortic aneurysm, without rupture, unspecified: Secondary | ICD-10-CM

## 2019-03-12 ENCOUNTER — Encounter (HOSPITAL_COMMUNITY): Payer: Self-pay | Admitting: Interventional Radiology

## 2019-03-18 ENCOUNTER — Other Ambulatory Visit: Payer: Self-pay | Admitting: Surgery

## 2019-03-18 DIAGNOSIS — I712 Thoracic aortic aneurysm, without rupture, unspecified: Secondary | ICD-10-CM

## 2019-03-20 NOTE — Addendum Note (Signed)
Addendum  created 03/20/19 1953 by Freddrick March, MD   Intraprocedure Staff edited

## 2019-04-07 DIAGNOSIS — I700 Atherosclerosis of aorta: Secondary | ICD-10-CM | POA: Insufficient documentation

## 2019-04-08 ENCOUNTER — Ambulatory Visit
Admission: RE | Admit: 2019-04-08 | Discharge: 2019-04-08 | Disposition: A | Discharge status: Home / Self Care | Payer: PPO | Source: Ambulatory Visit | Attending: Surgery | Admitting: Surgery

## 2019-04-08 DIAGNOSIS — Z95828 Presence of other vascular implants and grafts: Secondary | ICD-10-CM | POA: Diagnosis not present

## 2019-04-08 DIAGNOSIS — I712 Thoracic aortic aneurysm, without rupture, unspecified: Secondary | ICD-10-CM

## 2019-04-08 MED ORDER — IOPAMIDOL (ISOVUE-370) INJECTION 76%
75.0000 mL | Freq: Once | INTRAVENOUS | Status: AC | PRN
  Administered 2019-04-08: 75 mL via INTRAVENOUS

## 2019-04-15 ENCOUNTER — Other Ambulatory Visit: Payer: Self-pay

## 2019-04-15 ENCOUNTER — Encounter: Payer: Self-pay | Admitting: Surgery

## 2019-04-15 ENCOUNTER — Ambulatory Visit (INDEPENDENT_AMBULATORY_CARE_PROVIDER_SITE_OTHER): Payer: PPO | Admitting: Surgery

## 2019-04-15 VITALS — BP 194/87 | HR 64 | Temp 97.6°F | Resp 18 | Ht 66.0 in | Wt 140.0 lb

## 2019-04-15 DIAGNOSIS — I712 Thoracic aortic aneurysm, without rupture, unspecified: Secondary | ICD-10-CM

## 2019-04-15 NOTE — Progress Notes (Signed)
[facility] [place]  Patient name: Nancy Montoya MRN: [id number] DOB: 1945/12/25 Sex: female   REASON FOR VISIT:    Follow up  [place]:   Nancy Montoya a 73y.o.femalewho returns today for follow up.. She is status post left carotid to left subclavian bypass graft with a 7 mm dacryon graft as well as endovascular repair of a 5.1 descending thoracic aortic aneurysm on 09-18-2017. Her postoperative course was uncomplicated.  She has a known type II endoleak.  Her last visit in January 2020 showed an increase in her aortic diameter to 5.9 cm.  She was referred to interventional radiology.  Attempted endoleak repair was performed on 03/09/2019 via a direct sac puncture.  No arterialized channel was identified and therefore no embolization was performed.  It was felt that the endoleak had spontaneously resolved.  The patient has a history of hypertension which has been difficult to control.  She is a former smoker.  She is on a statin for hypercholesterolemia.  She has a history of vertigo but has not had an episode in a while.  This did flareup after her operation.   PAST MEDICAL HISTORY:   Past Medical History:  Diagnosis Date  . Anemia   . Arthritis   . Blood transfusion without reported diagnosis 2009   anemia  . Constipation   . Hypertension   . Pneumonia    hx recent  . Thoracic aortic aneurysm without rupture ([place])      FAMILY HISTORY:   Family History  Problem Relation Age of Onset  . Colon cancer Neg Hx     SOCIAL HISTORY:   Social History   Tobacco Use  . Smoking status: Former Smoker    Packs/day: 0.50    Years: 40.00    Pack years: 20.00    Types: Cigarettes    Start date: 10/06/1968    Quit date: 06/22/2008    Years since quitting: 10.8  . Smokeless tobacco: Never Used  Substance Use Topics  . Alcohol use: No     ALLERGIES:   Allergies  Allergen Reactions  .  Codeine Other (See Comments)    hallucinations  . Latex Rash     CURRENT MEDICATIONS:   Current Outpatient Medications  Medication Sig Dispense Refill  . aspirin EC 81 MG tablet Take 81 mg by mouth daily.    Marland Kitchen atorvastatin (LIPITOR) 20 MG tablet Take 20 mg by mouth daily.    . cholecalciferol (VITAMIN D3) 25 MCG (1000 UT) tablet Take 1,000 Units by mouth daily.    . metoprolol tartrate (LOPRESSOR) 50 MG tablet TAKE 1 TABLET BY MOUTH TWICE A DAY 180 tablet 0  . olmesartan (BENICAR) 40 MG tablet TAKE 1 TABLET BY MOUTH EVERY DAY 90 tablet 3   No current facility-administered medications for this visit.     REVIEW OF SYSTEMS:   [X]  denotes positive finding, [ ]  denotes negative finding Cardiac  Comments:  Chest pain or chest pressure:    Shortness of breath upon exertion:    Short of breath when lying flat:    Irregular heart rhythm:        Vascular    Pain in calf, thigh, or hip brought on by ambulation:    Pain in feet at night that wakes you up from your sleep:     Blood clot in your veins:    Leg swelling:         Pulmonary  Oxygen at home:    Productive cough:     Wheezing:         Neurologic    Sudden weakness in arms or legs:     Sudden numbness in arms or legs:     Sudden onset of difficulty speaking or slurred speech:    Temporary loss of vision in one eye:     Problems with dizziness:         Gastrointestinal    Blood in stool:     Vomited blood:         Genitourinary    Burning when urinating:     Blood in urine:        Psychiatric    Major depression:         Hematologic    Bleeding problems:    Problems with blood clotting too easily:        Skin    Rashes or ulcers:        Constitutional    Fever or chills:      PHYSICAL EXAM:   Vitals:   04/15/19 1159  BP: (!) 194/87  Pulse: 64  Resp: 18  Temp: 97.6 F (36.4 C)  TempSrc: Temporal  SpO2: 98%  Weight: 140 lb (63.5 kg)  Height: 5\' 6"  (1.676 m)    GENERAL: The patient is a  well-nourished female, in no acute distress. The vital signs are documented above. CARDIAC: There is a regular rate and rhythm.  PULMONARY: Non-labored respirations MUSCULOSKELETAL: There are no major deformities or cyanosis. NEUROLOGIC: No focal weakness or paresthesias are detected. SKIN: There are no ulcers or rashes noted. PSYCHIATRIC: The patient has a normal affect.  STUDIES:   I have reviewed the following studies: 1. Less conspicuous, though persistent opacification of the proximal native descending thoracic aorta, most suggestive of a residual type 2 endoleak though the source of the suspected endoleak is not definitely identified. Unfortunately, there appears to be continued growth in the size of the native proximal descending thoracic aorta, currently measuring 60 mm in greatest diameter, previously, 57 - 58 mm (when compared to the 09/2018 and 03/2018 examinations). No definitive intramural hematoma, contrast extravasation or perivascular stranding. Aortic aneurysm NOS (ICD10-I71.9). 2. Unchanged incomplete apposition of the distal end of the descending thoracic aorta stent graft at the level of the diaphragmatic hiatus without change in size of the distal descending thoracic aorta. 3. Similar findings of open ascending thoracic aortic repair and debranching procedure without evidence of complication. 4. Cardiomegaly. 5. Coronary calcifications.  Aortic Atherosclerosis (ICD10-I70.0).  MEDICAL ISSUES:   I spoke with Dr. Laurence Ferrari today regarding the findings on CT scan.  The endoleak is dramatically less pronounced on the arterial imaging, suggesting that this may be resolving.  We discussed reevaluating this in 6 months with a CT scan.  If there is continued expansion, we would consider coming back with angiography to see if we could identify a leak source, and if unsuccessful, would consider repeating the direct sac access and treating the area with Onyx or coils,  blindly.    [name], MD, FACS [facility] [phone] Pager [phone]

## 2019-06-15 DIAGNOSIS — Z23 Encounter for immunization: Secondary | ICD-10-CM | POA: Diagnosis not present

## 2019-06-19 ENCOUNTER — Other Ambulatory Visit: Payer: Self-pay | Admitting: Internal Medicine

## 2019-06-19 DIAGNOSIS — Z1231 Encounter for screening mammogram for malignant neoplasm of breast: Secondary | ICD-10-CM

## 2019-07-07 ENCOUNTER — Other Ambulatory Visit: Payer: Self-pay | Admitting: Cardiovascular Disease

## 2019-07-17 ENCOUNTER — Other Ambulatory Visit: Payer: Self-pay | Admitting: Cardiovascular Disease

## 2019-08-05 ENCOUNTER — Ambulatory Visit
Admission: RE | Admit: 2019-08-05 | Discharge: 2019-08-05 | Disposition: A | Discharge status: Home / Self Care | Payer: PPO | Source: Ambulatory Visit | Attending: Internal Medicine | Admitting: Internal Medicine

## 2019-08-05 ENCOUNTER — Other Ambulatory Visit: Payer: Self-pay

## 2019-08-05 DIAGNOSIS — Z1231 Encounter for screening mammogram for malignant neoplasm of breast: Secondary | ICD-10-CM

## 2019-08-08 ENCOUNTER — Other Ambulatory Visit: Payer: Self-pay | Admitting: Cardiovascular Disease

## 2019-08-08 NOTE — Telephone Encounter (Signed)
Pt overdue for 12 month f/u. °Please contact pt for future appointment. °

## 2019-08-15 DIAGNOSIS — E559 Vitamin D deficiency, unspecified: Secondary | ICD-10-CM | POA: Diagnosis not present

## 2019-08-15 DIAGNOSIS — I10 Essential (primary) hypertension: Secondary | ICD-10-CM | POA: Diagnosis not present

## 2019-08-16 DIAGNOSIS — R82998 Other abnormal findings in urine: Secondary | ICD-10-CM | POA: Diagnosis not present

## 2019-08-20 DIAGNOSIS — D692 Other nonthrombocytopenic purpura: Secondary | ICD-10-CM | POA: Diagnosis not present

## 2019-08-20 DIAGNOSIS — E441 Mild protein-calorie malnutrition: Secondary | ICD-10-CM | POA: Insufficient documentation

## 2019-08-20 DIAGNOSIS — E559 Vitamin D deficiency, unspecified: Secondary | ICD-10-CM | POA: Diagnosis not present

## 2019-08-20 DIAGNOSIS — I1310 Hypertensive heart and chronic kidney disease without heart failure, with stage 1 through stage 4 chronic kidney disease, or unspecified chronic kidney disease: Secondary | ICD-10-CM | POA: Diagnosis not present

## 2019-08-20 DIAGNOSIS — N182 Chronic kidney disease, stage 2 (mild): Secondary | ICD-10-CM | POA: Insufficient documentation

## 2019-08-20 DIAGNOSIS — Z95828 Presence of other vascular implants and grafts: Secondary | ICD-10-CM | POA: Diagnosis not present

## 2019-08-20 DIAGNOSIS — Z952 Presence of prosthetic heart valve: Secondary | ICD-10-CM | POA: Diagnosis not present

## 2019-08-20 DIAGNOSIS — K5900 Constipation, unspecified: Secondary | ICD-10-CM | POA: Insufficient documentation

## 2019-08-20 DIAGNOSIS — I6529 Occlusion and stenosis of unspecified carotid artery: Secondary | ICD-10-CM | POA: Diagnosis not present

## 2019-08-20 DIAGNOSIS — I712 Thoracic aortic aneurysm, without rupture: Secondary | ICD-10-CM | POA: Diagnosis not present

## 2019-08-20 DIAGNOSIS — R809 Proteinuria, unspecified: Secondary | ICD-10-CM | POA: Insufficient documentation

## 2019-08-20 DIAGNOSIS — Z1339 Encounter for screening examination for other mental health and behavioral disorders: Secondary | ICD-10-CM | POA: Diagnosis not present

## 2019-08-20 DIAGNOSIS — E7800 Pure hypercholesterolemia, unspecified: Secondary | ICD-10-CM | POA: Diagnosis not present

## 2019-08-20 DIAGNOSIS — Z0000 Encounter for general adult medical examination without abnormal findings: Secondary | ICD-10-CM | POA: Diagnosis not present

## 2019-09-18 DIAGNOSIS — Z1212 Encounter for screening for malignant neoplasm of rectum: Secondary | ICD-10-CM | POA: Diagnosis not present

## 2019-10-01 ENCOUNTER — Other Ambulatory Visit: Payer: Self-pay | Admitting: Surgery

## 2019-10-01 DIAGNOSIS — I712 Thoracic aortic aneurysm, without rupture, unspecified: Secondary | ICD-10-CM

## 2019-10-16 ENCOUNTER — Ambulatory Visit
Admission: RE | Admit: 2019-10-16 | Discharge: 2019-10-16 | Disposition: A | Discharge status: Home / Self Care | Payer: PPO | Source: Ambulatory Visit | Attending: Surgery | Admitting: Surgery

## 2019-10-16 ENCOUNTER — Other Ambulatory Visit: Payer: Self-pay

## 2019-10-16 DIAGNOSIS — I712 Thoracic aortic aneurysm, without rupture, unspecified: Secondary | ICD-10-CM

## 2019-10-16 MED ORDER — IOPAMIDOL (ISOVUE-370) INJECTION 76%
75.0000 mL | Freq: Once | INTRAVENOUS | Status: AC | PRN
  Administered 2019-10-16: 14:00:00 75 mL via INTRAVENOUS

## 2019-10-17 ENCOUNTER — Other Ambulatory Visit: Payer: Self-pay | Admitting: Interventional Radiology

## 2019-10-17 DIAGNOSIS — IMO0002 Reserved for concepts with insufficient information to code with codable children: Secondary | ICD-10-CM

## 2019-10-17 DIAGNOSIS — T82330A Leakage of aortic (bifurcation) graft (replacement), initial encounter: Secondary | ICD-10-CM

## 2019-10-17 NOTE — Progress Notes (Signed)
Chief Complaint  Patient presents with  . Follow-up    aortic valve disease    History of Present Illness: 74 yo female with history of HTN and thoracic aortic aneurysm who is here today for cardiac follow up. I met her in January 2018 to discuss a cardiac cath which was required as part of her pre-operative workup before planned replacement of her aorta. Cardiac cath 10/11/16 with no evidence of CAD. She underwent replacement of the aortic arch with aorto-innominate and aorto-left carotid bypass with Bentall procedure using Medtronic 21 mm Freestyle porcine root on 10/20/16. She then underwent left carotid to left subclavian bypass with endovascular repair of the descending thoracic aortic aneurysm in November 2018. She has done well since then. Echo October 2019 with LVEF=55-60%. The bioprosthetic AVR was working well. Mild MR. Endoleak repair of thoracic aortic graft June 2020 per interventional radiology.   She is here today for follow up. The patient denies any chest pain, dyspnea, palpitations, lower extremity edema, orthopnea, PND, dizziness, near syncope or syncope. BP has been in the 163A systolic at home.   Primary Care Physician: Haywood Pao, MD  Past Medical History:  Diagnosis Date  . Anemia   . Arthritis   . Blood transfusion without reported diagnosis 2009   anemia  . Constipation   . Hypertension   . Pneumonia    hx recent  . Thoracic aortic aneurysm without rupture [facility])     Past Surgical History:  Procedure Laterality Date  . BENTALL PROCEDURE N/A 10/20/2016   Procedure: BIOLOGIC BENTALL PROCEDURE -21 mm Medtronic Freestyle Porcine Valve Conduit;  Surgeon: Gaye Pollack, MD;  Location: [place];  Service: Open Heart Surgery;  Laterality: N/A;  RIGHT AXILLARY ARTERY CANNULATION  BILATERAL RADIAL ARTERIAL LINES  . CARDIAC CATHETERIZATION N/A 10/11/2016   Procedure: Right/Left Heart Cath and Coronary Angiography;  Surgeon: Burnell Blanks, MD;  Location: [place] [facility];  Service: Cardiovascular;  Laterality: N/A;  . CAROTID-SUBCLAVIAN BYPASS GRAFT Left 08/17/2017   Procedure: BYPASS HEMASHIELD GOLD GRAFT 72m x 30cm CAROTID-SUBCLAVIAN;  Surgeon: BSerafina Mitchell MD;  [place]  Service: Vascular;  Laterality: Left;  . COLONOSCOPY    . EMBOLIZATION  03/08/2019    RADIOLOGY WITH ANESTHESIA EMBOLIZATION   . IR AORTA/THORACIC  03/08/2019  . IR CT SPINE LTD  03/08/2019  . IR EMBO VENOUS NOT HEMORR HEMANG  INC GUIDE ROADMAPPING  03/08/2019  . IR RADIOLOGIST EVAL & MGMT  11/08/2018  . RADIOLOGY WITH ANESTHESIA N/A 03/08/2019   Procedure: RADIOLOGY WITH ANESTHESIA EMBOLIZATION;  Surgeon: MJacqulynn Cadet MD;  [place]  Service: Radiology;  Laterality: N/A;  . REPLACEMENT ASCENDING AORTA N/A 10/20/2016   Procedure: REPLACEMENT ASCENDING AORTA WITH ARCH RECONSTRUCTION-Aorto-Inominate bypass, Aorto-Carotid Bypass.. With Hypothermic circulatory arrest;  Surgeon: BGaye Pollack MD;  [place]  Service: Open Heart Surgery;  Laterality: N/A;  . TEE WITHOUT CARDIOVERSION N/A 10/20/2016   Procedure: TRANSESOPHAGEAL ECHOCARDIOGRAM (TEE);  Surgeon: BGaye Pollack MD;  [place]  Service: Open Heart Surgery;  Laterality: N/A;  . THORACIC AORTIC ENDOVASCULAR STENT GRAFT N/A 08/17/2017   Procedure: THORACIC AORTIC ENDOVASCULAR STENT GRAFT;  Surgeon: BSerafina Mitchell MD;  [place]  Service: Vascular;  Laterality: N/A;  . TUBAL LIGATION  1976    Current Outpatient Medications  Medication Sig Dispense Refill  . aspirin EC 81 MG tablet Take 81 mg by mouth daily.    .Marland Kitchenatorvastatin (LIPITOR) 20 MG  tablet Take 20 mg by mouth daily.    . cholecalciferol (VITAMIN D3) 25 MCG (1000 UT) tablet Take 1,000 Units by mouth daily.    . metoprolol tartrate (LOPRESSOR) 50 MG tablet Take 1 tablet (50 mg total) by mouth 2 (two) times daily. Please make overdue appt with Dr. Angelena Form before anymore refills. 3rd and Final Attempt 30 tablet 0  .  olmesartan (BENICAR) 40 MG tablet Take 1 tablet (40 mg total) by mouth daily. 90 tablet 3   No current facility-administered medications for this visit.    Allergies  Allergen Reactions  . Codeine Other (See Comments)    hallucinations  . Latex Rash    Social History   Socioeconomic History  . Marital status: Married    Spouse name: Not on file  . Number of children: 1  . Years of education: Not on file  . Highest education level: Not on file  Occupational History  . Occupation: Semi Retired-housework  Tobacco Use  . Smoking status: Former Smoker    Packs/day: 0.50    Years: 40.00    Pack years: 20.00    Types: Cigarettes    Start date: 10/06/1968    Quit date: 06/22/2008    Years since quitting: 11.3  . Smokeless tobacco: Never Used  Substance and Sexual Activity  . Alcohol use: No  . Drug use: No  . Sexual activity: Not on file  Other Topics Concern  . Not on file  Social History Narrative  . Not on file   Social Determinants of Health   Financial Resource Strain:   . Difficulty of Paying Living Expenses: Not on file  Food Insecurity:   . Worried About Charity fundraiser in the Last Year: Not on file  . Ran Out of Food in the Last Year: Not on file  Transportation Needs:   . Lack of Transportation (Medical): Not on file  . Lack of Transportation (Non-Medical): Not on file  Physical Activity:   . Days of Exercise per Week: Not on file  . Minutes of Exercise per Session: Not on file  Stress:   . Feeling of Stress : Not on file  Social Connections:   . Frequency of Communication with Friends and Family: Not on file  . Frequency of Social Gatherings with Friends and Family: Not on file  . Attends Religious Services: Not on file  . Active Member of Clubs or Organizations: Not on file  . Attends Archivist Meetings: Not on file  . Marital Status: Not on file  Intimate Partner Violence:   . Fear of Current or Ex-Partner: Not on file  . Emotionally  Abused: Not on file  . Physically Abused: Not on file  . Sexually Abused: Not on file    Family History  Problem Relation Age of Onset  . Colon cancer Neg Hx     Review of Systems:  As stated in the HPI and otherwise negative.   BP 140/90   Pulse 68   Ht '5\' 2"'  (1.575 m)   Wt 142 lb 12.8 oz (64.8 kg)   SpO2 93%   BMI 26.12 kg/m   Physical Examination:  General: Well developed, well nourished, NAD  HEENT: OP clear, mucus membranes moist  SKIN: warm, dry. No rashes. Neuro: No focal deficits  Musculoskeletal: Muscle strength 5/5 all ext  Psychiatric: Mood and affect normal  Neck: No JVD, no carotid bruits, no thyromegaly, no lymphadenopathy.  Lungs:Clear bilaterally, no wheezes, rhonci, crackles  Cardiovascular: Regular rate and rhythm. Systolic murmur noted.  Abdomen:Soft. Bowel sounds present. Non-tender.  Extremities: No lower extremity edema. Pulses are 2 + in the bilateral DP/PT.  Echo October 2019: - Left ventricle: The cavity size was normal. There was moderate   focal basal hypertrophy. Systolic function was normal. The   estimated ejection fraction was in the range of 55% to 60%. Wall   motion was normal; there were no regional wall motion   abnormalities. - Aortic valve: A bioprosthesis was present and functioning   normally. Mean gradient (S): 11 mm Hg. - Mitral valve: There was mild regurgitation. - Left atrium: The atrium was mildly dilated. - Right atrium: The atrium was mildly to moderately dilated. - Tricuspid valve: There was mild-moderate regurgitation. - Pulmonic valve: There was mild regurgitation. - Pulmonary arteries: PA peak pressure: 36 mm Hg (S). - Inferior vena cava: The vessel was mildly dilated.  EKG:  EKG is  ordered today. The ekg ordered today demonstrates NSR, RBBB  Recent Labs: 03/08/2019: BUN 21; Creatinine, Ser 1.08; Hemoglobin 11.2; Platelets 172; Potassium 3.8; Sodium 142   Lipid Panel No results found for: CHOL, TRIG, HDL,  CHOLHDL, VLDL, LDLCALC, LDLDIRECT   Wt Readings from Last 3 Encounters:  10/18/19 142 lb 12.8 oz (64.8 kg)  04/15/19 140 lb (63.5 kg)  03/08/19 139 lb 15.9 oz (63.5 kg)     Other studies Reviewed: Additional studies/ records that were reviewed today include: . Review of the above records demonstrates:    Assessment and Plan:   1. Thoracic aortic aneurysm She is s/p aortic arch replacement with Bentall procedure and porcine aortic valve replacement in January 2018. Repair of thoracic aortic endograft leak in June 2020. She is doing well. Will continue ASA and beta blocker.      2. HTN: BP is well controlled at home.   3. Aortic valve disease: She is s/p AVR with a bioprosthetic AVR (Bentall procedure). Echo October 2019 with normally functioning AVR.   Current medicines are reviewed at length with the patient today.  The patient does not have concerns regarding medicines.  The following changes have been made:  no change  Labs/ tests ordered today include:   Orders Placed This Encounter  Procedures  . EKG 12-Lead    Disposition:   Follow up with me in one year.   Signed, Lauree Chandler, MD 10/18/2019 8:58 AM    [facility] [address], [place], [place]  [address] Phone: [phone]; Fax: [phone]

## 2019-10-18 ENCOUNTER — Encounter: Payer: Self-pay | Admitting: Cardiovascular Disease

## 2019-10-18 ENCOUNTER — Other Ambulatory Visit: Payer: Self-pay

## 2019-10-18 ENCOUNTER — Encounter (INDEPENDENT_AMBULATORY_CARE_PROVIDER_SITE_OTHER): Payer: Self-pay

## 2019-10-18 ENCOUNTER — Ambulatory Visit: Payer: PPO | Admitting: Cardiovascular Disease

## 2019-10-18 ENCOUNTER — Telehealth (HOSPITAL_COMMUNITY): Payer: Self-pay

## 2019-10-18 VITALS — BP 140/90 | HR 68 | Ht 62.0 in | Wt 142.8 lb

## 2019-10-18 DIAGNOSIS — I712 Thoracic aortic aneurysm, without rupture, unspecified: Secondary | ICD-10-CM

## 2019-10-18 DIAGNOSIS — I359 Nonrheumatic aortic valve disorder, unspecified: Secondary | ICD-10-CM

## 2019-10-18 DIAGNOSIS — I10 Essential (primary) hypertension: Secondary | ICD-10-CM | POA: Diagnosis not present

## 2019-10-18 MED ORDER — OLMESARTAN MEDOXOMIL 40 MG PO TABS: 40.0000 mg | ORAL_TABLET | Freq: Every day | ORAL | 3 refills | Status: DC

## 2019-10-18 NOTE — Telephone Encounter (Signed)

## 2019-10-18 NOTE — Patient Instructions (Signed)

## 2019-10-21 ENCOUNTER — Ambulatory Visit (INDEPENDENT_AMBULATORY_CARE_PROVIDER_SITE_OTHER): Payer: PPO | Admitting: Surgery

## 2019-10-21 ENCOUNTER — Encounter: Payer: Self-pay | Admitting: Surgery

## 2019-10-21 ENCOUNTER — Other Ambulatory Visit: Payer: Self-pay

## 2019-10-21 VITALS — BP 193/97 | HR 67 | Temp 97.8°F | Resp 20 | Ht 62.0 in | Wt 143.0 lb

## 2019-10-21 DIAGNOSIS — I712 Thoracic aortic aneurysm, without rupture, unspecified: Secondary | ICD-10-CM

## 2019-10-21 NOTE — Progress Notes (Signed)
[facility] [place]  Patient name: Nancy Montoya MRN: [id number] DOB: 1946/02/10 Sex: female   REASON FOR VISIT:    Follow up  Cass:   Braylan Flicker a 74y.o.femalewho returns today forfollow up.. She is status post left carotid to left subclavian bypass graft with a 7 mm dacryon graft as well as endovascular repair of a5.1descending thoracic aortic aneurysm on 09-18-2017. Her postoperative course was uncomplicated.  She has a known type II endoleak.  Her last visit in January 2020 showed an increase in her aortic diameter to 5.9 cm.  She was referred to interventional radiology.  Attempted endoleak repair was performed on 03/09/2019 via a direct sac puncture.  No arterialized channel was identified and therefore no embolization was performed.  It was felt that the endoleak had spontaneously resolved.  The patient has a history of hypertension which has been difficult to control. She is a former smoker. She is on a statin for hypercholesterolemia. She has a history of vertigo but has not had an episode in a while. This did flareup after her operation.   PAST MEDICAL HISTORY:   Past Medical History:  Diagnosis Date  . Anemia   . Arthritis   . Blood transfusion without reported diagnosis 2009   anemia  . Constipation   . Hypertension   . Pneumonia    hx recent  . Thoracic aortic aneurysm without rupture ([place])      FAMILY HISTORY:   Family History  Problem Relation Age of Onset  . Colon cancer Neg Hx     SOCIAL HISTORY:   Social History   Tobacco Use  . Smoking status: Former Smoker    Packs/day: 0.50    Years: 40.00    Pack years: 20.00    Types: Cigarettes    Start date: 10/06/1968    Quit date: 06/22/2008    Years since quitting: 11.3  . Smokeless tobacco: Never Used  Substance Use Topics  . Alcohol use: No     ALLERGIES:   Allergies  Allergen Reactions  .  Codeine Other (See Comments)    hallucinations  . Latex Rash     CURRENT MEDICATIONS:   Current Outpatient Medications  Medication Sig Dispense Refill  . aspirin EC 81 MG tablet Take 81 mg by mouth daily.    Marland Kitchen atorvastatin (LIPITOR) 20 MG tablet Take 20 mg by mouth daily.    . cholecalciferol (VITAMIN D3) 25 MCG (1000 UT) tablet Take 1,000 Units by mouth daily.    . metoprolol tartrate (LOPRESSOR) 50 MG tablet Take 1 tablet (50 mg total) by mouth 2 (two) times daily. Please make overdue appt with Dr. Angelena Form before anymore refills. 3rd and Final Attempt 30 tablet 0  . olmesartan (BENICAR) 40 MG tablet Take 1 tablet (40 mg total) by mouth daily. 90 tablet 3   No current facility-administered medications for this visit.    REVIEW OF SYSTEMS:   [X]  denotes positive finding, [ ]  denotes negative finding Cardiac  Comments:  Chest pain or chest pressure:    Shortness of breath upon exertion:    Short of breath when lying flat:    Irregular heart rhythm:        Vascular    Pain in calf, thigh, or hip brought on by ambulation:    Pain in feet at night that wakes you up from your sleep:     Blood clot in your veins:    Leg  swelling:         Pulmonary    Oxygen at home:    Productive cough:     Wheezing:         Neurologic    Sudden weakness in arms or legs:     Sudden numbness in arms or legs:     Sudden onset of difficulty speaking or slurred speech:    Temporary loss of vision in one eye:     Problems with dizziness:         Gastrointestinal    Blood in stool:     Vomited blood:         Genitourinary    Burning when urinating:     Blood in urine:        Psychiatric    Major depression:         Hematologic    Bleeding problems:    Problems with blood clotting too easily:        Skin    Rashes or ulcers:        Constitutional    Fever or chills:      PHYSICAL EXAM:   Vitals:   10/21/19 1140  BP: (!) 193/97  Pulse: 67  Resp: 20  Temp: 97.8 F (36.6  C)  SpO2: 96%  Weight: 143 lb (64.9 kg)  Height: 5\' 2"  (1.575 m)    GENERAL: The patient is a well-nourished female, in no acute distress. The vital signs are documented above. CARDIAC: There is a regular rate and rhythm.  PULMONARY: Non-labored respirations ABDOMEN: Soft and non-tender with normal pitched bowel sounds.  MUSCULOSKELETAL: There are no major deformities or cyanosis. NEUROLOGIC: No focal weakness or paresthesias are detected. SKIN: There are no ulcers or rashes noted. PSYCHIATRIC: The patient has a normal affect.  STUDIES:   I have reviewed the following: Status post surgical repair of ascending thoracic aorta with stent graft extending from distal ascending thoracic aorta and through transverse aortic arch and descending thoracic aorta. Aneurysmal dilatation of the proximal descending thoracic aorta remains, with maximum measured diameter 6.2 cm which is slightly enlarged compared to prior exam. No definite evidence of significant endoleak is noted.  Stable incomplete apposition of distal end of stent graft in distal descending thoracic aorta is noted.   MEDICAL ISSUES:   TAAA: Today CT scan shows a slight increase in the maximum aortic diameter which is now 6.2 cm up from 5.9, 6 months ago.  No endoleak was visualized.  Her carotid subclavian bypass is widely patent.  I will plan on repeating her CT scan in 6 months.    [name], MD, FACS [facility] [facility] [phone] Pager [phone]

## 2019-10-30 ENCOUNTER — Encounter: Payer: Self-pay | Admitting: *Deleted

## 2019-10-30 ENCOUNTER — Ambulatory Visit
Admission: RE | Admit: 2019-10-30 | Discharge: 2019-10-30 | Disposition: A | Discharge status: Home / Self Care | Payer: PPO | Source: Ambulatory Visit | Attending: Interventional Radiology | Admitting: Interventional Radiology

## 2019-10-30 ENCOUNTER — Other Ambulatory Visit: Payer: Self-pay

## 2019-10-30 DIAGNOSIS — T82330A Leakage of aortic (bifurcation) graft (replacement), initial encounter: Secondary | ICD-10-CM

## 2019-10-30 DIAGNOSIS — IMO0002 Reserved for concepts with insufficient information to code with codable children: Secondary | ICD-10-CM

## 2019-10-30 HISTORY — PX: IR RADIOLOGIST EVAL & MGMT: IMG5224

## 2019-10-30 NOTE — Progress Notes (Signed)
Chief Complaint: Patient was consulted remotely today (TeleHealth) for TAA with type 2 endoleak at the request of [place].    Referring Physician(s): Serafina Mitchell, MD  History of Present Illness: Chantae Daisha Horse is a 74 y.o. 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  Subsequent follow-up demonstrated an enlargement in the excluded descending thoracic aortic aneurysm with a type II endoleak that appeared to be arising from a right-sided bronchial artery.  She underwent attempted endoleak repair via direct sac puncture on 03/08/2019.  At the time of the procedure, no arterialized channel or definitive endoleak could be identified and it was felt that the endoleak had resolved spontaneously.  She presents today for follow-up evaluation.  She had a CT arteriogram of the chest performed on 10/16/2019.  Her CTA demonstrates no evidence of type II endoleak on either the arterial or delayed images.  The report mentions perhaps slight interval growth of the excluded aneurysm sac, however by my measurements I detect no definitive interval change.  I spoke with Mrs. Kucera remotely over the telephone today and reviewed her imaging with her.  She reports that she has been doing excellent since the time of her procedure.  She has no significant active issues or complaints at this time.  Past Medical History:  Diagnosis Date  . Anemia   . Arthritis   . Blood transfusion without reported diagnosis 2009   anemia  . Constipation   . Hypertension   . Pneumonia    hx recent  . Thoracic aortic aneurysm without rupture [facility])     Past Surgical History:  Procedure Laterality Date  . BENTALL PROCEDURE N/A 10/20/2016   Procedure: BIOLOGIC BENTALL PROCEDURE -21 mm Medtronic Freestyle Porcine Valve Conduit;   Surgeon: Gaye Pollack, MD;  Location: [place];  Service: Open Heart Surgery;  Laterality: N/A;  RIGHT AXILLARY ARTERY CANNULATION  BILATERAL RADIAL ARTERIAL LINES  . CARDIAC CATHETERIZATION N/A 10/11/2016   Procedure: Right/Left Heart Cath and Coronary Angiography;  Surgeon: Burnell Blanks, MD;  Location: [place] CV LAB;  Service: Cardiovascular;  Laterality: N/A;  . CAROTID-SUBCLAVIAN BYPASS GRAFT Left 08/17/2017   Procedure: BYPASS HEMASHIELD GOLD GRAFT 30mm x 30cm CAROTID-SUBCLAVIAN;  Surgeon: Serafina Mitchell, MD;  Location: [place];  Service: Vascular;  Laterality: Left;  . COLONOSCOPY    . EMBOLIZATION  03/08/2019    RADIOLOGY WITH ANESTHESIA EMBOLIZATION   . IR AORTA/THORACIC  03/08/2019  . IR CT SPINE LTD  03/08/2019  . IR EMBO VENOUS NOT HEMORR HEMANG  INC GUIDE ROADMAPPING  03/08/2019  . IR RADIOLOGIST EVAL & MGMT  11/08/2018  . RADIOLOGY WITH ANESTHESIA N/A 03/08/2019   Procedure: RADIOLOGY WITH ANESTHESIA EMBOLIZATION;  Surgeon: Jacqulynn Cadet, MD;  Location: [place];  Service: Radiology;  Laterality: N/A;  . REPLACEMENT ASCENDING AORTA N/A 10/20/2016   Procedure: REPLACEMENT ASCENDING AORTA WITH ARCH RECONSTRUCTION-Aorto-Inominate bypass, Aorto-Carotid Bypass.. With Hypothermic circulatory arrest;  Surgeon: Gaye Pollack, MD;  Location: [place];  Service: Open Heart Surgery;  Laterality: N/A;  . TEE WITHOUT CARDIOVERSION N/A 10/20/2016   Procedure: TRANSESOPHAGEAL ECHOCARDIOGRAM (TEE);  Surgeon: Gaye Pollack, MD;  Location: [place];  Service: Open Heart Surgery;  Laterality: N/A;  . THORACIC AORTIC ENDOVASCULAR STENT GRAFT N/A 08/17/2017   Procedure: THORACIC AORTIC ENDOVASCULAR STENT GRAFT;  Surgeon: Serafina Mitchell, MD;  Location: [facility];  Service: Vascular;  Laterality: N/A;  . TUBAL LIGATION  1976    Allergies: Codeine and Latex  Medications: Prior to Admission medications   Medication Sig Start Date End Date Taking? Authorizing Provider  aspirin EC 81 MG tablet Take 81 mg  by mouth daily.    [provider]  atorvastatin (LIPITOR) 20 MG tablet Take 20 mg by mouth daily.    [provider]  cholecalciferol (VITAMIN D3) 25 MCG (1000 UT) tablet Take 1,000 Units by mouth daily.    [provider]  metoprolol tartrate (LOPRESSOR) 50 MG tablet Take 1 tablet (50 mg total) by mouth 2 (two) times daily. Please make overdue appt with Dr. Angelena Form before anymore refills. 3rd and Final Attempt 08/08/19   Burnell Blanks, MD  olmesartan (BENICAR) 40 MG tablet Take 1 tablet (40 mg total) by mouth daily. 10/18/19   Burnell Blanks, MD     Family History  Problem Relation Age of Onset  . Colon cancer Neg Hx     Social History   Socioeconomic History  . Marital status: Married    Spouse name: Not on file  . Number of children: 1  . Years of education: Not on file  . Highest education level: Not on file  Occupational History  . Occupation: Semi Retired-housework  Tobacco Use  . Smoking status: Former Smoker    Packs/day: 0.50    Years: 40.00    Pack years: 20.00    Types: Cigarettes    Start date: 10/06/1968    Quit date: 06/22/2008    Years since quitting: 11.3  . Smokeless tobacco: Never Used  Substance and Sexual Activity  . Alcohol use: No  . Drug use: No  . Sexual activity: Not on file  Other Topics Concern  . Not on file  Social History Narrative  . Not on file   Social Determinants of Health   Financial Resource Strain:   . Difficulty of Paying Living Expenses: Not on file  Food Insecurity:   . Worried About Charity fundraiser in the Last Year: Not on file  . Ran Out of Food in the Last Year: Not on file  Transportation Needs:   . Lack of Transportation (Medical): Not on file  . Lack of Transportation (Non-Medical): Not on file  Physical Activity:   . Days of Exercise per Week: Not on file  . Minutes of Exercise per Session: Not on file  Stress:   . Feeling of Stress : Not on file  Social Connections:    . Frequency of Communication with Friends and Family: Not on file  . Frequency of Social Gatherings with Friends and Family: Not on file  . Attends Religious Services: Not on file  . Active Member of Clubs or Organizations: Not on file  . Attends Archivist Meetings: Not on file  . Marital Status: Not on file    Review of Systems  Review of Systems: A 12 point ROS discussed and pertinent positives are indicated in the HPI above.  All other systems are negative.  Physical Exam No direct physical exam was performed (except for noted visual exam findings with Video Visits).    Vital Signs: There were no vitals taken for this visit.  Imaging: CT ANGIO CHEST *AORTA* W &/OR WO CONTRAST  Result Date: 10/17/2019 CLINICAL DATA:  Thoracic aortic aneurysm without rupture. EXAM: CT ANGIOGRAPHY CHEST WITH CONTRAST TECHNIQUE: Multidetector CT imaging of the chest was  performed using the standard protocol during bolus administration of intravenous contrast. Multiplanar CT image reconstructions and MIPs were obtained to evaluate the vascular anatomy. CONTRAST:  60mL ISOVUE-370 IOPAMIDOL (ISOVUE-370) INJECTION 76% COMPARISON:  April 08, 2019. FINDINGS: Cardiovascular: Stable position of stent graft that begins in ascending aorta and extends through transverse aortic arch and through the descending thoracic aorta. There appears to been reimplantation of the great vessels. Ascending thoracic aorta has maximum measured diameter of 3.2 cm. Transverse aortic arch measures 3.5 cm. Proximal descending thoracic aorta has maximum measured diameter of 6.2 cm which includes excluded aneurysmal sac. This is minimally enlarged compared to prior exam. Distal descending thoracic aorta measures 4.5 cm which is not significantly changed compared to prior exam. Stable cardiac size. No pericardial effusion. There is stable incomplete apposition of the distal aspect of the stent graft within the descending thoracic aorta.  No definite endoleak is noted. Mediastinum/Nodes: No enlarged mediastinal, hilar, or axillary lymph nodes. Thyroid gland, trachea, and esophagus demonstrate no significant findings. Lungs/Pleura: No pneumothorax or pleural effusion is noted. Stable bulla formation is noted laterally in the right upper lobe. No consolidative process is noted. Upper Abdomen: No acute abnormality. Musculoskeletal: Stable lipoma is noted in right anterior cervical region. No acute or significant osseous findings. Review of the MIP images confirms the above findings. IMPRESSION: Status post surgical repair of ascending thoracic aorta with stent graft extending from distal ascending thoracic aorta and through transverse aortic arch and descending thoracic aorta. Aneurysmal dilatation of the proximal descending thoracic aorta remains, with maximum measured diameter 6.2 cm which is slightly enlarged compared to prior exam. No definite evidence of significant endoleak is noted. Stable incomplete apposition of distal end of stent graft in distal descending thoracic aorta is noted. Electronically Signed   By: Marijo Conception M.D.   On: 10/17/2019 09:07    Labs:  CBC: Recent Labs    03/08/19 0742  WBC 6.2  HGB 11.2*  HCT 36.7  PLT 172    COAGS: Recent Labs    03/08/19 0742  INR 1.1    BMP: Recent Labs    03/08/19 0742  NA 142  K 3.8  CL 110  CO2 24  GLUCOSE 93  BUN 21  CALCIUM 9.3  CREATININE 1.08*  GFRNONAA 51*  GFRAA 59*    LIVER FUNCTION TESTS: No results for input(s): BILITOT, AST, ALT, ALKPHOS, PROT, ALBUMIN in the last 8760 hours.  TUMOR MARKERS: No results for input(s): AFPTM, CEA, CA199, CHROMGRNA in the last 8760 hours.  Assessment and Plan:  Mrs. Molenaar is doing exceptionally well.  I reviewed her imaging in detail.  I see no evidence of residual or recurrent type II endoleak.  The aneurysmal dissection of the thoracic aorta status post TEVAR demonstrates no significant interval growth by  my measurements.  Mrs. Rawles was excited to hear the news and stated that Dr. Trula Slade told her the same.  At this point, Mrs. Klopp can continue further follow-ups as scheduled with Dr. Trula Slade.  If she develops a recurrent endoleak or problem in the future I will be happy to see her on an as-needed basis.  No further scheduled follow-ups at this time.    Electronically Signed: Jacqulynn Cadet 10/30/2019, 11:44 AM   I spent a total of  10 Minutes in remote  clinical consultation, greater than 50% of which was counseling/coordinating care for TEVAR with type 2 endoleak.    Visit type: Audio only (telephone). Audio (no video) only due  to patient request. Alternative for in-person consultation at [facility], [address], [place], [place]. This visit type was conducted due to national recommendations for restrictions regarding the COVID-19 Pandemic (e.g. social distancing).  This format is felt to be most appropriate for this patient at this time.  All issues noted in this document were discussed and addressed.

## 2020-02-21 DIAGNOSIS — H40013 Open angle with borderline findings, low risk, bilateral: Secondary | ICD-10-CM | POA: Diagnosis not present

## 2020-02-25 DIAGNOSIS — D692 Other nonthrombocytopenic purpura: Secondary | ICD-10-CM | POA: Diagnosis not present

## 2020-02-25 DIAGNOSIS — M1990 Unspecified osteoarthritis, unspecified site: Secondary | ICD-10-CM | POA: Diagnosis not present

## 2020-02-25 DIAGNOSIS — I6529 Occlusion and stenosis of unspecified carotid artery: Secondary | ICD-10-CM | POA: Diagnosis not present

## 2020-02-25 DIAGNOSIS — E441 Mild protein-calorie malnutrition: Secondary | ICD-10-CM | POA: Diagnosis not present

## 2020-02-25 DIAGNOSIS — R809 Proteinuria, unspecified: Secondary | ICD-10-CM | POA: Diagnosis not present

## 2020-02-25 DIAGNOSIS — E7800 Pure hypercholesterolemia, unspecified: Secondary | ICD-10-CM | POA: Diagnosis not present

## 2020-02-25 DIAGNOSIS — Z1331 Encounter for screening for depression: Secondary | ICD-10-CM | POA: Diagnosis not present

## 2020-02-25 DIAGNOSIS — Z952 Presence of prosthetic heart valve: Secondary | ICD-10-CM | POA: Diagnosis not present

## 2020-02-25 DIAGNOSIS — N182 Chronic kidney disease, stage 2 (mild): Secondary | ICD-10-CM | POA: Diagnosis not present

## 2020-02-25 DIAGNOSIS — I1310 Hypertensive heart and chronic kidney disease without heart failure, with stage 1 through stage 4 chronic kidney disease, or unspecified chronic kidney disease: Secondary | ICD-10-CM | POA: Diagnosis not present

## 2020-02-25 DIAGNOSIS — I712 Thoracic aortic aneurysm, without rupture: Secondary | ICD-10-CM | POA: Diagnosis not present

## 2020-02-27 DIAGNOSIS — H40013 Open angle with borderline findings, low risk, bilateral: Secondary | ICD-10-CM | POA: Diagnosis not present

## 2020-04-20 ENCOUNTER — Other Ambulatory Visit: Payer: Self-pay

## 2020-04-20 DIAGNOSIS — I712 Thoracic aortic aneurysm, without rupture, unspecified: Secondary | ICD-10-CM

## 2020-05-12 ENCOUNTER — Ambulatory Visit
Admission: RE | Admit: 2020-05-12 | Discharge: 2020-05-12 | Disposition: A | Discharge status: Home / Self Care | Payer: PPO | Source: Ambulatory Visit | Attending: Surgery | Admitting: Surgery

## 2020-05-12 ENCOUNTER — Other Ambulatory Visit: Payer: Self-pay

## 2020-05-12 DIAGNOSIS — I712 Thoracic aortic aneurysm, without rupture, unspecified: Secondary | ICD-10-CM

## 2020-05-12 DIAGNOSIS — I714 Abdominal aortic aneurysm, without rupture: Secondary | ICD-10-CM | POA: Diagnosis not present

## 2020-05-12 DIAGNOSIS — I10 Essential (primary) hypertension: Secondary | ICD-10-CM | POA: Diagnosis not present

## 2020-05-12 DIAGNOSIS — I716 Thoracoabdominal aortic aneurysm, without rupture: Secondary | ICD-10-CM | POA: Diagnosis not present

## 2020-05-12 MED ORDER — IOPAMIDOL (ISOVUE-370) INJECTION 76%
75.0000 mL | Freq: Once | INTRAVENOUS | Status: AC | PRN
  Administered 2020-05-12: 75 mL via INTRAVENOUS

## 2020-05-18 ENCOUNTER — Ambulatory Visit (INDEPENDENT_AMBULATORY_CARE_PROVIDER_SITE_OTHER): Payer: PPO | Admitting: Surgery

## 2020-05-18 ENCOUNTER — Encounter: Payer: Self-pay | Admitting: Surgery

## 2020-05-18 ENCOUNTER — Other Ambulatory Visit: Payer: Self-pay

## 2020-05-18 VITALS — BP 210/83 | HR 59 | Temp 97.7°F | Resp 20 | Ht 62.0 in | Wt 142.0 lb

## 2020-05-18 DIAGNOSIS — I712 Thoracic aortic aneurysm, without rupture, unspecified: Secondary | ICD-10-CM

## 2020-05-18 NOTE — Progress Notes (Signed)
[facility] [place]  Patient name: Nancy Montoya MRN: [id number] DOB: [birth year] Sex: female   REASON FOR VISIT:    Follow up  Nancy Montoya:   Nancy Montoya a 74y.o.femalewho returns today forfollow up.. She is status post left carotid to left subclavian bypass graft with a 7 mm dacryon graft as well as endovascular repair of a5.1descending thoracic aortic aneurysm on 09-18-2017. Her postoperative course was uncomplicated.She has a known type II endoleak. Her last visit in January 2020 showed an increase in her aortic diameter to 5.9 cm. She was referred to interventional radiology. Attempted endoleak repair was performed on 03/09/2019 via a direct sac puncture. No arterialized channel was identified and therefore no embolization was performed. It was felt that the endoleak had spontaneously resolved.  She is back for follow up to discuss her CTA  The patient has a history of hypertension which has been difficult to control. She is a former smoker. She is on a statin for hypercholesterolemia. She has a history of vertigo. This did flareup after her operation, and she continues to struggle with it   PAST MEDICAL HISTORY:   Past Medical History:  Diagnosis Date   Anemia    Arthritis    Blood transfusion without reported diagnosis 2009   anemia   Constipation    Hypertension    Pneumonia    hx recent   Thoracic aortic aneurysm without rupture (Nancy Montoya)      FAMILY HISTORY:   Family History  Problem Relation Age of Onset   Colon cancer Neg Hx     SOCIAL HISTORY:   Social History   Tobacco Use   Smoking status: Former Smoker    Packs/day: 0.50    Years: 40.00    Pack years: 20.00    Types: Cigarettes    Start date: 10/06/1968    Quit date: 06/22/2008    Years since quitting: 11.9   Smokeless tobacco: Never Used  Substance Use Topics   Alcohol use: No      ALLERGIES:   Allergies  Allergen Reactions   Codeine Other (See Comments)    hallucinations   Latex Rash     CURRENT MEDICATIONS:   Current Outpatient Medications  Medication Sig Dispense Refill   aspirin EC 81 MG tablet Take 81 mg by mouth daily.     atorvastatin (LIPITOR) 20 MG tablet Take 20 mg by mouth daily.     cholecalciferol (VITAMIN D3) 25 MCG (1000 UT) tablet Take 1,000 Units by mouth daily.     metoprolol tartrate (LOPRESSOR) 50 MG tablet Take 1 tablet (50 mg total) by mouth 2 (two) times daily. Please make overdue appt with Dr. Angelena Form before anymore refills. 3rd and Final Attempt 30 tablet 0   olmesartan (BENICAR) 40 MG tablet Take 1 tablet (40 mg total) by mouth daily. 90 tablet 3   No current facility-administered medications for this visit.    REVIEW OF SYSTEMS:   [X]  denotes positive finding, [ ]  denotes negative finding Cardiac  Comments:  Chest pain or chest pressure:    Shortness of breath upon exertion:    Short of breath when lying flat:    Irregular heart rhythm:        Vascular    Pain in calf, thigh, or hip brought on by ambulation:    Pain in feet at night that wakes you up from your sleep:     Blood clot in your veins:  Leg swelling:         Pulmonary    Oxygen at home:    Productive cough:     Wheezing:         Neurologic    Sudden weakness in arms or legs:     Sudden numbness in arms or legs:     Sudden onset of difficulty speaking or slurred speech:    Temporary loss of vision in one eye:     Problems with dizziness:  x       Gastrointestinal    Blood in stool:     Vomited blood:         Genitourinary    Burning when urinating:     Blood in urine:        Psychiatric    Major depression:         Hematologic    Bleeding problems:    Problems with blood clotting too easily:        Skin    Rashes or ulcers:        Constitutional    Fever or chills:      PHYSICAL EXAM:   Vitals:   05/18/20 1011  05/18/20 1012  BP: (!) 208/104 (!) 210/83  Pulse: (!) 59   Resp: 20   Temp: 97.7 F (36.5 C)   SpO2: 97%   Weight: 142 lb (64.4 kg)   Height: 5\' 2"  (1.575 m)     GENERAL: The patient is a well-nourished female, in no acute distress. The vital signs are documented above. CARDIAC: There is a regular rate and rhythm.  VASCULAR: palpable left radial pulse PULMONARY: Non-labored respirations ABDOMEN: Soft and non-tender with normal pitched bowel sounds.  MUSCULOSKELETAL: There are no major deformities or cyanosis. NEUROLOGIC: No focal weakness or paresthesias are detected. SKIN: There are no ulcers or rashes noted. PSYCHIATRIC: The patient has a normal affect.  STUDIES:   I have reviewed the following CTA: 1. Stable appearance of the stent graft without evidence of endoleak. 2. Stable appearance of descending thoracic aortic aneurysm measuring up to 5.4 cm in greatest dimension. 3. 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 4. Stable postsurgical changes of aortic valve annuloplasty, tube graft repair of the distal ascending aorta, aortic arch, and descending thoracic aorta. 5. 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  MEDICAL ISSUES:   Maximum thoracic aortic diameter is 6.0 cm with no evidence of endoleak.  There has been no change in size of her aneurysm.  I reviewed and compared scans with Dr. Laurence Montoya today.  I will repeat her CTA again in 1 year.  I will also get a carotid duplex  AAA:  This will be evaluated with next CTA  Nancy Alf, MD, FACS [facility] [phone] Pager [phone]

## 2020-07-06 ENCOUNTER — Other Ambulatory Visit: Payer: Self-pay | Admitting: Internal Medicine

## 2020-07-06 DIAGNOSIS — Z1231 Encounter for screening mammogram for malignant neoplasm of breast: Secondary | ICD-10-CM

## 2020-07-08 DIAGNOSIS — Z23 Encounter for immunization: Secondary | ICD-10-CM | POA: Diagnosis not present

## 2020-08-06 ENCOUNTER — Ambulatory Visit: Payer: PPO

## 2020-08-13 DIAGNOSIS — E559 Vitamin D deficiency, unspecified: Secondary | ICD-10-CM | POA: Diagnosis not present

## 2020-08-13 DIAGNOSIS — E7800 Pure hypercholesterolemia, unspecified: Secondary | ICD-10-CM | POA: Diagnosis not present

## 2020-08-20 DIAGNOSIS — I1310 Hypertensive heart and chronic kidney disease without heart failure, with stage 1 through stage 4 chronic kidney disease, or unspecified chronic kidney disease: Secondary | ICD-10-CM | POA: Diagnosis not present

## 2020-08-20 DIAGNOSIS — Z95828 Presence of other vascular implants and grafts: Secondary | ICD-10-CM | POA: Diagnosis not present

## 2020-08-20 DIAGNOSIS — R82998 Other abnormal findings in urine: Secondary | ICD-10-CM | POA: Diagnosis not present

## 2020-08-20 DIAGNOSIS — I700 Atherosclerosis of aorta: Secondary | ICD-10-CM | POA: Diagnosis not present

## 2020-08-20 DIAGNOSIS — M8580 Other specified disorders of bone density and structure, unspecified site: Secondary | ICD-10-CM | POA: Diagnosis not present

## 2020-08-20 DIAGNOSIS — Z0000 Encounter for general adult medical examination without abnormal findings: Secondary | ICD-10-CM | POA: Diagnosis not present

## 2020-08-20 DIAGNOSIS — E7800 Pure hypercholesterolemia, unspecified: Secondary | ICD-10-CM | POA: Diagnosis not present

## 2020-08-20 DIAGNOSIS — I6529 Occlusion and stenosis of unspecified carotid artery: Secondary | ICD-10-CM | POA: Diagnosis not present

## 2020-08-20 DIAGNOSIS — I712 Thoracic aortic aneurysm, without rupture: Secondary | ICD-10-CM | POA: Diagnosis not present

## 2020-08-20 DIAGNOSIS — R809 Proteinuria, unspecified: Secondary | ICD-10-CM | POA: Diagnosis not present

## 2020-08-20 DIAGNOSIS — Z952 Presence of prosthetic heart valve: Secondary | ICD-10-CM | POA: Diagnosis not present

## 2020-08-20 DIAGNOSIS — E441 Mild protein-calorie malnutrition: Secondary | ICD-10-CM | POA: Diagnosis not present

## 2020-08-20 DIAGNOSIS — N182 Chronic kidney disease, stage 2 (mild): Secondary | ICD-10-CM | POA: Diagnosis not present

## 2020-08-26 ENCOUNTER — Other Ambulatory Visit: Payer: Self-pay

## 2020-08-26 ENCOUNTER — Ambulatory Visit
Admission: RE | Admit: 2020-08-26 | Discharge: 2020-08-26 | Disposition: A | Discharge status: Home / Self Care | Payer: PPO | Source: Ambulatory Visit | Attending: Internal Medicine | Admitting: Internal Medicine

## 2020-08-26 DIAGNOSIS — Z1231 Encounter for screening mammogram for malignant neoplasm of breast: Secondary | ICD-10-CM | POA: Diagnosis not present

## 2020-08-26 DIAGNOSIS — Z1212 Encounter for screening for malignant neoplasm of rectum: Secondary | ICD-10-CM | POA: Diagnosis not present

## 2020-09-14 DIAGNOSIS — H40013 Open angle with borderline findings, low risk, bilateral: Secondary | ICD-10-CM | POA: Diagnosis not present

## 2020-12-08 ENCOUNTER — Ambulatory Visit: Payer: PPO | Admitting: Cardiovascular Disease

## 2020-12-08 ENCOUNTER — Encounter (INDEPENDENT_AMBULATORY_CARE_PROVIDER_SITE_OTHER): Payer: Self-pay

## 2020-12-08 ENCOUNTER — Other Ambulatory Visit: Payer: Self-pay

## 2020-12-08 ENCOUNTER — Encounter: Payer: Self-pay | Admitting: Cardiovascular Disease

## 2020-12-08 VITALS — BP 195/98 | HR 69 | Ht 62.0 in | Wt 145.0 lb

## 2020-12-08 DIAGNOSIS — I712 Thoracic aortic aneurysm, without rupture, unspecified: Secondary | ICD-10-CM

## 2020-12-08 DIAGNOSIS — I359 Nonrheumatic aortic valve disorder, unspecified: Secondary | ICD-10-CM

## 2020-12-08 DIAGNOSIS — I10 Essential (primary) hypertension: Secondary | ICD-10-CM | POA: Diagnosis not present

## 2020-12-08 MED ORDER — METOPROLOL TARTRATE 50 MG PO TABS: 50.0000 mg | ORAL_TABLET | Freq: Two times a day (BID) | ORAL | 3 refills | Status: DC

## 2020-12-08 MED ORDER — ATORVASTATIN CALCIUM 20 MG PO TABS: 20.0000 mg | ORAL_TABLET | Freq: Every day | ORAL | 3 refills | Status: DC

## 2020-12-08 MED ORDER — OLMESARTAN MEDOXOMIL 40 MG PO TABS: 40.0000 mg | ORAL_TABLET | Freq: Every day | ORAL | 3 refills | Status: DC

## 2020-12-08 NOTE — Progress Notes (Signed)
Chief Complaint  Patient presents with  . Follow-up    Aortic valve disease    History of Present Illness: 75 yo female with history of HTN and thoracic aortic aneurysm who is here today for cardiac follow up. I met her in January 2018 to discuss a cardiac cath which was required as part of her pre-operative workup before planned replacement of her aorta. Cardiac cath 10/11/16 with no evidence of CAD. She underwent replacement of the aortic arch with aorto-innominate and aorto-left carotid bypass with Bentall procedure using Medtronic 21 mm Freestyle porcine root on 10/20/16. She then underwent left carotid to left subclavian bypass with endovascular repair of the descending thoracic aortic aneurysm in November 2018. She has done well since then. Echo October 2019 with LVEF=55-60%. The bioprosthetic AVR was working well. Mild MR. Endoleak repair of thoracic aortic graft June 2020 per interventional radiology. She is followed in [facility] by Dr. Trula Slade.   She is here today for follow up. The patient denies any chest pain, dyspnea, palpitations, lower extremity edema, orthopnea, PND, dizziness, near syncope or syncope. She has had a few headaches over the past month. Her blood pressure has been ok at home.   Primary Care Physician: Haywood Pao, MD  Past Medical History:  Diagnosis Date  . Anemia   . Arthritis   . Blood transfusion without reported diagnosis 2009   anemia  . Constipation   . Hypertension   . Pneumonia    hx recent  . Thoracic aortic aneurysm without rupture [facility])     Past Surgical History:  Procedure Laterality Date  . BENTALL PROCEDURE N/A 10/20/2016   Procedure: BIOLOGIC BENTALL PROCEDURE -21 mm Medtronic Freestyle Porcine Valve Conduit;  Surgeon: Gaye Pollack, MD;  Location: [place];  Service: Open Heart Surgery;  Laterality: N/A;  RIGHT AXILLARY ARTERY CANNULATION  BILATERAL RADIAL ARTERIAL LINES  . CARDIAC CATHETERIZATION N/A 10/11/2016   Procedure: Right/Left Heart  Cath and Coronary Angiography;  Surgeon: Burnell Blanks, MD;  Location: [place] [facility];  Service: Cardiovascular;  Laterality: N/A;  . CAROTID-SUBCLAVIAN BYPASS GRAFT Left 08/17/2017   Procedure: BYPASS HEMASHIELD GOLD GRAFT 58m x 30cm CAROTID-SUBCLAVIAN;  Surgeon: BSerafina Mitchell MD;  [place]  Service: Vascular;  Laterality: Left;  . COLONOSCOPY    . EMBOLIZATION  03/08/2019    RADIOLOGY WITH ANESTHESIA EMBOLIZATION   . IR AORTA/THORACIC  03/08/2019  . IR CT SPINE LTD  03/08/2019  . IR EMBO VENOUS NOT HEMORR HEMANG  INC GUIDE ROADMAPPING  03/08/2019  . IR RADIOLOGIST EVAL & MGMT  11/08/2018  . IR RADIOLOGIST EVAL & MGMT  10/30/2019  . RADIOLOGY WITH ANESTHESIA N/A 03/08/2019   Procedure: RADIOLOGY WITH ANESTHESIA EMBOLIZATION;  Surgeon: MJacqulynn Cadet MD;  [place]  Service: Radiology;  Laterality: N/A;  . REPLACEMENT ASCENDING AORTA N/A 10/20/2016   Procedure: REPLACEMENT ASCENDING AORTA WITH ARCH RECONSTRUCTION-Aorto-Inominate bypass, Aorto-Carotid Bypass.. With Hypothermic circulatory arrest;  Surgeon: BGaye Pollack MD;  [place]  Service: Open Heart Surgery;  Laterality: N/A;  . TEE WITHOUT CARDIOVERSION N/A 10/20/2016   Procedure: TRANSESOPHAGEAL ECHOCARDIOGRAM (TEE);  Surgeon: BGaye Pollack MD;  [place]  Service: Open Heart Surgery;  Laterality: N/A;  . THORACIC AORTIC ENDOVASCULAR STENT GRAFT N/A 08/17/2017   Procedure: THORACIC AORTIC ENDOVASCULAR STENT GRAFT;  Surgeon: BSerafina Mitchell MD;  [place]  Service: Vascular;  Laterality: N/A;  . TUBAL LIGATION  1976    Current Outpatient Medications  Medication Sig Dispense Refill  . aspirin EC 81 MG tablet Take 81 mg by mouth daily.    Marland Kitchen atorvastatin (LIPITOR) 20 MG tablet Take 1 tablet (20 mg total) by mouth daily. 90 tablet 3  . cholecalciferol (VITAMIN D3) 25 MCG (1000 UT) tablet Take 1,000 Units by mouth daily.    . metoprolol tartrate (LOPRESSOR) 50 MG tablet Take 1 tablet (50  mg total) by mouth 2 (two) times daily. 180 tablet 3  . olmesartan (BENICAR) 40 MG tablet Take 1 tablet (40 mg total) by mouth daily. 90 tablet 3   No current facility-administered medications for this visit.    Allergies  Allergen Reactions  . Codeine Other (See Comments)    hallucinations  . Latex Rash    Social History   Socioeconomic History  . Marital status: Married    Spouse name: Not on file  . Number of children: 1  . Years of education: Not on file  . Highest education level: Not on file  Occupational History  . Occupation: Semi Retired-housework  Tobacco Use  . Smoking status: Former Smoker    Packs/day: 0.50    Years: 40.00    Pack years: 20.00    Types: Cigarettes    Start date: 10/06/1968    Quit date: 06/22/2008    Years since quitting: 12.4  . Smokeless tobacco: Never Used  Vaping Use  . Vaping Use: Never used  Substance and Sexual Activity  . Alcohol use: No  . Drug use: No  . Sexual activity: Not on file  Other Topics Concern  . Not on file  Social History Narrative  . Not on file   Social Determinants of Health   Financial Resource Strain: Not on file  Food Insecurity: Not on file  Transportation Needs: Not on file  Physical Activity: Not on file  Stress: Not on file  Social Connections: Not on file  Intimate Partner Violence: Not on file    Family History  Problem Relation Age of Onset  . Colon cancer Neg Hx     Review of Systems:  As stated in the HPI and otherwise negative.   BP (!) 195/98   Pulse 69   Ht '5\' 2"'  (1.575 m)   Wt 145 lb (65.8 kg)   SpO2 96%   BMI 26.52 kg/m   Physical Examination: General: Well developed, well nourished, NAD  HEENT: OP clear, mucus membranes moist  SKIN: warm, dry. No rashes. Neuro: No focal deficits  Musculoskeletal: Muscle strength 5/5 all ext  Psychiatric: Mood and affect normal  Neck: No JVD, no carotid bruits, no thyromegaly, no lymphadenopathy.  Lungs:Clear bilaterally, no wheezes,  rhonci, crackles Cardiovascular: Regular rate and rhythm. No murmurs, gallops or rubs. Abdomen:Soft. Bowel sounds present. Non-tender.  Extremities: No lower extremity edema. Pulses are 2 + in the bilateral DP/PT.  Echo October 2019: - Left ventricle: The cavity size was normal. There was moderate   focal basal hypertrophy. Systolic function was normal. The   estimated ejection fraction was in the range of 55% to 60%. Wall   motion was normal; there were no regional wall motion   abnormalities. - Aortic valve: A bioprosthesis was present and functioning   normally. Mean gradient (S): 11 mm Hg. - Mitral valve: There was mild regurgitation. - Left atrium: The atrium was mildly dilated. - Right atrium: The atrium was mildly to moderately dilated. - Tricuspid valve: There was mild-moderate regurgitation. - Pulmonic valve: There was mild regurgitation. - Pulmonary  arteries: PA peak pressure: 36 mm Hg (S). - Inferior vena cava: The vessel was mildly dilated.  EKG:  EKG is ordered today. The ekg ordered today demonstrates sinus, RBBB  Recent Labs: No results found for requested labs within last 8760 hours.   Lipid Panel No results found for: CHOL, TRIG, HDL, CHOLHDL, VLDL, LDLCALC, LDLDIRECT   Wt Readings from Last 3 Encounters:  12/08/20 145 lb (65.8 kg)  05/18/20 142 lb (64.4 kg)  10/21/19 143 lb (64.9 kg)     Other studies Reviewed: Additional studies/ records that were reviewed today include: . Review of the above records demonstrates:    Assessment and Plan:   1. Thoracic aortic aneurysm She is s/p aortic arch replacement with Bentall procedure and porcine aortic valve replacement in January 2018. Repair of thoracic aortic endograft leak in June 2020. No complaints. Continue ASA and beta blocker. SBE prophylaxis as needed.   2. HTN: BP is controlled at home but elevated here today. She has a history of white coat HTN. She will follow her BP closely at home over the next  few weeks and call if elevated.   3. Aortic valve disease: She is s/p AVR with a bioprosthetic AVR (Bentall procedure). Echo October 2019 with normally functioning AVR. Repeat echo in October 2022.   Current medicines are reviewed at length with the patient today.  The patient does not have concerns regarding medicines.  The following changes have been made:  no change  Labs/ tests ordered today include:   Orders Placed This Encounter  Procedures  . ECHOCARDIOGRAM COMPLETE   Disposition:   Follow up with me in one year.   Signed, Lauree Chandler, MD 12/08/2020 3:21 PM    [facility] [place], [place],   [address] Phone: [phone]; Fax: [phone]

## 2020-12-08 NOTE — Patient Instructions (Addendum)
Medication Instructions:  No changes *If you need a refill on your cardiac medications before your next appointment, please call your pharmacy*   Lab Work: None  If you have labs (blood work) drawn today and your tests are completely normal, you will receive your results only by: Marland Kitchen MyChart Message (if you have MyChart) OR . A paper copy in the mail If you have any lab test that is abnormal or we need to change your treatment, we will call you to review the results.   Testing/Procedures:ECHO DUE IN OCTOBER Your physician has requested that you have an echocardiogram. Echocardiography is a painless test that uses sound waves to create images of your heart. It provides your doctor with information about the size and shape of your heart and how well your heart's chambers and valves are working. This procedure takes approximately one hour. There are no restrictions for this procedure.   Follow-Up: At [facility], you and your health needs are our priority.  As part of our continuing mission to provide you with exceptional heart care, we have created designated Provider Care Teams.  These Care Teams include your primary Cardiologist (physician) and Advanced Practice Providers (APPs -  Physician Assistants and Nurse Practitioners) who all work together to provide you with the care you need, when you need it.  We recommend signing up for the patient portal called "MyChart".  Sign up information is provided on this After Visit Summary.  MyChart is used to connect with patients for Virtual Visits (Telemedicine).  Patients are able to view lab/test results, encounter notes, upcoming appointments, etc.  Non-urgent messages can be sent to your provider as well.   To learn more about what you can do with MyChart, go to [email or web address].    Your next appointment:   12 month(s)  The format for your next appointment:   In Person  Provider:   You may see Lauree Chandler, MD or one of the  following Advanced Practice Providers on your designated Care Team:    Melina Copa, PA-C  Ermalinda Barrios, PA-C   Other Instructions

## 2020-12-10 NOTE — Addendum Note (Signed)
Addended by: Mendel Ryder on: 12/10/2020 02:11 PM   Modules accepted: Orders

## 2020-12-24 ENCOUNTER — Telehealth: Payer: Self-pay | Admitting: Cardiovascular Disease

## 2020-12-24 ENCOUNTER — Telehealth: Payer: Self-pay | Admitting: Student

## 2020-12-24 DIAGNOSIS — I10 Essential (primary) hypertension: Secondary | ICD-10-CM

## 2020-12-24 MED ORDER — CARVEDILOL 6.25 MG PO TABS: 6.2500 mg | ORAL_TABLET | Freq: Two times a day (BID) | ORAL | 2 refills | Status: DC

## 2020-12-24 MED ORDER — CARVEDILOL 12.5 MG PO TABS: 12.5000 mg | ORAL_TABLET | Freq: Two times a day (BID) | ORAL | 2 refills | Status: DC

## 2020-12-24 MED ORDER — AMLODIPINE BESYLATE 5 MG PO TABS: 5.0000 mg | ORAL_TABLET | Freq: Every day | ORAL | 2 refills | Status: DC

## 2020-12-24 MED ORDER — AMLODIPINE BESYLATE 5 MG PO TABS
5.0000 mg | ORAL_TABLET | Freq: Every day | ORAL | 2 refills | Status: DC
Start: 2020-12-24 — End: 2020-12-24

## 2020-12-24 NOTE — Telephone Encounter (Signed)
   Received page from Answering Service that patient had concerns about elevated BP. Patient called our office earlier today with same concern she was advised to take an extra dose of Lopressor and then recheck BP in a couple of hours (please see phone note from earlier today for more information). She did as she was instructed but BP still elevated. BP 218/108 and then 191/100 on repeat. Patient is completely asymptomatic. No chest pain, shortness of breath, headache, or stroke like symptoms. I asked patient to repeat BP while on the phone and it was 229/144 (heart rate 69). Again she denies any symptoms. Advised patient that it was OK to go ahead and take evening dose of Lopressor as previously instructed earlier today. Then advised patient to just sit and relax the rest of the evening. Will stop Lopressor and have patient start Coreg 12.5mg  twice daily as well as Amlodipine 5mg  tomorrow morning for better BP control. Continue Olmesartan 40mg  daily. Advised patient to keep a close eye on her BP for the next week and let us know if BP remains elevated with these changes. Emphasized importance of being seen in the ED if patient were to develop chest pain, acute shortness of breath, or any stroke like symptoms. He voiced understanding and agreed.  I did discuss above recommendations with no-call MD (Dr. Debara Pickett) who was in agreement.   Will route note to Dr. Angelena Form so that he is aware in case he has any additional recommendations.  Darreld Mclean, PA-C 12/24/2020 8:13 PM

## 2020-12-24 NOTE — Telephone Encounter (Signed)
She has been monitoring BP at home.  She doesn't know what the readings are but the top number has been high.  BP 218/108 today 4:15 pm.  While on the phone  169/122, 79  Repeat is 225/114, HR 79,  Pt denies headache, vision changes, or dizziness.  No other other complaints.  Asked her to take an extra metoprolol tartrate 50 mg now, lie down and then check BP in 2 hours again and call on call provider with reading.   Adv of ER precautions for any symptoms/changes at all in the meantime.  She knows to still take her evening dose of lopressor at bedtime.  Aware I will forward to Dr. Angelena Form for review and further recommendations.

## 2020-12-24 NOTE — Telephone Encounter (Signed)
My blood pressure today is 218/108.  On yesterday had a slight h/a.   Please advise.

## 2020-12-25 NOTE — Telephone Encounter (Signed)
Agree. Thanks

## 2020-12-30 ENCOUNTER — Ambulatory Visit: Payer: PPO | Admitting: Cardiovascular Disease

## 2021-01-01 ENCOUNTER — Telehealth: Payer: Self-pay | Admitting: Cardiovascular Disease

## 2021-01-01 ENCOUNTER — Other Ambulatory Visit: Payer: Self-pay | Admitting: Student

## 2021-01-01 DIAGNOSIS — I10 Essential (primary) hypertension: Secondary | ICD-10-CM

## 2021-01-01 NOTE — Telephone Encounter (Signed)
Spoke with the patient and advised her that I will send her BP readings over to Dr. Angelena Form and to continue on her current medications.

## 2021-01-01 NOTE — Telephone Encounter (Signed)
  Pt c/o BP issue: STAT if pt c/o blurred vision, one-sided weakness or slurred speech  1. What are your last 5 BP readings?   4/1  134/80 am 3/31 127/77 am   122/71 pm 3/30 138/80 am 120/80 pm 3/29 149/88 am 133/73 pm 3/28 161/88 am  147/75 pm   2. Are you having any other symptoms (ex. Dizziness, headache, blurred vision, passed out)? no  3. What is your BP issue? Patient was updating doctor on her BP readings since the change was made to her medication.

## 2021-01-04 NOTE — Telephone Encounter (Signed)
No change for now. BP looks better. Nancy Montoya

## 2021-01-04 NOTE — Telephone Encounter (Signed)
Left message on vm - no changes, bp looks better, call back if any questions.

## 2021-01-27 ENCOUNTER — Telehealth: Payer: Self-pay | Admitting: Cardiovascular Disease

## 2021-01-27 NOTE — Telephone Encounter (Signed)
Pt aware to continue current treatment plan for now. Continue monitoring and let us know if worsens. Pt agreeable to plan.

## 2021-01-27 NOTE — Telephone Encounter (Signed)
I would not change anything if there is toe swelling. If she has increase in swelling or weight gain, we can re-evaluate. Gerald Stabs

## 2021-01-27 NOTE — Telephone Encounter (Signed)
Patient is concern about the swelling in her feet.  BP today was 135/79.

## 2021-01-27 NOTE — Telephone Encounter (Addendum)
Pt reports that she noticed her feet slightly puffy, she can see it more in her toes. No weight gain. Aware I will forward to Dr Angelena Form for review as Amlodipine was started last month. BP currently normal. Aware we will call once reviewed/advised on. Patient verbalized understanding and agreeable to plan.

## 2021-01-30 DIAGNOSIS — N182 Chronic kidney disease, stage 2 (mild): Secondary | ICD-10-CM | POA: Diagnosis not present

## 2021-01-30 DIAGNOSIS — E7800 Pure hypercholesterolemia, unspecified: Secondary | ICD-10-CM | POA: Diagnosis not present

## 2021-01-30 DIAGNOSIS — M1990 Unspecified osteoarthritis, unspecified site: Secondary | ICD-10-CM | POA: Diagnosis not present

## 2021-01-30 DIAGNOSIS — I1310 Hypertensive heart and chronic kidney disease without heart failure, with stage 1 through stage 4 chronic kidney disease, or unspecified chronic kidney disease: Secondary | ICD-10-CM | POA: Diagnosis not present

## 2021-02-11 DIAGNOSIS — R202 Paresthesia of skin: Secondary | ICD-10-CM | POA: Diagnosis not present

## 2021-02-11 DIAGNOSIS — I10 Essential (primary) hypertension: Secondary | ICD-10-CM | POA: Diagnosis not present

## 2021-03-02 DIAGNOSIS — N182 Chronic kidney disease, stage 2 (mild): Secondary | ICD-10-CM | POA: Diagnosis not present

## 2021-03-02 DIAGNOSIS — E7800 Pure hypercholesterolemia, unspecified: Secondary | ICD-10-CM | POA: Diagnosis not present

## 2021-03-02 DIAGNOSIS — I1310 Hypertensive heart and chronic kidney disease without heart failure, with stage 1 through stage 4 chronic kidney disease, or unspecified chronic kidney disease: Secondary | ICD-10-CM | POA: Diagnosis not present

## 2021-03-02 DIAGNOSIS — M1990 Unspecified osteoarthritis, unspecified site: Secondary | ICD-10-CM | POA: Diagnosis not present

## 2021-03-09 ENCOUNTER — Other Ambulatory Visit: Payer: Self-pay

## 2021-03-09 ENCOUNTER — Ambulatory Visit (HOSPITAL_COMMUNITY)
Admission: RE | Admit: 2021-03-09 | Discharge: 2021-03-09 | Disposition: A | Discharge status: Home / Self Care | Payer: PPO | Source: Ambulatory Visit | Attending: Vascular Surgery | Admitting: Vascular Surgery

## 2021-03-09 ENCOUNTER — Other Ambulatory Visit: Payer: Self-pay | Admitting: *Deleted

## 2021-03-09 DIAGNOSIS — M7989 Other specified soft tissue disorders: Secondary | ICD-10-CM | POA: Diagnosis not present

## 2021-03-10 ENCOUNTER — Encounter: Payer: PPO | Admitting: Vascular Surgery

## 2021-03-20 ENCOUNTER — Other Ambulatory Visit: Payer: Self-pay | Admitting: Student

## 2021-03-20 DIAGNOSIS — I10 Essential (primary) hypertension: Secondary | ICD-10-CM

## 2021-03-22 NOTE — Telephone Encounter (Signed)
Rx(s) sent to pharmacy electronically.  

## 2021-03-30 ENCOUNTER — Other Ambulatory Visit: Payer: Self-pay | Admitting: Student

## 2021-03-30 DIAGNOSIS — I10 Essential (primary) hypertension: Secondary | ICD-10-CM

## 2021-04-19 ENCOUNTER — Other Ambulatory Visit: Payer: Self-pay

## 2021-04-19 DIAGNOSIS — I712 Thoracic aortic aneurysm, without rupture, unspecified: Secondary | ICD-10-CM

## 2021-05-02 DIAGNOSIS — N182 Chronic kidney disease, stage 2 (mild): Secondary | ICD-10-CM | POA: Diagnosis not present

## 2021-05-02 DIAGNOSIS — E7800 Pure hypercholesterolemia, unspecified: Secondary | ICD-10-CM | POA: Diagnosis not present

## 2021-05-02 DIAGNOSIS — I1310 Hypertensive heart and chronic kidney disease without heart failure, with stage 1 through stage 4 chronic kidney disease, or unspecified chronic kidney disease: Secondary | ICD-10-CM | POA: Diagnosis not present

## 2021-05-02 DIAGNOSIS — M1990 Unspecified osteoarthritis, unspecified site: Secondary | ICD-10-CM | POA: Diagnosis not present

## 2021-05-07 DIAGNOSIS — H40013 Open angle with borderline findings, low risk, bilateral: Secondary | ICD-10-CM | POA: Diagnosis not present

## 2021-05-17 ENCOUNTER — Ambulatory Visit (HOSPITAL_COMMUNITY)
Admission: RE | Admit: 2021-05-17 | Discharge: 2021-05-17 | Disposition: A | Discharge status: Home / Self Care | Payer: PPO | Source: Ambulatory Visit | Attending: Surgery | Admitting: Surgery

## 2021-05-17 ENCOUNTER — Other Ambulatory Visit: Payer: Self-pay

## 2021-05-17 DIAGNOSIS — I712 Thoracic aortic aneurysm, without rupture, unspecified: Secondary | ICD-10-CM

## 2021-05-17 DIAGNOSIS — I716 Thoracoabdominal aortic aneurysm, without rupture: Secondary | ICD-10-CM | POA: Diagnosis not present

## 2021-05-17 DIAGNOSIS — N281 Cyst of kidney, acquired: Secondary | ICD-10-CM | POA: Diagnosis not present

## 2021-05-17 DIAGNOSIS — I714 Abdominal aortic aneurysm, without rupture: Secondary | ICD-10-CM | POA: Diagnosis not present

## 2021-05-17 DIAGNOSIS — J984 Other disorders of lung: Secondary | ICD-10-CM | POA: Diagnosis not present

## 2021-05-17 LAB — POCT I-STAT CREATININE: Creatinine, Ser: 1 mg/dL (ref 0.44–1.00)

## 2021-05-17 MED ORDER — IOHEXOL 350 MG/ML SOLN
120.0000 mL | Freq: Once | INTRAVENOUS | Status: AC | PRN
  Administered 2021-05-17: 120 mL via INTRAVENOUS

## 2021-05-24 ENCOUNTER — Ambulatory Visit: Payer: PPO | Admitting: Surgery

## 2021-05-31 ENCOUNTER — Other Ambulatory Visit: Payer: Self-pay

## 2021-05-31 ENCOUNTER — Ambulatory Visit: Payer: PPO | Admitting: Physician Assistant

## 2021-05-31 VITALS — BP 152/77 | HR 65 | Temp 97.7°F | Resp 20 | Ht 62.0 in | Wt 145.6 lb

## 2021-05-31 DIAGNOSIS — I712 Thoracic aortic aneurysm, without rupture, unspecified: Secondary | ICD-10-CM

## 2021-05-31 DIAGNOSIS — M7989 Other specified soft tissue disorders: Secondary | ICD-10-CM

## 2021-05-31 DIAGNOSIS — R209 Unspecified disturbances of skin sensation: Secondary | ICD-10-CM | POA: Insufficient documentation

## 2021-05-31 NOTE — Progress Notes (Signed)
Office Note     CC:  follow up Requesting Provider:  Tisovec, Fransico Him, MD  HPI: Nancy Montoya is a 75 y.o. ([birth year]) female who presents for surveillance of endovascular repair of thoracoabdominal aneurysm in 2018 by Dr. Trula Slade.  Patient has no concerns.  She states she is moving all extremities well without any focal weakness or numbness.  She denies any new or changing chest, abdominal, or back pain.  She also denies any claudication or nonhealing wounds of bilateral lower extremities.  She is a former smoker.  She is on aspirin and statin daily.  She was also seen before for evaluation of venous reflux of right lower extremity.  She has no history of DVT.  She does still notice edema of right lower extremity that comes and goes however is not bothered by it.  She does not wear compression however she does make an attempt to elevate her legs during the day.  Past Medical History:  Diagnosis Date   Anemia    Arthritis    Blood transfusion without reported diagnosis 2009   anemia   Constipation    Hypertension    Pneumonia    hx recent   Thoracic aortic aneurysm without rupture [facility])     Past Surgical History:  Procedure Laterality Date   BENTALL PROCEDURE N/A 10/20/2016   Procedure: BIOLOGIC BENTALL PROCEDURE -21 mm Medtronic Freestyle Porcine Valve Conduit;  Surgeon: Gaye Pollack, MD;  Location: [place];  Service: Open Heart Surgery;  Laterality: N/A;  RIGHT AXILLARY ARTERY CANNULATION  BILATERAL RADIAL ARTERIAL LINES   CARDIAC CATHETERIZATION N/A 10/11/2016   Procedure: Right/Left Heart Cath and Coronary Angiography;  Surgeon: Burnell Blanks, MD;  Location: [place] [facility];  Service: Cardiovascular;  Laterality: N/A;   CAROTID-SUBCLAVIAN BYPASS GRAFT Left 08/17/2017   Procedure: BYPASS HEMASHIELD GOLD GRAFT 54m x 30cm CAROTID-SUBCLAVIAN;  Surgeon: BSerafina Mitchell MD;  [place]  Service: Vascular;  Laterality: Left;   COLONOSCOPY     EMBOLIZATION   03/08/2019    RADIOLOGY WITH ANESTHESIA EMBOLIZATION    IR AORTA/THORACIC  03/08/2019   IR CT SPINE LTD  03/08/2019   IR EMBO VENOUS NOT HEMORR HEMANG  INC GUIDE ROADMAPPING  03/08/2019   IR RADIOLOGIST EVAL & MGMT  11/08/2018   IR RADIOLOGIST EVAL & MGMT  10/30/2019   RADIOLOGY WITH ANESTHESIA N/A 03/08/2019   Procedure: RADIOLOGY WITH ANESTHESIA EMBOLIZATION;  Surgeon: MJacqulynn Cadet MD;  [place]  Service: Radiology;  Laterality: N/A;   REPLACEMENT ASCENDING AORTA N/A 10/20/2016   Procedure: REPLACEMENT ASCENDING AORTA WITH ARCH RECONSTRUCTION-Aorto-Inominate bypass, Aorto-Carotid Bypass.. With Hypothermic circulatory arrest;  Surgeon: BGaye Pollack MD;  [place]  Service: Open Heart Surgery;  Laterality: N/A;   TEE WITHOUT CARDIOVERSION N/A 10/20/2016   Procedure: TRANSESOPHAGEAL ECHOCARDIOGRAM (TEE);  Surgeon: BGaye Pollack MD;  [place]  Service: Open Heart Surgery;  Laterality: N/A;   THORACIC AORTIC ENDOVASCULAR STENT GRAFT N/A 08/17/2017   Procedure: THORACIC AORTIC ENDOVASCULAR STENT GRAFT;  Surgeon: BSerafina Mitchell MD;  [facility];  Service: Vascular;  Laterality: N/A;   TUBAL LIGATION  1976    Social History   Socioeconomic History   Marital status: Married    Spouse name: Not on file   Number of children: 1   Years of education: Not on file   Highest education level: Not on file  Occupational History   Occupation: Semi Retired-housework  Tobacco Use  Smoking status: Former    Packs/day: 0.50    Years: 40.00    Pack years: 20.00    Types: Cigarettes    Start date: 10/06/1968    Quit date: 06/22/2008    Years since quitting: 12.9   Smokeless tobacco: Never  Vaping Use   Vaping Use: Never used  Substance and Sexual Activity   Alcohol use: No   Drug use: No   Sexual activity: Not on file  Other Topics Concern   Not on file  Social History Narrative   Not on file   Social Determinants of Health   Financial Resource Strain: Not on file   Food Insecurity: Not on file  Transportation Needs: Not on file  Physical Activity: Not on file  Stress: Not on file  Social Connections: Not on file  Intimate Partner Violence: Not on file    Family History  Problem Relation Age of Onset   Colon cancer Neg Hx     Current Outpatient Medications  Medication Sig Dispense Refill   amLODipine (NORVASC) 5 MG tablet Take 1 tablet (5 mg total) by mouth daily. 90 tablet 2   aspirin EC 81 MG tablet Take 81 mg by mouth daily.     atorvastatin (LIPITOR) 20 MG tablet Take 1 tablet (20 mg total) by mouth daily. 90 tablet 3   carvedilol (COREG) 12.5 MG tablet TAKE 1 TABLET BY MOUTH TWICE A DAY 180 tablet 2   cholecalciferol (VITAMIN D3) 25 MCG (1000 UT) tablet Take 1,000 Units by mouth daily.     olmesartan (BENICAR) 40 MG tablet Take 1 tablet (40 mg total) by mouth daily. 90 tablet 3   No current facility-administered medications for this visit.    Allergies  Allergen Reactions   Codeine Other (See Comments)    hallucinations   Latex Rash   Sulfa Antibiotics Rash     REVIEW OF SYSTEMS:   '[X]'$  denotes positive finding, '[ ]'$  denotes negative finding Cardiac  Comments:  Chest pain or chest pressure:    Shortness of breath upon exertion:    Short of breath when lying flat:    Irregular heart rhythm:        Vascular    Pain in calf, thigh, or hip brought on by ambulation:    Pain in feet at night that wakes you up from your sleep:     Blood clot in your veins:    Leg swelling:         Pulmonary    Oxygen at home:    Productive cough:     Wheezing:         Neurologic    Sudden weakness in arms or legs:     Sudden numbness in arms or legs:     Sudden onset of difficulty speaking or slurred speech:    Temporary loss of vision in one eye:     Problems with dizziness:         Gastrointestinal    Blood in stool:     Vomited blood:         Genitourinary    Burning when urinating:     Blood in urine:        Psychiatric     Major depression:         Hematologic    Bleeding problems:    Problems with blood clotting too easily:        Skin    Rashes or ulcers:  Constitutional    Fever or chills:      PHYSICAL EXAMINATION:  Vitals:   05/31/21 0929  BP: (!) 152/77  Pulse: 65  Resp: 20  Temp: 97.7 F (36.5 C)  TempSrc: Temporal  SpO2: 97%  Weight: 145 lb 9.6 oz (66 kg)  Height: '5\' 2"'$  (1.575 m)    General:  WDWN in NAD; vital signs documented above Gait: Not observed HENT: WNL, normocephalic Pulmonary: normal non-labored breathing , without Rales, rhonchi,  wheezing Cardiac: regular HR Abdomen: soft, NT, no masses Skin: without rashes Vascular Exam/Pulses:  Right Left  Radial 2+ (normal) 2+ (normal)  DP 1+ (weak) 2+ (normal)   Extremities: without ischemic changes, without Gangrene , without cellulitis; without open wounds;  Musculoskeletal: no muscle wasting or atrophy  Neurologic: A&O X 3;  No focal weakness or paresthesias are detected Psychiatric:  The pt has Normal affect.   Non-Invasive Vascular Imaging:   CTA chest, abd, pelvis  IMPRESSION: Overall stable postsurgical appearance of the extensive thoracoabdominal aortic aneurysm repair without complicating feature or endoleak. The aortic stent graft terminates in the supra celiac abdominal aorta, with stable size of upper abdominal aortic aneurysm measuring up to 4.7 cm in AP dimension, similar to previous exam.    ASSESSMENT/PLAN:: 75 y.o. female here for surveillance of thoracoabdominal aneurysm repair  -Subjectively patient is moving all extremities well and without any concerns.  She has no signs or symptoms of malperfusion -CTA shows stable thoracoabdominal aortic aneurysm repair without endoleak.  There is also a stable size of the upper abdominal aortic aneurysm measuring 4.7 cm -Continue aspirin and statin daily -Plan will be to repeat CTA chest abdomen pelvis in 1 year to follow-up with Dr. Trula Slade  -We  also discussed ongoing right lower extremity edema.  Study was negative for DVT during last office visit.  She is not bothered by this edema.  Recommendations would include knee-high 15 to 20 mmHg compression stockings to be worn daily however she is not interested in wearing compression currently.  Dagoberto Ligas, PA-C [facility] [phone]  Clinic MD:   Trula Slade

## 2021-06-30 DIAGNOSIS — Z23 Encounter for immunization: Secondary | ICD-10-CM | POA: Diagnosis not present

## 2021-07-06 ENCOUNTER — Ambulatory Visit (HOSPITAL_COMMUNITY): Payer: PPO | Attending: Cardiology

## 2021-07-06 ENCOUNTER — Other Ambulatory Visit: Payer: Self-pay

## 2021-07-06 DIAGNOSIS — I712 Thoracic aortic aneurysm, without rupture, unspecified: Secondary | ICD-10-CM | POA: Diagnosis not present

## 2021-07-06 DIAGNOSIS — Z954 Presence of other heart-valve replacement: Secondary | ICD-10-CM

## 2021-07-06 DIAGNOSIS — I10 Essential (primary) hypertension: Secondary | ICD-10-CM

## 2021-07-06 DIAGNOSIS — I359 Nonrheumatic aortic valve disorder, unspecified: Secondary | ICD-10-CM | POA: Diagnosis not present

## 2021-07-08 LAB — ECHOCARDIOGRAM COMPLETE
AR max vel: 0.98 cm2
AV Area VTI: 1.05 cm2
AV Area mean vel: 1.05 cm2
AV Mean grad: 7.7 mmHg
AV Peak grad: 16.6 mmHg
Ao pk vel: 2.04 m/s
Area-P 1/2: 2.31 cm2
S' Lateral: 2.6 cm

## 2021-07-26 ENCOUNTER — Other Ambulatory Visit: Payer: Self-pay | Admitting: Internal Medicine

## 2021-07-26 DIAGNOSIS — Z1231 Encounter for screening mammogram for malignant neoplasm of breast: Secondary | ICD-10-CM

## 2021-08-02 DIAGNOSIS — I1310 Hypertensive heart and chronic kidney disease without heart failure, with stage 1 through stage 4 chronic kidney disease, or unspecified chronic kidney disease: Secondary | ICD-10-CM | POA: Diagnosis not present

## 2021-08-02 DIAGNOSIS — M1990 Unspecified osteoarthritis, unspecified site: Secondary | ICD-10-CM | POA: Diagnosis not present

## 2021-08-02 DIAGNOSIS — E7800 Pure hypercholesterolemia, unspecified: Secondary | ICD-10-CM | POA: Diagnosis not present

## 2021-08-02 DIAGNOSIS — N182 Chronic kidney disease, stage 2 (mild): Secondary | ICD-10-CM | POA: Diagnosis not present

## 2021-08-31 ENCOUNTER — Other Ambulatory Visit: Payer: Self-pay

## 2021-08-31 ENCOUNTER — Ambulatory Visit
Admission: RE | Admit: 2021-08-31 | Discharge: 2021-08-31 | Disposition: A | Discharge status: Home / Self Care | Payer: PPO | Source: Ambulatory Visit | Attending: Internal Medicine | Admitting: Internal Medicine

## 2021-08-31 DIAGNOSIS — Z1231 Encounter for screening mammogram for malignant neoplasm of breast: Secondary | ICD-10-CM | POA: Diagnosis not present

## 2021-09-01 DIAGNOSIS — I1310 Hypertensive heart and chronic kidney disease without heart failure, with stage 1 through stage 4 chronic kidney disease, or unspecified chronic kidney disease: Secondary | ICD-10-CM | POA: Diagnosis not present

## 2021-09-01 DIAGNOSIS — N182 Chronic kidney disease, stage 2 (mild): Secondary | ICD-10-CM | POA: Diagnosis not present

## 2021-09-01 DIAGNOSIS — E7800 Pure hypercholesterolemia, unspecified: Secondary | ICD-10-CM | POA: Diagnosis not present

## 2021-09-01 DIAGNOSIS — M1990 Unspecified osteoarthritis, unspecified site: Secondary | ICD-10-CM | POA: Diagnosis not present

## 2021-09-03 DIAGNOSIS — M8589 Other specified disorders of bone density and structure, multiple sites: Secondary | ICD-10-CM | POA: Diagnosis not present

## 2021-09-03 DIAGNOSIS — E7800 Pure hypercholesterolemia, unspecified: Secondary | ICD-10-CM | POA: Diagnosis not present

## 2021-09-03 DIAGNOSIS — E559 Vitamin D deficiency, unspecified: Secondary | ICD-10-CM | POA: Diagnosis not present

## 2021-09-10 DIAGNOSIS — Z1339 Encounter for screening examination for other mental health and behavioral disorders: Secondary | ICD-10-CM | POA: Diagnosis not present

## 2021-09-10 DIAGNOSIS — M1990 Unspecified osteoarthritis, unspecified site: Secondary | ICD-10-CM | POA: Diagnosis not present

## 2021-09-10 DIAGNOSIS — Z0000 Encounter for general adult medical examination without abnormal findings: Secondary | ICD-10-CM | POA: Diagnosis not present

## 2021-09-10 DIAGNOSIS — Z1212 Encounter for screening for malignant neoplasm of rectum: Secondary | ICD-10-CM | POA: Diagnosis not present

## 2021-09-10 DIAGNOSIS — N182 Chronic kidney disease, stage 2 (mild): Secondary | ICD-10-CM | POA: Diagnosis not present

## 2021-09-10 DIAGNOSIS — I712 Thoracic aortic aneurysm, without rupture, unspecified: Secondary | ICD-10-CM | POA: Diagnosis not present

## 2021-09-10 DIAGNOSIS — I700 Atherosclerosis of aorta: Secondary | ICD-10-CM | POA: Diagnosis not present

## 2021-09-10 DIAGNOSIS — R82998 Other abnormal findings in urine: Secondary | ICD-10-CM | POA: Diagnosis not present

## 2021-09-10 DIAGNOSIS — Z23 Encounter for immunization: Secondary | ICD-10-CM | POA: Diagnosis not present

## 2021-09-10 DIAGNOSIS — Z95828 Presence of other vascular implants and grafts: Secondary | ICD-10-CM | POA: Diagnosis not present

## 2021-09-10 DIAGNOSIS — E7800 Pure hypercholesterolemia, unspecified: Secondary | ICD-10-CM | POA: Diagnosis not present

## 2021-09-10 DIAGNOSIS — I6529 Occlusion and stenosis of unspecified carotid artery: Secondary | ICD-10-CM | POA: Diagnosis not present

## 2021-09-10 DIAGNOSIS — Z952 Presence of prosthetic heart valve: Secondary | ICD-10-CM | POA: Diagnosis not present

## 2021-09-10 DIAGNOSIS — D692 Other nonthrombocytopenic purpura: Secondary | ICD-10-CM | POA: Diagnosis not present

## 2021-09-10 DIAGNOSIS — E559 Vitamin D deficiency, unspecified: Secondary | ICD-10-CM | POA: Diagnosis not present

## 2021-09-10 DIAGNOSIS — Z1331 Encounter for screening for depression: Secondary | ICD-10-CM | POA: Diagnosis not present

## 2021-09-10 DIAGNOSIS — I1310 Hypertensive heart and chronic kidney disease without heart failure, with stage 1 through stage 4 chronic kidney disease, or unspecified chronic kidney disease: Secondary | ICD-10-CM | POA: Diagnosis not present

## 2021-12-24 DIAGNOSIS — H40013 Open angle with borderline findings, low risk, bilateral: Secondary | ICD-10-CM | POA: Diagnosis not present

## 2021-12-30 ENCOUNTER — Other Ambulatory Visit: Payer: Self-pay | Admitting: Cardiovascular Disease

## 2021-12-30 DIAGNOSIS — I10 Essential (primary) hypertension: Secondary | ICD-10-CM

## 2022-01-14 ENCOUNTER — Other Ambulatory Visit: Payer: Self-pay | Admitting: Cardiovascular Disease

## 2022-02-11 ENCOUNTER — Other Ambulatory Visit: Payer: Self-pay | Admitting: Cardiovascular Disease

## 2022-02-11 DIAGNOSIS — I10 Essential (primary) hypertension: Secondary | ICD-10-CM

## 2022-02-24 ENCOUNTER — Other Ambulatory Visit: Payer: Self-pay | Admitting: Cardiovascular Disease

## 2022-02-24 DIAGNOSIS — I10 Essential (primary) hypertension: Secondary | ICD-10-CM

## 2022-02-25 ENCOUNTER — Other Ambulatory Visit: Payer: Self-pay

## 2022-02-25 DIAGNOSIS — I10 Essential (primary) hypertension: Secondary | ICD-10-CM

## 2022-02-25 MED ORDER — AMLODIPINE BESYLATE 5 MG PO TABS: 5.0000 mg | ORAL_TABLET | Freq: Every day | ORAL | 0 refills | Status: DC

## 2022-02-25 NOTE — Telephone Encounter (Signed)
All of pt's medications that were requested, was sent to pt's pharmacy already. Confirmation received.

## 2022-04-14 ENCOUNTER — Other Ambulatory Visit: Payer: Self-pay | Admitting: Cardiovascular Disease

## 2022-04-25 ENCOUNTER — Ambulatory Visit: Payer: PPO | Admitting: Cardiovascular Disease

## 2022-04-25 ENCOUNTER — Encounter: Payer: Self-pay | Admitting: Cardiovascular Disease

## 2022-04-25 VITALS — BP 136/60 | HR 70 | Ht 62.0 in | Wt 149.4 lb

## 2022-04-25 DIAGNOSIS — I359 Nonrheumatic aortic valve disorder, unspecified: Secondary | ICD-10-CM

## 2022-04-25 DIAGNOSIS — I712 Thoracic aortic aneurysm, without rupture, unspecified: Secondary | ICD-10-CM | POA: Diagnosis not present

## 2022-04-25 DIAGNOSIS — I10 Essential (primary) hypertension: Secondary | ICD-10-CM | POA: Diagnosis not present

## 2022-04-25 MED ORDER — CARVEDILOL 12.5 MG PO TABS: 12.5000 mg | ORAL_TABLET | Freq: Two times a day (BID) | ORAL | 3 refills | Status: DC

## 2022-04-25 MED ORDER — AMLODIPINE BESYLATE 5 MG PO TABS: 5.0000 mg | ORAL_TABLET | Freq: Every day | ORAL | 3 refills | Status: DC

## 2022-04-25 MED ORDER — ATORVASTATIN CALCIUM 20 MG PO TABS: 20.0000 mg | ORAL_TABLET | Freq: Every day | ORAL | 3 refills | Status: DC

## 2022-04-25 MED ORDER — OLMESARTAN MEDOXOMIL 40 MG PO TABS: 40.0000 mg | ORAL_TABLET | Freq: Every day | ORAL | 3 refills | Status: DC

## 2022-04-25 NOTE — Progress Notes (Signed)
Chief Complaint  Patient presents with   Follow-up    Thoracic aortic aneurysm    History of Present Illness: 76 yo female with history of HTN and thoracic aortic aneurysm s/p repair who is here today for cardiac follow up. I met her in January 2018 to discuss a cardiac cath which was required as part of her pre-operative workup before planned replacement of her aorta. Cardiac cath 10/11/16 with no evidence of CAD. She underwent replacement of the aortic arch with aorto-innominate and aorto-left carotid bypass with Bentall procedure using Medtronic 21 mm Freestyle porcine root on 10/20/16. She then underwent left carotid to left subclavian bypass with endovascular repair of the descending thoracic aortic aneurysm in November 2018. She has done well since then. Endoleak repair of thoracic aortic graft June 2020 per interventional radiology. She is followed in [facility] by Dr. Trula Slade. Echo October 2022 with LVEF=60-65%. The bioprosthetic AVR was working well. Mild MR.   She is here today for follow up. The patient denies any chest pain, dyspnea, palpitations, lower extremity edema, orthopnea, PND, dizziness, near syncope or syncope.   Primary Care Physician: Haywood Pao, MD  Past Medical History:  Diagnosis Date   Anemia    Arthritis    Blood transfusion without reported diagnosis 2009   anemia   Constipation    Hypertension    Pneumonia    hx recent   Thoracic aortic aneurysm without rupture [facility])     Past Surgical History:  Procedure Laterality Date   BENTALL PROCEDURE N/A 10/20/2016   Procedure: BIOLOGIC BENTALL PROCEDURE -21 mm Medtronic Freestyle Porcine Valve Conduit;  Surgeon: Gaye Pollack, MD;  Location: [place];  Service: Open Heart Surgery;  Laterality: N/A;  RIGHT AXILLARY ARTERY CANNULATION  BILATERAL RADIAL ARTERIAL LINES   CARDIAC CATHETERIZATION N/A 10/11/2016   Procedure: Right/Left Heart Cath and Coronary Angiography;  Surgeon: Burnell Blanks, MD;  Location: [place] [facility];  Service: Cardiovascular;  Laterality: N/A;   CAROTID-SUBCLAVIAN BYPASS GRAFT Left 08/17/2017   Procedure: BYPASS HEMASHIELD GOLD GRAFT 41m x 30cm CAROTID-SUBCLAVIAN;  Surgeon: BSerafina Mitchell MD;  [place] [place]  Service: Vascular;  Laterality: Left;   COLONOSCOPY     EMBOLIZATION  03/08/2019    RADIOLOGY WITH ANESTHESIA EMBOLIZATION    IR AORTA/THORACIC  03/08/2019   IR CT SPINE LTD  03/08/2019   IR EMBO VENOUS NOT HEMORR HEMANG  INC GUIDE ROADMAPPING  03/08/2019   IR RADIOLOGIST EVAL & MGMT  11/08/2018   IR RADIOLOGIST EVAL & MGMT  10/30/2019   RADIOLOGY WITH ANESTHESIA N/A 03/08/2019   Procedure: RADIOLOGY WITH ANESTHESIA EMBOLIZATION;  Surgeon: MJacqulynn Cadet MD;  [place]  Service: Radiology;  Laterality: N/A;   REPLACEMENT ASCENDING AORTA N/A 10/20/2016   Procedure: REPLACEMENT ASCENDING AORTA WITH ARCH RECONSTRUCTION-Aorto-Inominate bypass, Aorto-Carotid Bypass.. With Hypothermic circulatory arrest;  Surgeon: BGaye Pollack MD;  [place]  Service: Open Heart Surgery;  Laterality: N/A;   TEE WITHOUT CARDIOVERSION N/A 10/20/2016   Procedure: TRANSESOPHAGEAL ECHOCARDIOGRAM (TEE);  Surgeon: BGaye Pollack MD;  [place]  Service: Open Heart Surgery;  Laterality: N/A;   THORACIC AORTIC ENDOVASCULAR STENT GRAFT N/A 08/17/2017   Procedure: THORACIC AORTIC ENDOVASCULAR STENT GRAFT;  Surgeon: BSerafina Mitchell MD;  [facility];  Service: Vascular;  Laterality: N/A;   TUBAL LIGATION  1976    Current Outpatient Medications  Medication Sig Dispense Refill   aspirin EC 81 MG tablet Take 81 mg by  mouth daily.     amLODipine (NORVASC) 5 MG tablet Take 1 tablet (5 mg total) by mouth daily. 90 tablet 3   atorvastatin (LIPITOR) 20 MG tablet Take 1 tablet (20 mg total) by mouth daily. 90 tablet 3   carvedilol (COREG) 12.5 MG tablet Take 1 tablet (12.5 mg total) by mouth 2 (two) times daily. 180 tablet 3   olmesartan (BENICAR) 40 MG tablet Take 1 tablet (40 mg  total) by mouth daily. 90 tablet 3   No current facility-administered medications for this visit.    Allergies  Allergen Reactions   Codeine Other (See Comments)    hallucinations   Latex Rash   Sulfa Antibiotics Rash    Social History   Socioeconomic History   Marital status: Married    Spouse name: Not on file   Number of children: 1   Years of education: Not on file   Highest education level: Not on file  Occupational History   Occupation: Semi Retired-housework  Tobacco Use   Smoking status: Former    Packs/day: 0.50    Years: 40.00    Total pack years: 20.00    Types: Cigarettes    Start date: 10/06/1968    Quit date: 06/22/2008    Years since quitting: 13.8   Smokeless tobacco: Never  Vaping Use   Vaping Use: Never used  Substance and Sexual Activity   Alcohol use: No   Drug use: No   Sexual activity: Not on file  Other Topics Concern   Not on file  Social History Narrative   Not on file   Social Determinants of Health   Financial Resource Strain: Not on file  Food Insecurity: Not on file  Transportation Needs: Not on file  Physical Activity: Not on file  Stress: Not on file  Social Connections: Not on file  Intimate Partner Violence: Not on file    Family History  Problem Relation Age of Onset   Colon cancer Neg Hx     Review of Systems:  As stated in the HPI and otherwise negative.   BP 136/60   Pulse 70   Ht $R'5\' 2"'Oc$  (1.575 m)   Wt 149 lb 6.4 oz (67.8 kg)   SpO2 95%   BMI 27.33 kg/m   Physical Examination: General: Well developed, well nourished, NAD  HEENT: OP clear, mucus membranes moist  SKIN: warm, dry. No rashes. Neuro: No focal deficits  Musculoskeletal: Muscle strength 5/5 all ext  Psychiatric: Mood and affect normal  Neck: No JVD, no carotid bruits, no thyromegaly, no lymphadenopathy.  Lungs:Clear bilaterally, no wheezes, rhonci, crackles Cardiovascular: Regular rate and rhythm. Soft systolic murmur.  Abdomen:Soft. Bowel  sounds present. Non-tender.  Extremities: No lower extremity edema. Pulses are 2 + in the bilateral DP/PT.  Echo October 2022:    1. Left ventricular ejection fraction, by estimation, is 60 to 65%. The  left ventricle has normal function. The left ventricle has no regional  wall motion abnormalities. Left ventricular diastolic parameters are  indeterminate.   2. Right ventricular systolic function is normal. The right ventricular  size is normal.   3. Left atrial size was mildly dilated.   4. The mitral valve is normal in structure. Mild mitral valve  regurgitation. No evidence of mitral stenosis.   5. Well seated 21 mm medtronic freestyle bioprosthetic aortic valve.  Aortic valve regurgitation is trivial. No aortic stenosis is present.   6. The inferior vena cava is normal in size with greater  than 50%  respiratory variability, suggesting right atrial pressure of 3 mmHg.   7. Status post Bentall procedure. There is flow noted from the main  pulmonary artery to the aortic root which was seen on previous study in  2019   EKG:  EKG is ordered today. The ekg ordered today demonstrates Sinus, Non-specific ST and T wave abnormality. Low voltage  Recent Labs: 05/17/2021: Creatinine, Ser 1.00   Lipid Panel No results found for: "CHOL", "TRIG", "HDL", "CHOLHDL", "VLDL", "LDLCALC", "LDLDIRECT"   Wt Readings from Last 3 Encounters:  04/25/22 149 lb 6.4 oz (67.8 kg)  05/31/21 145 lb 9.6 oz (66 kg)  12/08/20 145 lb (65.8 kg)     Other studies Reviewed: Additional studies/ records that were reviewed today include: . Review of the above records demonstrates:    Assessment and Plan:   1. Thoracic aortic aneurysm She is s/p aortic arch replacement with Bentall procedure and porcine aortic valve replacement in January 2018. Repair of thoracic aortic endograft leak in June 2020. She is doing well. Will continue ASA and beta blocker. SBE prophylaxis as needed.   2. HTN: BP has been well  controlled at home. BP is controlled here today. She has a history of white coat HTN. Continue current medications.   3. Aortic valve disease: She is s/p AVR with a bioprosthetic AVR (Bentall procedure). Echo October 2022 with normally functioning AVR. Repeat echo in October 2025.   Current medicines are reviewed at length with the patient today.  The patient does not have concerns regarding medicines.  The following changes have been made:  no change  Labs/ tests ordered today include:   Orders Placed This Encounter  Procedures   EKG 12-Lead   Disposition:   Follow up with me in one year.   Signed, Lauree Chandler, MD 04/25/2022 3:11 PM    [facility] [place], [place], [place]  [address] Phone: [phone]; Fax: [phone]

## 2022-04-25 NOTE — Patient Instructions (Signed)
Medication Instructions:  No changes *If you need a refill on your cardiac medications before your next appointment, please call your pharmacy*   Lab Work: none If you have labs (blood work) drawn today and your tests are completely normal, you will receive your results only by: [place] (if you have MyChart) OR A paper copy in the mail If you have any lab test that is abnormal or we need to change your treatment, we will call you to review the results.   Testing/Procedures: none   Follow-Up: At [facility] you and your health needs are our priority.  As part of our continuing mission to provide you with exceptional heart care, we have created designated Provider Care Teams.  These Care Teams include your primary Cardiologist (physician) and Advanced Practice Providers (APPs -  Physician Assistants and Nurse Practitioners) who all work together to provide you with the care you need, when you need it.  We recommend signing up for the patient portal called "MyChart".  Sign up information is provided on this After Visit Summary.  MyChart is used to connect with patients for Virtual Visits (Telemedicine).  Patients are able to view lab/test results, encounter notes, upcoming appointments, etc.  Non-urgent messages can be sent to your provider as well.   To learn more about what you can do with MyChart, go to [email or web address].    Your next appointment:   12 month(s)  The format for your next appointment:   In Person  Provider:   Lauree Chandler, MD     Important Information About Sugar

## 2022-05-18 ENCOUNTER — Other Ambulatory Visit: Payer: Self-pay

## 2022-05-18 DIAGNOSIS — I712 Thoracic aortic aneurysm, without rupture, unspecified: Secondary | ICD-10-CM

## 2022-06-03 ENCOUNTER — Ambulatory Visit (HOSPITAL_COMMUNITY)
Admission: RE | Admit: 2022-06-03 | Discharge: 2022-06-03 | Disposition: A | Discharge status: Home / Self Care | Payer: PPO | Source: Ambulatory Visit | Attending: Surgery | Admitting: Surgery

## 2022-06-03 DIAGNOSIS — I712 Thoracic aortic aneurysm, without rupture, unspecified: Secondary | ICD-10-CM | POA: Diagnosis not present

## 2022-06-03 DIAGNOSIS — I716 Thoracoabdominal aortic aneurysm, without rupture, unspecified: Secondary | ICD-10-CM | POA: Diagnosis not present

## 2022-06-03 DIAGNOSIS — I728 Aneurysm of other specified arteries: Secondary | ICD-10-CM | POA: Diagnosis not present

## 2022-06-03 DIAGNOSIS — I714 Abdominal aortic aneurysm, without rupture, unspecified: Secondary | ICD-10-CM | POA: Diagnosis not present

## 2022-06-03 MED ORDER — IOHEXOL 350 MG/ML SOLN
100.0000 mL | Freq: Once | INTRAVENOUS | Status: AC | PRN
  Administered 2022-06-03: 100 mL via INTRAVENOUS

## 2022-06-13 ENCOUNTER — Ambulatory Visit: Payer: PPO | Admitting: Surgery

## 2022-06-14 DIAGNOSIS — H40013 Open angle with borderline findings, low risk, bilateral: Secondary | ICD-10-CM | POA: Diagnosis not present

## 2022-06-27 ENCOUNTER — Encounter: Payer: Self-pay | Admitting: Surgery

## 2022-06-27 ENCOUNTER — Ambulatory Visit: Payer: PPO | Admitting: Surgery

## 2022-06-27 VITALS — BP 187/81 | HR 67 | Temp 98.2°F | Resp 20 | Ht 62.0 in | Wt 149.0 lb

## 2022-06-27 DIAGNOSIS — I712 Thoracic aortic aneurysm, without rupture, unspecified: Secondary | ICD-10-CM

## 2022-06-27 NOTE — Progress Notes (Unsigned)
Vascular and Vein Specialist of [place]  Patient name: Nancy Montoya MRN: [id number] DOB: [birth year] Sex: female   REASON FOR VISIT:    Follow up  [place]:   Nancy Montoya is a 76 y.o. female who returns today for follow up..  She is status post left carotid to left subclavian bypass graft with a 7 mm dacryon graft as well as endovascular repair of a 5.1 descending thoracic aortic aneurysm on 09-18-2017.  Her postoperative course was uncomplicated.  She has a history of a a type II endoleak.  In January 2020 she had an increase in her aortic diameter to 5.9 cm.  She was referred to interventional radiology.  Attempted endoleak repair was performed on 03/09/2019 via a direct sac puncture.  No arterialized channel was identified and therefore no embolization was performed.  It was felt that the endoleak had spontaneously resolved.  Her thoracic aneurysm has remained stable.   The patient has a history of hypertension which has been difficult to control.  She is a former smoker.  She is on a statin for hypercholesterolemia.  She has a history of vertigo.  This did flareup after her operation, and she continues to struggle with it   PAST MEDICAL HISTORY:   Past Medical History:  Diagnosis Date   Anemia    Arthritis    Blood transfusion without reported diagnosis 2009   anemia   Constipation    Hypertension    Pneumonia    hx recent   Thoracic aortic aneurysm without rupture ([place])      FAMILY HISTORY:   Family History  Problem Relation Age of Onset   Colon cancer Neg Hx     SOCIAL HISTORY:   Social History   Tobacco Use   Smoking status: Former    Packs/day: 0.50    Years: 40.00    Total pack years: 20.00    Types: Cigarettes    Start date: 10/06/1968    Quit date: 06/22/2008    Years since quitting: 14.0   Smokeless tobacco: Never  Substance Use Topics   Alcohol use: No     ALLERGIES:   Allergies   Allergen Reactions   Codeine Other (See Comments)    hallucinations   Latex Rash   Sulfa Antibiotics Rash     CURRENT MEDICATIONS:   Current Outpatient Medications  Medication Sig Dispense Refill   amLODipine (NORVASC) 5 MG tablet Take 1 tablet (5 mg total) by mouth daily. 90 tablet 3   aspirin EC 81 MG tablet Take 81 mg by mouth daily.     atorvastatin (LIPITOR) 20 MG tablet Take 1 tablet (20 mg total) by mouth daily. 90 tablet 3   carvedilol (COREG) 12.5 MG tablet Take 1 tablet (12.5 mg total) by mouth 2 (two) times daily. 180 tablet 3   olmesartan (BENICAR) 40 MG tablet Take 1 tablet (40 mg total) by mouth daily. 90 tablet 3   No current facility-administered medications for this visit.    REVIEW OF SYSTEMS:   '[X]'$  denotes positive finding, '[ ]'$  denotes negative finding Cardiac  Comments:  Chest pain or chest pressure:    Shortness of breath upon exertion:    Short of breath when lying flat:    Irregular heart rhythm:        Vascular    Pain in calf, thigh, or hip brought on by ambulation:    Pain in feet at night that wakes you up from your  sleep:     Blood clot in your veins:    Leg swelling:         Pulmonary    Oxygen at home:    Productive cough:     Wheezing:         Neurologic    Sudden weakness in arms or legs:     Sudden numbness in arms or legs:     Sudden onset of difficulty speaking or slurred speech:    Temporary loss of vision in one eye:     Problems with dizziness:         Gastrointestinal    Blood in stool:     Vomited blood:         Genitourinary    Burning when urinating:     Blood in urine:        Psychiatric    Major depression:         Hematologic    Bleeding problems:    Problems with blood clotting too easily:        Skin    Rashes or ulcers:        Constitutional    Fever or chills:      PHYSICAL EXAM:   There were no vitals filed for this visit.  GENERAL: The patient is a well-nourished female, in no acute  distress. The vital signs are documented above. CARDIAC: There is a regular rate and rhythm.  VASCULAR: palpable pedal pulses PULMONARY: Non-labored respirations ABDOMEN: Soft and non-tender MUSCULOSKELETAL: There are no major deformities or cyanosis. NEUROLOGIC: No focal weakness or paresthesias are detected. SKIN: There are no ulcers or rashes noted. PSYCHIATRIC: The patient has a normal affect.  STUDIES:   I have reviewed the following CTA: 1. Stable surgical repair of ascending, transverse and descending thoracic aortic aneurysm with open repair of the ascending thoracic aorta, reimplantation of the brachiocephalic and left common carotid artery and a widely patent left carotid subclavian bypass. Well-positioned thoracic endograft extending from the neoarch into the distal descending thoracic aorta. Unchanged appearance of the distal landing zone. No evidence of type 2 endoleak or other complication. 2. Stable aneurysmal dilation in the proximal descending thoracic aorta at 5.2 cm. 3. Stable aneurysmal dilation of the proximal abdominal aorta measuring 5.5 cm at the origin of the celiac artery. Patient is already under the care of a vascular surgeon and is being followed regularly. This recommendation follows ACR consensus guidelines: White Paper of the ACR Incidental Findings Committee II on Vascular Findings. J Am Coll Radiol 2013; 10:789-794. 4. Additional ancillary findings as above without interval change.  MEDICAL ISSUES:   TAAA: Her thoracic aneurysm remains unchanged with no evidence of endoleak.  However, she has had progressive degeneration of her visceral aorta with maximum diameter of 5.5 cm.  This was discussed with the patient.  Because of the location of the aneurysm, I would like to refer her to Dr. Sammuel Hines at [facility] for consideration of endovascular repair.  She is agreeable to this.    [name], MD, FACS [facility] and Vein Specialists of [facility]  [phone] Pager [phone]

## 2022-07-28 DIAGNOSIS — I728 Aneurysm of other specified arteries: Secondary | ICD-10-CM | POA: Diagnosis not present

## 2022-07-28 DIAGNOSIS — I7123 Aneurysm of the descending thoracic aorta, without rupture: Secondary | ICD-10-CM | POA: Diagnosis not present

## 2022-08-08 ENCOUNTER — Other Ambulatory Visit: Payer: Self-pay | Admitting: Internal Medicine

## 2022-08-08 DIAGNOSIS — Z1231 Encounter for screening mammogram for malignant neoplasm of breast: Secondary | ICD-10-CM

## 2022-09-08 DIAGNOSIS — I2510 Atherosclerotic heart disease of native coronary artery without angina pectoris: Secondary | ICD-10-CM | POA: Diagnosis not present

## 2022-09-08 DIAGNOSIS — N281 Cyst of kidney, acquired: Secondary | ICD-10-CM | POA: Diagnosis not present

## 2022-09-08 DIAGNOSIS — I7162 Paravisceral aneurysm of the thoracoabdominal aorta, without rupture: Secondary | ICD-10-CM | POA: Diagnosis not present

## 2022-09-08 DIAGNOSIS — Z95828 Presence of other vascular implants and grafts: Secondary | ICD-10-CM | POA: Diagnosis not present

## 2022-09-08 DIAGNOSIS — E785 Hyperlipidemia, unspecified: Secondary | ICD-10-CM | POA: Diagnosis not present

## 2022-09-08 DIAGNOSIS — I7123 Aneurysm of the descending thoracic aorta, without rupture: Secondary | ICD-10-CM | POA: Diagnosis not present

## 2022-09-08 DIAGNOSIS — I10 Essential (primary) hypertension: Secondary | ICD-10-CM | POA: Diagnosis not present

## 2022-09-08 DIAGNOSIS — I714 Abdominal aortic aneurysm, without rupture, unspecified: Secondary | ICD-10-CM | POA: Diagnosis not present

## 2022-09-08 DIAGNOSIS — Z87891 Personal history of nicotine dependence: Secondary | ICD-10-CM | POA: Diagnosis not present

## 2022-09-08 DIAGNOSIS — D170 Benign lipomatous neoplasm of skin and subcutaneous tissue of head, face and neck: Secondary | ICD-10-CM | POA: Diagnosis not present

## 2022-09-20 DIAGNOSIS — E559 Vitamin D deficiency, unspecified: Secondary | ICD-10-CM | POA: Diagnosis not present

## 2022-09-20 DIAGNOSIS — Z1211 Encounter for screening for malignant neoplasm of colon: Secondary | ICD-10-CM | POA: Diagnosis not present

## 2022-09-20 DIAGNOSIS — R7989 Other specified abnormal findings of blood chemistry: Secondary | ICD-10-CM | POA: Diagnosis not present

## 2022-09-20 DIAGNOSIS — I700 Atherosclerosis of aorta: Secondary | ICD-10-CM | POA: Diagnosis not present

## 2022-09-20 DIAGNOSIS — E7800 Pure hypercholesterolemia, unspecified: Secondary | ICD-10-CM | POA: Diagnosis not present

## 2022-09-27 DIAGNOSIS — Z1331 Encounter for screening for depression: Secondary | ICD-10-CM | POA: Diagnosis not present

## 2022-09-27 DIAGNOSIS — Z95828 Presence of other vascular implants and grafts: Secondary | ICD-10-CM | POA: Diagnosis not present

## 2022-09-27 DIAGNOSIS — M1990 Unspecified osteoarthritis, unspecified site: Secondary | ICD-10-CM | POA: Diagnosis not present

## 2022-09-27 DIAGNOSIS — R82998 Other abnormal findings in urine: Secondary | ICD-10-CM | POA: Diagnosis not present

## 2022-09-27 DIAGNOSIS — I700 Atherosclerosis of aorta: Secondary | ICD-10-CM | POA: Diagnosis not present

## 2022-09-27 DIAGNOSIS — Z1339 Encounter for screening examination for other mental health and behavioral disorders: Secondary | ICD-10-CM | POA: Diagnosis not present

## 2022-09-27 DIAGNOSIS — E559 Vitamin D deficiency, unspecified: Secondary | ICD-10-CM | POA: Diagnosis not present

## 2022-09-27 DIAGNOSIS — I1310 Hypertensive heart and chronic kidney disease without heart failure, with stage 1 through stage 4 chronic kidney disease, or unspecified chronic kidney disease: Secondary | ICD-10-CM | POA: Diagnosis not present

## 2022-09-27 DIAGNOSIS — I6529 Occlusion and stenosis of unspecified carotid artery: Secondary | ICD-10-CM | POA: Diagnosis not present

## 2022-09-27 DIAGNOSIS — Z0000 Encounter for general adult medical examination without abnormal findings: Secondary | ICD-10-CM | POA: Diagnosis not present

## 2022-09-27 DIAGNOSIS — D692 Other nonthrombocytopenic purpura: Secondary | ICD-10-CM | POA: Diagnosis not present

## 2022-09-27 DIAGNOSIS — I712 Thoracic aortic aneurysm, without rupture, unspecified: Secondary | ICD-10-CM | POA: Diagnosis not present

## 2022-09-27 DIAGNOSIS — N182 Chronic kidney disease, stage 2 (mild): Secondary | ICD-10-CM | POA: Diagnosis not present

## 2022-09-27 DIAGNOSIS — Z952 Presence of prosthetic heart valve: Secondary | ICD-10-CM | POA: Diagnosis not present

## 2022-09-27 DIAGNOSIS — E7800 Pure hypercholesterolemia, unspecified: Secondary | ICD-10-CM | POA: Diagnosis not present

## 2022-11-07 ENCOUNTER — Ambulatory Visit
Admission: RE | Admit: 2022-11-07 | Discharge: 2022-11-07 | Disposition: A | Discharge status: Home / Self Care | Payer: PPO | Source: Ambulatory Visit | Attending: Internal Medicine | Admitting: Internal Medicine

## 2022-11-07 DIAGNOSIS — Z1231 Encounter for screening mammogram for malignant neoplasm of breast: Secondary | ICD-10-CM

## 2022-12-13 DIAGNOSIS — H40013 Open angle with borderline findings, low risk, bilateral: Secondary | ICD-10-CM | POA: Diagnosis not present

## 2022-12-13 DIAGNOSIS — H40053 Ocular hypertension, bilateral: Secondary | ICD-10-CM | POA: Diagnosis not present

## 2023-01-24 DIAGNOSIS — Z01818 Encounter for other preprocedural examination: Secondary | ICD-10-CM | POA: Diagnosis not present

## 2023-01-24 DIAGNOSIS — R918 Other nonspecific abnormal finding of lung field: Secondary | ICD-10-CM | POA: Diagnosis not present

## 2023-01-24 DIAGNOSIS — I774 Celiac artery compression syndrome: Secondary | ICD-10-CM | POA: Diagnosis not present

## 2023-01-24 DIAGNOSIS — I2510 Atherosclerotic heart disease of native coronary artery without angina pectoris: Secondary | ICD-10-CM | POA: Diagnosis not present

## 2023-01-24 DIAGNOSIS — Z95828 Presence of other vascular implants and grafts: Secondary | ICD-10-CM | POA: Diagnosis not present

## 2023-01-24 DIAGNOSIS — I10 Essential (primary) hypertension: Secondary | ICD-10-CM | POA: Diagnosis not present

## 2023-01-24 DIAGNOSIS — I771 Stricture of artery: Secondary | ICD-10-CM | POA: Diagnosis not present

## 2023-01-24 DIAGNOSIS — I77811 Abdominal aortic ectasia: Secondary | ICD-10-CM | POA: Diagnosis not present

## 2023-01-24 DIAGNOSIS — I716 Thoracoabdominal aortic aneurysm, without rupture, unspecified: Secondary | ICD-10-CM | POA: Diagnosis not present

## 2023-02-14 DIAGNOSIS — Z79899 Other long term (current) drug therapy: Secondary | ICD-10-CM | POA: Diagnosis not present

## 2023-02-14 DIAGNOSIS — I10 Essential (primary) hypertension: Secondary | ICD-10-CM | POA: Diagnosis not present

## 2023-02-14 DIAGNOSIS — Z7982 Long term (current) use of aspirin: Secondary | ICD-10-CM | POA: Diagnosis not present

## 2023-02-14 DIAGNOSIS — Z882 Allergy status to sulfonamides status: Secondary | ICD-10-CM | POA: Diagnosis not present

## 2023-02-14 DIAGNOSIS — I716 Thoracoabdominal aortic aneurysm, without rupture, unspecified: Secondary | ICD-10-CM | POA: Diagnosis not present

## 2023-04-18 ENCOUNTER — Other Ambulatory Visit: Payer: Self-pay | Admitting: Cardiovascular Disease

## 2023-04-18 DIAGNOSIS — I10 Essential (primary) hypertension: Secondary | ICD-10-CM

## 2023-04-19 DIAGNOSIS — M5136 Other intervertebral disc degeneration, lumbar region: Secondary | ICD-10-CM | POA: Diagnosis not present

## 2023-04-19 DIAGNOSIS — M47816 Spondylosis without myelopathy or radiculopathy, lumbar region: Secondary | ICD-10-CM | POA: Diagnosis not present

## 2023-04-19 DIAGNOSIS — M48061 Spinal stenosis, lumbar region without neurogenic claudication: Secondary | ICD-10-CM | POA: Diagnosis not present

## 2023-04-19 DIAGNOSIS — M47817 Spondylosis without myelopathy or radiculopathy, lumbosacral region: Secondary | ICD-10-CM | POA: Diagnosis not present

## 2023-04-19 DIAGNOSIS — M4807 Spinal stenosis, lumbosacral region: Secondary | ICD-10-CM | POA: Diagnosis not present

## 2023-04-19 MED ORDER — ATORVASTATIN CALCIUM 20 MG PO TABS: 20.0000 mg | ORAL_TABLET | Freq: Every day | ORAL | 0 refills | Status: DC

## 2023-05-03 ENCOUNTER — Encounter: Payer: Self-pay | Admitting: Physician Assistant

## 2023-05-03 NOTE — Progress Notes (Signed)
Cardiology Office Note    Date:  05/05/2023  ID:  Nancy Montoya, DOB 1946-03-21, MRN [id number] PCP:  Gaspar Garbe, MD  Cardiologist:  Verne Carrow, MD  Electrophysiologist:  None   Chief Complaint: f/u aortic valve repair  History of Present Illness: .    Nancy Montoya is a 77 y.o. female with visit-pertinent history of HTN, thoracic aortic aneurysm repair, aortic valve disease, anemia, RBBB presents for annual follow-up, last OV 04/2022. Chart reviewed. Dr. Clifton James met her in 2018 to discuss pre-procedure cath before planned replacement of her aorta. Cardiac cath 10/11/16 with no evidence of CAD. She underwent replacement of the aortic arch with aorto-innominate and aorto-left carotid bypass with Bentall procedure using Medtronic 21 mm Freestyle porcine root in Jnauary 2018. She then underwent left carotid to left subclavian bypass with endovascular repair of the descending thoracic aortic aneurysm in November 2018. She had attempted endoleak repair of thoracic aortic graft June 2020 per interventional radiology. She was previously followed by Dr. Myra Gianotti here in [place] but he referred her out to vascular surgery at [facility]. Their note yesterday states "She was previously scheduled to undergo FEVAR to repair enlarged aneurysmal sac at the distal TEVAR repair. After an unsuccessful lumbar drain placement on 02/14/2023 she spoke to Dr. Pattricia Boss and decided to reschedule [facility] after a clinic appointment. Based on recent MRI, there is evidence of narrowing in her spinal cord making her high risk for spinal cord complications from drain placement during the procedure." CareEverywhere demonstrates that they're following her by CT and carotid duplex. Last echo in our system 07/2021 showed EF 60-65%, mild LAE, mild MR, well-seated 21 mm medtronic freestyle bioprosthetic aortic valve with trivial AI, s/p Bentall "There is flow noted from the main pulmonary artery to the aortic root which was seen on  previous study in 2019 but appears to be worse on this study. May need further testing with TEE/or Cardiac CT for clarification." Result note indicates Dr. Clifton James felt echo was stable.   From cardiac standpoint she reports she's doing well without any cardiac complaints. No CP, SOB, palpitations, edema, syncope. She reports home BP running 115-130s. Today 148/90 with recheck 132/80. She did not yet take her AM BP medications. Overall she is pleased with how she is feeling. She has not eaten yet today.   Labwork independently reviewed: 09/2022 Cr 1.1 2020 Hgb 11.1, plt OK  ROS: .    Please see the history of present illness. Has chronic back pain issues, no acute changes. All other systems are reviewed and otherwise negative.  Studies Reviewed: Marland Kitchen    EKG:  EKG is ordered today, personally reviewed, demonstrating NSR, LAD, RBBB, nonspecific STT changes, artifact present  CV Studies: Cardiac studies reviewed are outlined and summarized above. Otherwise please see EMR for full report.   Current Reported Medications:.    Current Meds  Medication Sig   amLODipine (NORVASC) 5 MG tablet TAKE 1 TABLET (5 MG TOTAL) BY MOUTH DAILY.   aspirin EC 81 MG tablet Take 81 mg by mouth daily.   atorvastatin (LIPITOR) 20 MG tablet Take 1 tablet (20 mg total) by mouth daily.   carvedilol (COREG) 12.5 MG tablet TAKE 1 TABLET BY MOUTH 2 TIMES DAILY.   olmesartan (BENICAR) 40 MG tablet TAKE 1 TABLET BY MOUTH EVERY DAY    Physical Exam:    VS:  BP (!) 148/90   Pulse 75   Ht 5\' 2"  (1.575 m)   Wt 142 lb (  64.4 kg)   SpO2 96%   BMI 25.97 kg/m    Wt Readings from Last 3 Encounters:  05/05/23 142 lb (64.4 kg)  06/27/22 149 lb (67.6 kg)  04/25/22 149 lb 6.4 oz (67.8 kg)    GEN: Well nourished, well developed in no acute distress NECK: No JVD; No carotid bruits CARDIAC:RRR, soft SEM no rubs or gallops RESPIRATORY:  Clear to auscultation without rales, wheezing or rhonchi  ABDOMEN: Soft, non-tender,  non-distended EXTREMITIES:  No edema; No acute deformity   Asessement and Plan:.    1. Thoracic aortic aneurysm - at this juncture she reports this is being actively followed by vascular surgery at [facility] given issues noted above. She will continue to follow with them closely. Remains on ASA, BB.  2. Aortic valve disease - s/p AVR with bioprosthetic AVR (Bentall procedure). SBE ppx. She reports historically her dentist prescribed but would like [place] to send in rx this time - amoxicillin 500mg  4 tabs 30-60 min prior to any dental work. I discussed her last echo report from 2022 with Dr. Clifton James given commentary of needing possible cardiac CT vs TEE. He suggests we repeat the echo here with a structural reader to review. When echo results, would recommend to discuss findings with Dr. Clifton James. Will also get f/u CMET/lipids today.  3. Essential HTN  - mildly elevated BP noted in clinic with recheck only borderline elevated. She reports home readings are OK. We will refill BP meds today (amlodipine, carvedilol, olmesartan) and update Cr/K/TSH. We need a better idea of what it's running at home. The patient was provided instructions on monitoring blood pressure at home for the next week and relaying results to our office on 05/15/23. Would be reasonable to increase amlodipine as the next step.  4. Mild anemia - recheck CBC today for baseline.   5. RBBB - noted on EKG today - this might be intermittent since EKG last year actually showed more narrow QRS, but previous years demonstrated RBBB. No acute concerns today. Continue to follow for any progression of conduction disease.     Disposition: F/u with Dr. Clifton James in 1 year, sooner if needed.  Signed, Laurann Montana, PA-C

## 2023-05-04 DIAGNOSIS — R931 Abnormal findings on diagnostic imaging of heart and coronary circulation: Secondary | ICD-10-CM | POA: Diagnosis not present

## 2023-05-04 DIAGNOSIS — I719 Aortic aneurysm of unspecified site, without rupture: Secondary | ICD-10-CM | POA: Diagnosis not present

## 2023-05-05 ENCOUNTER — Encounter: Payer: Self-pay | Admitting: Physician Assistant

## 2023-05-05 ENCOUNTER — Ambulatory Visit: Payer: PPO | Attending: Physician Assistant | Admitting: Physician Assistant

## 2023-05-05 VITALS — BP 132/80 | HR 75 | Ht 62.0 in | Wt 142.0 lb

## 2023-05-05 DIAGNOSIS — I712 Thoracic aortic aneurysm, without rupture, unspecified: Secondary | ICD-10-CM | POA: Diagnosis not present

## 2023-05-05 DIAGNOSIS — I4510 Unspecified right bundle-branch block: Secondary | ICD-10-CM

## 2023-05-05 DIAGNOSIS — D649 Anemia, unspecified: Secondary | ICD-10-CM

## 2023-05-05 DIAGNOSIS — I359 Nonrheumatic aortic valve disorder, unspecified: Secondary | ICD-10-CM

## 2023-05-05 DIAGNOSIS — I10 Essential (primary) hypertension: Secondary | ICD-10-CM | POA: Diagnosis not present

## 2023-05-05 MED ORDER — ATORVASTATIN CALCIUM 20 MG PO TABS: 20.0000 mg | ORAL_TABLET | Freq: Every day | ORAL | 0 refills | Status: DC

## 2023-05-05 MED ORDER — AMOXICILLIN 500 MG PO CAPS: 2000.0000 mg | ORAL_CAPSULE | ORAL | 12 refills | Status: AC

## 2023-05-05 MED ORDER — OLMESARTAN MEDOXOMIL 40 MG PO TABS: 40.0000 mg | ORAL_TABLET | Freq: Every day | ORAL | 0 refills | Status: DC

## 2023-05-05 MED ORDER — AMLODIPINE BESYLATE 5 MG PO TABS: 5.0000 mg | ORAL_TABLET | Freq: Every day | ORAL | 0 refills | Status: DC

## 2023-05-05 MED ORDER — CARVEDILOL 12.5 MG PO TABS: 12.5000 mg | ORAL_TABLET | Freq: Two times a day (BID) | ORAL | 0 refills | Status: DC

## 2023-05-05 NOTE — Patient Instructions (Addendum)
Medication Instructions:  Your physician recommends that you continue on your current medications as directed. Please refer to the Current Medication list given to you today.   *If you need a refill on your cardiac medications before your next appointment, please call your pharmacy*   Lab Work: CMET, CBC, Lipids, TSH If you have labs (blood work) drawn today and your tests are completely normal, you will receive your results only by: MyChart Message (if you have MyChart) OR A paper copy in the mail If you have any lab test that is abnormal or we need to change your treatment, we will call you to review the results.   Testing/Procedures: ECHO   Follow-Up: At [facility], you and your health needs are our priority.  As part of our continuing mission to provide you with exceptional heart care, we have created designated Provider Care Teams.  These Care Teams include your primary Cardiologist (physician) and Advanced Practice Providers (APPs -  Physician Assistants and Nurse Practitioners) who all work together to provide you with the care you need, when you need it.  We recommend signing up for the patient portal called "MyChart".  Sign up information is provided on this After Visit Summary.  MyChart is used to connect with patients for Virtual Visits (Telemedicine).  Patients are able to view lab/test results, encounter notes, upcoming appointments, etc.  Non-urgent messages can be sent to your provider as well.   To learn more about what you can do with MyChart, go to [email or web address].    Your next appointment:   1 year(s)  Provider:   Verne Carrow, MD     Other Instructions     Endocarditis Information  You may be at risk for developing endocarditis since you have an artificial heart valve or a repaired heart valve. Endocarditis is an infection of the lining of the heart or heart valves. Certain surgical and dental procedures may put you at risk, such as  teeth cleaning or other dental procedures or other medical procedures. Notify our office or your dentist before having any dental work or invasive/surgical procedures. You will need to take antibiotics before certain procedures. To prevent endocarditis, maintain good oral health. Seek prompt medical attention for any mouth/gum, skin or urinary tract infections.  We need to get a better idea of what your blood pressure is running at home. Here are some instructions to follow: - I would recommend using a blood pressure cuff that goes on your arm. The wrist ones can be inaccurate. If you're purchasing one for the first time, try to select one that also reports your heart rate because this can be helpful information as well. - To check your blood pressure, choose a time at least 3 hours after taking your blood pressure medicines. If you can sample it at different times of the day, that's great - it might give you more information about how your blood pressure fluctuates. Remain seated in a chair for 5 minutes quietly beforehand, then check it.  - Please record a list of those readings and call us/send in MyChart message with them for our review on 05/15/23.

## 2023-05-25 ENCOUNTER — Ambulatory Visit (HOSPITAL_COMMUNITY): Payer: PPO | Attending: Physician Assistant

## 2023-05-25 DIAGNOSIS — I712 Thoracic aortic aneurysm, without rupture, unspecified: Secondary | ICD-10-CM

## 2023-05-25 DIAGNOSIS — D649 Anemia, unspecified: Secondary | ICD-10-CM

## 2023-05-25 DIAGNOSIS — I4510 Unspecified right bundle-branch block: Secondary | ICD-10-CM | POA: Diagnosis not present

## 2023-05-25 DIAGNOSIS — I10 Essential (primary) hypertension: Secondary | ICD-10-CM | POA: Diagnosis not present

## 2023-05-25 DIAGNOSIS — I359 Nonrheumatic aortic valve disorder, unspecified: Secondary | ICD-10-CM | POA: Diagnosis not present

## 2023-05-25 LAB — ECHOCARDIOGRAM COMPLETE
AV Mean grad: 7 mmHg
AV Peak grad: 12.7 mmHg
Ao pk vel: 1.78 m/s
Area-P 1/2: 4.54 cm2
S' Lateral: 2.7 cm

## 2023-06-15 DIAGNOSIS — H40013 Open angle with borderline findings, low risk, bilateral: Secondary | ICD-10-CM | POA: Diagnosis not present

## 2023-06-15 DIAGNOSIS — H40053 Ocular hypertension, bilateral: Secondary | ICD-10-CM | POA: Diagnosis not present

## 2023-06-17 ENCOUNTER — Other Ambulatory Visit: Payer: Self-pay | Admitting: Physician Assistant

## 2023-07-04 DIAGNOSIS — Z23 Encounter for immunization: Secondary | ICD-10-CM | POA: Diagnosis not present

## 2023-08-10 ENCOUNTER — Encounter: Payer: Self-pay | Admitting: Internal Medicine

## 2023-10-10 DIAGNOSIS — E7800 Pure hypercholesterolemia, unspecified: Secondary | ICD-10-CM | POA: Diagnosis not present

## 2023-10-10 DIAGNOSIS — E559 Vitamin D deficiency, unspecified: Secondary | ICD-10-CM | POA: Diagnosis not present

## 2023-10-10 DIAGNOSIS — I10 Essential (primary) hypertension: Secondary | ICD-10-CM | POA: Diagnosis not present

## 2023-10-11 ENCOUNTER — Other Ambulatory Visit: Payer: Self-pay | Admitting: Internal Medicine

## 2023-10-11 DIAGNOSIS — Z1231 Encounter for screening mammogram for malignant neoplasm of breast: Secondary | ICD-10-CM

## 2023-10-15 DIAGNOSIS — Z1212 Encounter for screening for malignant neoplasm of rectum: Secondary | ICD-10-CM | POA: Diagnosis not present

## 2023-10-16 DIAGNOSIS — M1990 Unspecified osteoarthritis, unspecified site: Secondary | ICD-10-CM | POA: Diagnosis not present

## 2023-10-16 DIAGNOSIS — I700 Atherosclerosis of aorta: Secondary | ICD-10-CM | POA: Diagnosis not present

## 2023-10-16 DIAGNOSIS — Z0000 Encounter for general adult medical examination without abnormal findings: Secondary | ICD-10-CM | POA: Diagnosis not present

## 2023-10-16 DIAGNOSIS — I129 Hypertensive chronic kidney disease with stage 1 through stage 4 chronic kidney disease, or unspecified chronic kidney disease: Secondary | ICD-10-CM | POA: Diagnosis not present

## 2023-10-16 DIAGNOSIS — M8580 Other specified disorders of bone density and structure, unspecified site: Secondary | ICD-10-CM | POA: Diagnosis not present

## 2023-10-16 DIAGNOSIS — D692 Other nonthrombocytopenic purpura: Secondary | ICD-10-CM | POA: Diagnosis not present

## 2023-10-16 DIAGNOSIS — Z23 Encounter for immunization: Secondary | ICD-10-CM | POA: Diagnosis not present

## 2023-10-16 DIAGNOSIS — I712 Thoracic aortic aneurysm, without rupture, unspecified: Secondary | ICD-10-CM | POA: Diagnosis not present

## 2023-10-16 DIAGNOSIS — Z952 Presence of prosthetic heart valve: Secondary | ICD-10-CM | POA: Diagnosis not present

## 2023-10-16 DIAGNOSIS — R82998 Other abnormal findings in urine: Secondary | ICD-10-CM | POA: Diagnosis not present

## 2023-10-16 DIAGNOSIS — Z1331 Encounter for screening for depression: Secondary | ICD-10-CM | POA: Diagnosis not present

## 2023-10-16 DIAGNOSIS — E7800 Pure hypercholesterolemia, unspecified: Secondary | ICD-10-CM | POA: Diagnosis not present

## 2023-10-16 DIAGNOSIS — Z95828 Presence of other vascular implants and grafts: Secondary | ICD-10-CM | POA: Diagnosis not present

## 2023-10-16 DIAGNOSIS — I6529 Occlusion and stenosis of unspecified carotid artery: Secondary | ICD-10-CM | POA: Diagnosis not present

## 2023-10-16 DIAGNOSIS — N182 Chronic kidney disease, stage 2 (mild): Secondary | ICD-10-CM | POA: Diagnosis not present

## 2023-10-16 DIAGNOSIS — Z1339 Encounter for screening examination for other mental health and behavioral disorders: Secondary | ICD-10-CM | POA: Diagnosis not present

## 2023-11-09 ENCOUNTER — Ambulatory Visit
Admission: RE | Admit: 2023-11-09 | Discharge: 2023-11-09 | Disposition: A | Discharge status: Home / Self Care | Payer: PPO | Source: Ambulatory Visit | Attending: Internal Medicine | Admitting: Internal Medicine

## 2023-11-09 DIAGNOSIS — Z1231 Encounter for screening mammogram for malignant neoplasm of breast: Secondary | ICD-10-CM

## 2023-11-16 DIAGNOSIS — Z885 Allergy status to narcotic agent status: Secondary | ICD-10-CM | POA: Diagnosis not present

## 2023-11-16 DIAGNOSIS — Z87891 Personal history of nicotine dependence: Secondary | ICD-10-CM | POA: Diagnosis not present

## 2023-11-16 DIAGNOSIS — Z7982 Long term (current) use of aspirin: Secondary | ICD-10-CM | POA: Diagnosis not present

## 2023-11-16 DIAGNOSIS — Z882 Allergy status to sulfonamides status: Secondary | ICD-10-CM | POA: Diagnosis not present

## 2023-11-16 DIAGNOSIS — I714 Abdominal aortic aneurysm, without rupture, unspecified: Secondary | ICD-10-CM | POA: Diagnosis not present

## 2023-11-16 DIAGNOSIS — I716 Thoracoabdominal aortic aneurysm, without rupture, unspecified: Secondary | ICD-10-CM | POA: Diagnosis not present

## 2023-11-16 DIAGNOSIS — I10 Essential (primary) hypertension: Secondary | ICD-10-CM | POA: Diagnosis not present

## 2023-11-16 DIAGNOSIS — Z91040 Latex allergy status: Secondary | ICD-10-CM | POA: Diagnosis not present

## 2023-11-16 DIAGNOSIS — Z79899 Other long term (current) drug therapy: Secondary | ICD-10-CM | POA: Diagnosis not present

## 2023-11-16 DIAGNOSIS — R931 Abnormal findings on diagnostic imaging of heart and coronary circulation: Secondary | ICD-10-CM | POA: Diagnosis not present

## 2023-12-19 DIAGNOSIS — H40053 Ocular hypertension, bilateral: Secondary | ICD-10-CM | POA: Diagnosis not present

## 2023-12-19 DIAGNOSIS — H40013 Open angle with borderline findings, low risk, bilateral: Secondary | ICD-10-CM | POA: Diagnosis not present

## 2024-01-12 DIAGNOSIS — B0229 Other postherpetic nervous system involvement: Secondary | ICD-10-CM | POA: Diagnosis not present

## 2024-01-12 DIAGNOSIS — L299 Pruritus, unspecified: Secondary | ICD-10-CM | POA: Diagnosis not present

## 2024-01-12 DIAGNOSIS — B029 Zoster without complications: Secondary | ICD-10-CM | POA: Diagnosis not present

## 2024-01-12 DIAGNOSIS — R21 Rash and other nonspecific skin eruption: Secondary | ICD-10-CM | POA: Diagnosis not present

## 2024-05-16 DIAGNOSIS — I716 Thoracoabdominal aortic aneurysm, without rupture, unspecified: Secondary | ICD-10-CM | POA: Diagnosis not present

## 2024-05-16 DIAGNOSIS — I714 Abdominal aortic aneurysm, without rupture, unspecified: Secondary | ICD-10-CM | POA: Diagnosis not present

## 2024-05-16 DIAGNOSIS — I712 Thoracic aortic aneurysm, without rupture, unspecified: Secondary | ICD-10-CM | POA: Diagnosis not present

## 2024-06-07 ENCOUNTER — Other Ambulatory Visit: Payer: Self-pay | Admitting: Physician Assistant

## 2024-06-20 DIAGNOSIS — H40013 Open angle with borderline findings, low risk, bilateral: Secondary | ICD-10-CM | POA: Diagnosis not present

## 2024-06-26 NOTE — Progress Notes (Addendum)
 Nancy Montoya                                          MRN: [id number]   10/10/2024   The [facility] Quality Team Specialist reviewed this patient medical record for the purposes of chart review for care gap closure. The following were reviewed: chart review for care gap closure-controlling blood pressure.    VBCI Quality Team

## 2024-09-04 NOTE — Progress Notes (Unsigned)
 No chief complaint on file.   History of Present Illness: 78 yo female with history of HTN, thoracic aortic aneurysm s/p repair and AVR, RBBB who is here today for cardiac follow up. I met her in January 2018 to discuss a cardiac cath which was required as part of her pre-operative workup before planned replacement of her aorta. Cardiac cath 10/11/16 with no evidence of CAD. She underwent replacement of the aortic arch with aorto-innominate and aorto-left carotid bypass with Bentall procedure using Medtronic 21 mm Freestyle porcine root on 10/20/16. She then underwent left carotid to left subclavian bypass with endovascular repair of the descending thoracic aortic aneurysm in November 2018. She has done well since then. Endoleak repair of thoracic aortic graft June 2020 per interventional radiology. She had been followed here in Vascular surgery by Dr. Serene but is now followed at [facility] Echo August 2024 with LVEF=60-65%. Mild RV dysfunction. The bioprosthetic AVR was working well. Trivial MR.   She is here today for follow up. The patient denies any chest pain, dyspnea, palpitations, lower extremity edema, orthopnea, PND, dizziness, near syncope or syncope.   Primary Care Physician: Vernadine Charlie ORN, MD  Past Medical History:  Diagnosis Date   Anemia    Aortic valve disease    Arthritis    Blood transfusion without reported diagnosis 2009   anemia   Constipation    Hypertension    Pneumonia    hx recent   Thoracic aortic aneurysm without rupture     Past Surgical History:  Procedure Laterality Date   BENTALL PROCEDURE N/A 10/20/2016   Procedure: BIOLOGIC BENTALL PROCEDURE -21 mm Medtronic Freestyle Porcine Valve Conduit;  Surgeon: Dorise MARLA Fellers, MD;  Location: [facility] OR;  Service: Open Heart Surgery;  Laterality: N/A;  RIGHT AXILLARY ARTERY CANNULATION  BILATERAL RADIAL ARTERIAL LINES   CARDIAC CATHETERIZATION N/A 10/11/2016   Procedure: Right/Left Heart Cath and Coronary Angiography;   Surgeon: Lonni JONETTA Cash, MD;  Location: [facility] INVASIVE CV LAB;  Service: Cardiovascular;  Laterality: N/A;   CAROTID-SUBCLAVIAN BYPASS GRAFT Left 08/17/2017   Procedure: BYPASS HEMASHIELD GOLD GRAFT 7mm x 30cm CAROTID-SUBCLAVIAN;  Surgeon: Serene Gaile ORN, MD;  Location: [facility] OR;  Service: Vascular;  Laterality: Left;   COLONOSCOPY     EMBOLIZATION  03/08/2019    RADIOLOGY WITH ANESTHESIA EMBOLIZATION    IR AORTA/THORACIC  03/08/2019   IR CT SPINE LTD  03/08/2019   IR EMBO VENOUS NOT HEMORR HEMANG  INC GUIDE ROADMAPPING  03/08/2019   IR RADIOLOGIST EVAL & MGMT  11/08/2018   IR RADIOLOGIST EVAL & MGMT  10/30/2019   RADIOLOGY WITH ANESTHESIA N/A 03/08/2019   Procedure: RADIOLOGY WITH ANESTHESIA EMBOLIZATION;  Surgeon: Karalee Beat, MD;  Location: [facility] OR;  Service: Radiology;  Laterality: N/A;   REPLACEMENT ASCENDING AORTA N/A 10/20/2016   Procedure: REPLACEMENT ASCENDING AORTA WITH ARCH RECONSTRUCTION-Aorto-Inominate bypass, Aorto-Carotid Bypass.. With Hypothermic circulatory arrest;  Surgeon: Dorise MARLA Fellers, MD;  Location: [facility] OR;  Service: Open Heart Surgery;  Laterality: N/A;   TEE WITHOUT CARDIOVERSION N/A 10/20/2016   Procedure: TRANSESOPHAGEAL ECHOCARDIOGRAM (TEE);  Surgeon: Dorise MARLA Fellers, MD;  Location: [facility] OR;  Service: Open Heart Surgery;  Laterality: N/A;   THORACIC AORTIC ENDOVASCULAR STENT GRAFT N/A 08/17/2017   Procedure: THORACIC AORTIC ENDOVASCULAR STENT GRAFT;  Surgeon: Serene Gaile ORN, MD;  Location: [facility] OR;  Service: Vascular;  Laterality: N/A;   TUBAL LIGATION  1976    Current Outpatient Medications  Medication Sig Dispense Refill   amLODipine  (  NORVASC ) 5 MG tablet TAKE 1 TABLET (5 MG TOTAL) BY MOUTH DAILY. 30 tablet 0   amoxicillin  (AMOXIL ) 500 MG capsule Take 4 capsules (2,000 mg total) by mouth as directed. Take one hour before any dental work including cleanings 12 capsule 12   aspirin  EC 81 MG tablet Take 81 mg by mouth daily.     atorvastatin  (LIPITOR) 20 MG tablet TAKE 1  TABLET BY MOUTH EVERY DAY 30 tablet 0   carvedilol  (COREG ) 12.5 MG tablet TAKE 1 TABLET BY MOUTH TWICE A DAY 60 tablet 0   olmesartan  (BENICAR ) 40 MG tablet TAKE 1 TABLET BY MOUTH EVERY DAY 30 tablet 0   No current facility-administered medications for this visit.    Allergies  Allergen Reactions   Codeine Other (See Comments)    hallucinations   Latex Rash   Sulfa Antibiotics Rash    Social History   Socioeconomic History   Marital status: Married    Spouse name: Not on file   Number of children: 1   Years of education: Not on file   Highest education level: Not on file  Occupational History   Occupation: Semi Retired-housework  Tobacco Use   Smoking status: Former    Current packs/day: 0.00    Average packs/day: 0.5 packs/day for 40.0 years (20.0 ttl pk-yrs)    Types: Cigarettes    Start date: 10/06/1968    Quit date: 06/22/2008    Years since quitting: 16.2   Smokeless tobacco: Never  Vaping Use   Vaping status: Never Used  Substance and Sexual Activity   Alcohol  use: No   Drug use: No   Sexual activity: Not on file  Other Topics Concern   Not on file  Social History Narrative   Not on file   Social Drivers of Health   Financial Resource Strain: Not on file  Food Insecurity: Not on file  Transportation Needs: Not on file  Physical Activity: Not on file  Stress: Not on file  Social Connections: Not on file  Intimate Partner Violence: Not on file    Family History  Problem Relation Age of Onset   Colon cancer Neg Hx     Review of Systems:  As stated in the HPI and otherwise negative.   There were no vitals taken for this visit.  Physical Examination: General: Well developed, well nourished, NAD  HEENT: OP clear, mucus membranes moist  SKIN: warm, dry. No rashes. Neuro: No focal deficits  Musculoskeletal: Muscle strength 5/5 all ext  Psychiatric: Mood and affect normal  Neck: No JVD, no carotid bruits, no thyromegaly, no lymphadenopathy.   Lungs:Clear bilaterally, no wheezes, rhonci, crackles Cardiovascular: Regular rate and rhythm. No murmurs, gallops or rubs. Abdomen:Soft. Bowel sounds present. Non-tender.  Extremities: No lower extremity edema. Pulses are 2 + in the bilateral DP/PT.   EKG:  EKG is *** ordered today. The ekg ordered today demonstrates   Recent Labs: No results found for requested labs within last 365 days.   Lipid Panel    Component Value Date/Time   CHOL 147 05/05/2023 0830   TRIG 61 05/05/2023 0830   HDL 68 05/05/2023 0830   CHOLHDL 2.2 05/05/2023 0830   LDLCALC 66 05/05/2023 0830     Wt Readings from Last 3 Encounters:  05/05/23 142 lb (64.4 kg)  06/27/22 149 lb (67.6 kg)  04/25/22 149 lb 6.4 oz (67.8 kg)     Assessment and Plan:   1. Thoracic aortic aneurysm She is s/p aortic  arch replacement with Bentall procedure and porcine aortic valve replacement in January 2018. Repair of thoracic aortic endograft leak in June 2020. She is doing well. Continue ASA and beta blocker. SBE prophylaxis as needed.   2. HTN: BP is controlled. today. She has a history of white coat HTN. Continue Norvasc , Coreg  and Benicar   3. Aortic valve disease: She is s/p AVR with a bioprosthetic AVR (Bentall procedure) in 2018. Echo August 2024 with normally functioning AVR.  Labs/ tests ordered today include:  No orders of the defined types were placed in this encounter.  Disposition:   Follow up with me in one year.   Signed, Lonni Cash, MD 09/04/2024 12:04 PM    [facility] [address] [place], [place], [place]  [address] Phone: [phone]; Fax: [phone]

## 2024-09-05 ENCOUNTER — Ambulatory Visit: Attending: Cardiovascular Disease | Admitting: Cardiovascular Disease

## 2024-09-05 ENCOUNTER — Encounter: Payer: Self-pay | Admitting: Cardiovascular Disease

## 2024-09-05 VITALS — BP 144/80 | HR 69 | Ht 62.0 in | Wt 140.6 lb

## 2024-09-05 DIAGNOSIS — I712 Thoracic aortic aneurysm, without rupture, unspecified: Secondary | ICD-10-CM | POA: Diagnosis not present

## 2024-09-05 DIAGNOSIS — I359 Nonrheumatic aortic valve disorder, unspecified: Secondary | ICD-10-CM | POA: Diagnosis not present

## 2024-09-05 DIAGNOSIS — I10 Essential (primary) hypertension: Secondary | ICD-10-CM

## 2024-09-05 NOTE — Patient Instructions (Signed)

## 2024-10-07 ENCOUNTER — Other Ambulatory Visit: Payer: Self-pay | Admitting: Internal Medicine

## 2024-10-07 DIAGNOSIS — Z1231 Encounter for screening mammogram for malignant neoplasm of breast: Secondary | ICD-10-CM

## 2024-11-11 ENCOUNTER — Ambulatory Visit

## 2024-11-15 ENCOUNTER — Ambulatory Visit
